# Patient Record
Sex: Male | Born: 1940
Health system: Southern US, Community
[De-identification: ages and names within clinical notes are randomized; demographics above are authoritative.]

## PROBLEM LIST (undated history)

## (undated) DIAGNOSIS — IMO0001 Reserved for inherently not codable concepts without codable children: Secondary | ICD-10-CM

## (undated) DIAGNOSIS — B86 Scabies: Secondary | ICD-10-CM

## (undated) DIAGNOSIS — I10 Essential (primary) hypertension: Secondary | ICD-10-CM

## (undated) DIAGNOSIS — R351 Nocturia: Secondary | ICD-10-CM

## (undated) DIAGNOSIS — R5383 Other fatigue: Secondary | ICD-10-CM

## (undated) DIAGNOSIS — E669 Obesity, unspecified: Secondary | ICD-10-CM

## (undated) DIAGNOSIS — N289 Disorder of kidney and ureter, unspecified: Secondary | ICD-10-CM

## (undated) DIAGNOSIS — I219 Acute myocardial infarction, unspecified: Secondary | ICD-10-CM

## (undated) DIAGNOSIS — J40 Bronchitis, not specified as acute or chronic: Secondary | ICD-10-CM

## (undated) DIAGNOSIS — J189 Pneumonia, unspecified organism: Secondary | ICD-10-CM

## (undated) DIAGNOSIS — K219 Gastro-esophageal reflux disease without esophagitis: Secondary | ICD-10-CM

## (undated) DIAGNOSIS — Z95828 Presence of other vascular implants and grafts: Secondary | ICD-10-CM

## (undated) DIAGNOSIS — M1990 Unspecified osteoarthritis, unspecified site: Secondary | ICD-10-CM

## (undated) DIAGNOSIS — E785 Hyperlipidemia, unspecified: Secondary | ICD-10-CM

## (undated) DIAGNOSIS — R972 Elevated prostate specific antigen [PSA]: Secondary | ICD-10-CM

## (undated) DIAGNOSIS — I509 Heart failure, unspecified: Secondary | ICD-10-CM

## (undated) DIAGNOSIS — R011 Cardiac murmur, unspecified: Secondary | ICD-10-CM

## (undated) DIAGNOSIS — K921 Melena: Secondary | ICD-10-CM

## (undated) HISTORY — DX: Bronchitis, not specified as acute or chronic: J40

## (undated) HISTORY — DX: Scabies: B86

## (undated) HISTORY — DX: Elevated prostate specific antigen (PSA): R97.20

## (undated) HISTORY — PX: TONSILLECTOMY: SUR1361

## (undated) HISTORY — DX: Melena: K92.1

## (undated) HISTORY — DX: Disorder of kidney and ureter, unspecified: N28.9

## (undated) HISTORY — DX: Other fatigue: R53.83

## (undated) HISTORY — DX: Nocturia: R35.1

## (undated) HISTORY — DX: Obesity, unspecified: E66.9

## (undated) HISTORY — DX: Hyperlipidemia, unspecified: E78.5

---

## 2014-06-22 ENCOUNTER — Inpatient Hospital Stay: Payer: Self-pay | Admitting: Internal Medicine

## 2014-06-22 DIAGNOSIS — I351 Nonrheumatic aortic (valve) insufficiency: Secondary | ICD-10-CM | POA: Diagnosis not present

## 2014-06-23 DIAGNOSIS — R079 Chest pain, unspecified: Secondary | ICD-10-CM | POA: Diagnosis not present

## 2014-07-04 LAB — COMPREHENSIVE METABOLIC PANEL
ALBUMIN: 2.6 g/dL — AB
ALK PHOS: 71 U/L
ANION GAP: 8 (ref 7–16)
BILIRUBIN TOTAL: 0.5 mg/dL
BUN: 21 mg/dL — ABNORMAL HIGH
CALCIUM: 8.1 mg/dL — AB
CHLORIDE: 101 mmol/L
CREATININE: 1.54 mg/dL — AB
Co2: 26 mmol/L
EGFR (Non-African Amer.): 44 — ABNORMAL LOW
GFR CALC AF AMER: 51 — AB
GLUCOSE: 139 mg/dL — AB
Potassium: 3.9 mmol/L
SGOT(AST): 35 U/L
SGPT (ALT): 29 U/L
Sodium: 135 mmol/L
TOTAL PROTEIN: 6.5 g/dL

## 2014-07-04 LAB — CK TOTAL AND CKMB (NOT AT ARMC)
CK, TOTAL: 29 U/L — AB
CK-MB: 1.9 ng/mL

## 2014-07-04 LAB — CK-MB
CK-MB: 2.2 ng/mL
CK-MB: 2.1 ng/mL

## 2014-07-04 LAB — PROTIME-INR
INR: 1.3
Prothrombin Time: 16 secs — ABNORMAL HIGH

## 2014-07-04 LAB — CBC
HCT: 28.5 % — ABNORMAL LOW (ref 40.0–52.0)
HGB: 8.9 g/dL — AB (ref 13.0–18.0)
MCH: 24.4 pg — ABNORMAL LOW (ref 26.0–34.0)
MCHC: 31.1 g/dL — AB (ref 32.0–36.0)
MCV: 79 fL — ABNORMAL LOW (ref 80–100)
Platelet: 261 10*3/uL (ref 150–440)
RBC: 3.64 10*6/uL — ABNORMAL LOW (ref 4.40–5.90)
RDW: 16.4 % — AB (ref 11.5–14.5)
WBC: 9.3 10*3/uL (ref 3.8–10.6)

## 2014-07-04 LAB — APTT: ACTIVATED PTT: 38.3 s — AB (ref 23.6–35.9)

## 2014-07-04 LAB — TROPONIN I
TROPONIN-I: 0.07 ng/mL — AB
Troponin-I: 0.08 ng/mL — ABNORMAL HIGH

## 2014-07-04 LAB — MAGNESIUM: Magnesium: 2.1 mg/dL

## 2014-07-05 ENCOUNTER — Inpatient Hospital Stay
Admit: 2014-07-05 | Disposition: A | Discharge status: Other Acute Inpatient Hospital | Payer: Self-pay | Attending: Internal Medicine | Admitting: Internal Medicine

## 2014-07-05 LAB — URINALYSIS, COMPLETE
BACTERIA: NONE SEEN
BILIRUBIN, UR: NEGATIVE
Glucose,UR: NEGATIVE mg/dL (ref 0–75)
Hyaline Cast: 30
KETONE: NEGATIVE
Nitrite: NEGATIVE
Ph: 5 (ref 4.5–8.0)
Protein: 30
RBC,UR: 26 /HPF (ref 0–5)
Specific Gravity: 1.016 (ref 1.003–1.030)

## 2014-07-05 LAB — BASIC METABOLIC PANEL
Anion Gap: 12 (ref 7–16)
BUN: 21 mg/dL — ABNORMAL HIGH
Calcium, Total: 9.2 mg/dL
Chloride: 104 mmol/L
Co2: 19 mmol/L — ABNORMAL LOW
Creatinine: 1.6 mg/dL — ABNORMAL HIGH
EGFR (African American): 48 — ABNORMAL LOW
EGFR (Non-African Amer.): 42 — ABNORMAL LOW
Glucose: 199 mg/dL — ABNORMAL HIGH
Potassium: 3.4 mmol/L — ABNORMAL LOW
Sodium: 135 mmol/L

## 2014-07-05 LAB — TROPONIN I
Troponin-I: 0.28 ng/mL — ABNORMAL HIGH
Troponin-I: 9.09 ng/mL — ABNORMAL HIGH
Troponin-I: 11.49 ng/mL — ABNORMAL HIGH
Troponin-I: 8.8 ng/mL — ABNORMAL HIGH

## 2014-07-05 LAB — LIPID PANEL
CHOLESTEROL: 89 mg/dL
HDL Cholesterol: 19 mg/dL — ABNORMAL LOW
LDL CHOLESTEROL, CALC: 47 mg/dL
Triglycerides: 114 mg/dL
VLDL Cholesterol, Calc: 23 mg/dL

## 2014-07-05 LAB — CK-MB
CK-MB: 5 ng/mL
CK-MB: 102.4 ng/mL — AB
CK-MB: 109.1 ng/mL — AB
CK-MB: 92.7 ng/mL — AB

## 2014-07-05 LAB — CBC WITH DIFFERENTIAL/PLATELET
Basophil #: 0.1 10*3/uL (ref 0.0–0.1)
Basophil %: 0.8 %
Eosinophil #: 0.1 10*3/uL (ref 0.0–0.7)
Eosinophil %: 0.4 %
HCT: 34.2 % — ABNORMAL LOW (ref 40.0–52.0)
HGB: 10.5 g/dL — ABNORMAL LOW (ref 13.0–18.0)
Lymphocyte #: 1.9 10*3/uL (ref 1.0–3.6)
Lymphocyte %: 12.2 %
MCH: 24.3 pg — ABNORMAL LOW (ref 26.0–34.0)
MCHC: 30.7 g/dL — ABNORMAL LOW (ref 32.0–36.0)
MCV: 79 fL — ABNORMAL LOW (ref 80–100)
Monocyte #: 0.3 x10 3/mm (ref 0.2–1.0)
Monocyte %: 1.8 %
Neutrophil #: 13.2 10*3/uL — ABNORMAL HIGH (ref 1.4–6.5)
Neutrophil %: 84.8 %
Platelet: 424 10*3/uL (ref 150–440)
RBC: 4.33 10*6/uL — ABNORMAL LOW (ref 4.40–5.90)
RDW: 16.7 % — ABNORMAL HIGH (ref 11.5–14.5)
WBC: 15.5 10*3/uL — ABNORMAL HIGH (ref 3.8–10.6)

## 2014-07-05 LAB — APTT
Activated PTT: 38.9 secs — ABNORMAL HIGH (ref 23.6–35.9)
Activated PTT: 87 secs — ABNORMAL HIGH (ref 23.6–35.9)

## 2014-07-05 LAB — CK: CK, Total: 37 U/L — ABNORMAL LOW

## 2014-07-05 LAB — POTASSIUM
POTASSIUM: 6.2 mmol/L — AB
POTASSIUM: 4.3 mmol/L

## 2014-07-05 LAB — HEPARIN LEVEL (UNFRACTIONATED): Anti-Xa(Unfractionated): 2 IU/mL — ABNORMAL HIGH (ref 0.30–0.70)

## 2014-07-05 LAB — MAGNESIUM
Magnesium: 2.3 mg/dL
Magnesium: 2.3 mg/dL

## 2014-07-05 LAB — PROTIME-INR
INR: 1.5
Prothrombin Time: 18.6 secs — ABNORMAL HIGH

## 2014-07-05 LAB — PRO B NATRIURETIC PEPTIDE: B-Type Natriuretic Peptide: 363 pg/mL — ABNORMAL HIGH

## 2014-07-06 ENCOUNTER — Inpatient Hospital Stay (HOSPITAL_COMMUNITY): Payer: Commercial Managed Care - HMO

## 2014-07-06 ENCOUNTER — Inpatient Hospital Stay (HOSPITAL_COMMUNITY)
Admission: AD | Admit: 2014-07-06 | Discharge: 2014-07-15 | DRG: 871 | Disposition: A | Discharge status: Home Health | Payer: Commercial Managed Care - HMO | Source: Other Acute Inpatient Hospital | Attending: Internal Medicine | Admitting: Internal Medicine

## 2014-07-06 ENCOUNTER — Encounter (HOSPITAL_COMMUNITY): Payer: Self-pay | Admitting: *Deleted

## 2014-07-06 DIAGNOSIS — T502X5A Adverse effect of carbonic-anhydrase inhibitors, benzothiadiazides and other diuretics, initial encounter: Secondary | ICD-10-CM | POA: Diagnosis present

## 2014-07-06 DIAGNOSIS — K011 Impacted teeth: Secondary | ICD-10-CM | POA: Diagnosis present

## 2014-07-06 DIAGNOSIS — I214 Non-ST elevation (NSTEMI) myocardial infarction: Secondary | ICD-10-CM | POA: Diagnosis present

## 2014-07-06 DIAGNOSIS — K089 Disorder of teeth and supporting structures, unspecified: Secondary | ICD-10-CM | POA: Insufficient documentation

## 2014-07-06 DIAGNOSIS — K5791 Diverticulosis of intestine, part unspecified, without perforation or abscess with bleeding: Secondary | ICD-10-CM | POA: Diagnosis present

## 2014-07-06 DIAGNOSIS — I429 Cardiomyopathy, unspecified: Secondary | ICD-10-CM | POA: Diagnosis present

## 2014-07-06 DIAGNOSIS — R52 Pain, unspecified: Secondary | ICD-10-CM

## 2014-07-06 DIAGNOSIS — L0291 Cutaneous abscess, unspecified: Secondary | ICD-10-CM

## 2014-07-06 DIAGNOSIS — K029 Dental caries, unspecified: Secondary | ICD-10-CM | POA: Diagnosis present

## 2014-07-06 DIAGNOSIS — R55 Syncope and collapse: Secondary | ICD-10-CM | POA: Diagnosis present

## 2014-07-06 DIAGNOSIS — I2510 Atherosclerotic heart disease of native coronary artery without angina pectoris: Secondary | ICD-10-CM | POA: Diagnosis present

## 2014-07-06 DIAGNOSIS — K0530 Chronic periodontitis, unspecified: Secondary | ICD-10-CM | POA: Diagnosis present

## 2014-07-06 DIAGNOSIS — M25561 Pain in right knee: Secondary | ICD-10-CM | POA: Diagnosis present

## 2014-07-06 DIAGNOSIS — J181 Lobar pneumonia, unspecified organism: Secondary | ICD-10-CM

## 2014-07-06 DIAGNOSIS — R7881 Bacteremia: Secondary | ICD-10-CM | POA: Diagnosis present

## 2014-07-06 DIAGNOSIS — B954 Other streptococcus as the cause of diseases classified elsewhere: Secondary | ICD-10-CM | POA: Diagnosis not present

## 2014-07-06 DIAGNOSIS — Z880 Allergy status to penicillin: Secondary | ICD-10-CM | POA: Diagnosis not present

## 2014-07-06 DIAGNOSIS — E785 Hyperlipidemia, unspecified: Secondary | ICD-10-CM | POA: Diagnosis present

## 2014-07-06 DIAGNOSIS — M2570 Osteophyte, unspecified joint: Secondary | ICD-10-CM | POA: Diagnosis present

## 2014-07-06 DIAGNOSIS — I5022 Chronic systolic (congestive) heart failure: Secondary | ICD-10-CM | POA: Diagnosis not present

## 2014-07-06 DIAGNOSIS — J189 Pneumonia, unspecified organism: Secondary | ICD-10-CM | POA: Diagnosis present

## 2014-07-06 DIAGNOSIS — I10 Essential (primary) hypertension: Secondary | ICD-10-CM | POA: Diagnosis present

## 2014-07-06 DIAGNOSIS — I339 Acute and subacute endocarditis, unspecified: Secondary | ICD-10-CM

## 2014-07-06 DIAGNOSIS — E876 Hypokalemia: Secondary | ICD-10-CM | POA: Diagnosis present

## 2014-07-06 DIAGNOSIS — M009 Pyogenic arthritis, unspecified: Secondary | ICD-10-CM | POA: Diagnosis present

## 2014-07-06 DIAGNOSIS — I4891 Unspecified atrial fibrillation: Secondary | ICD-10-CM | POA: Diagnosis present

## 2014-07-06 DIAGNOSIS — E43 Unspecified severe protein-calorie malnutrition: Secondary | ICD-10-CM | POA: Diagnosis present

## 2014-07-06 DIAGNOSIS — D649 Anemia, unspecified: Secondary | ICD-10-CM | POA: Diagnosis present

## 2014-07-06 DIAGNOSIS — Z48812 Encounter for surgical aftercare following surgery on the circulatory system: Secondary | ICD-10-CM | POA: Diagnosis not present

## 2014-07-06 DIAGNOSIS — M25569 Pain in unspecified knee: Secondary | ICD-10-CM | POA: Diagnosis not present

## 2014-07-06 DIAGNOSIS — Z01810 Encounter for preprocedural cardiovascular examination: Secondary | ICD-10-CM | POA: Diagnosis not present

## 2014-07-06 DIAGNOSIS — A419 Sepsis, unspecified organism: Principal | ICD-10-CM | POA: Diagnosis present

## 2014-07-06 DIAGNOSIS — G8929 Other chronic pain: Secondary | ICD-10-CM | POA: Diagnosis present

## 2014-07-06 DIAGNOSIS — I272 Other secondary pulmonary hypertension: Secondary | ICD-10-CM | POA: Diagnosis present

## 2014-07-06 DIAGNOSIS — I351 Nonrheumatic aortic (valve) insufficiency: Secondary | ICD-10-CM

## 2014-07-06 DIAGNOSIS — K045 Chronic apical periodontitis: Secondary | ICD-10-CM | POA: Diagnosis present

## 2014-07-06 DIAGNOSIS — I330 Acute and subacute infective endocarditis: Secondary | ICD-10-CM | POA: Diagnosis present

## 2014-07-06 DIAGNOSIS — I252 Old myocardial infarction: Secondary | ICD-10-CM

## 2014-07-06 DIAGNOSIS — I358 Other nonrheumatic aortic valve disorders: Secondary | ICD-10-CM | POA: Diagnosis not present

## 2014-07-06 DIAGNOSIS — R011 Cardiac murmur, unspecified: Secondary | ICD-10-CM | POA: Diagnosis present

## 2014-07-06 DIAGNOSIS — I481 Persistent atrial fibrillation: Secondary | ICD-10-CM | POA: Diagnosis not present

## 2014-07-06 DIAGNOSIS — R634 Abnormal weight loss: Secondary | ICD-10-CM | POA: Diagnosis not present

## 2014-07-06 DIAGNOSIS — N179 Acute kidney failure, unspecified: Secondary | ICD-10-CM | POA: Diagnosis present

## 2014-07-06 DIAGNOSIS — K088 Other specified disorders of teeth and supporting structures: Secondary | ICD-10-CM | POA: Diagnosis not present

## 2014-07-06 DIAGNOSIS — I480 Paroxysmal atrial fibrillation: Secondary | ICD-10-CM | POA: Diagnosis not present

## 2014-07-06 DIAGNOSIS — I340 Nonrheumatic mitral (valve) insufficiency: Secondary | ICD-10-CM | POA: Diagnosis not present

## 2014-07-06 DIAGNOSIS — I509 Heart failure, unspecified: Secondary | ICD-10-CM | POA: Diagnosis not present

## 2014-07-06 DIAGNOSIS — A491 Streptococcal infection, unspecified site: Secondary | ICD-10-CM | POA: Diagnosis present

## 2014-07-06 DIAGNOSIS — I39 Endocarditis and heart valve disorders in diseases classified elsewhere: Secondary | ICD-10-CM | POA: Diagnosis present

## 2014-07-06 DIAGNOSIS — Z951 Presence of aortocoronary bypass graft: Secondary | ICD-10-CM

## 2014-07-06 DIAGNOSIS — I083 Combined rheumatic disorders of mitral, aortic and tricuspid valves: Secondary | ICD-10-CM | POA: Diagnosis present

## 2014-07-06 DIAGNOSIS — J9601 Acute respiratory failure with hypoxia: Secondary | ICD-10-CM | POA: Diagnosis present

## 2014-07-06 DIAGNOSIS — I482 Chronic atrial fibrillation: Secondary | ICD-10-CM | POA: Diagnosis not present

## 2014-07-06 HISTORY — DX: Heart failure, unspecified: I50.9

## 2014-07-06 HISTORY — DX: Reserved for inherently not codable concepts without codable children: IMO0001

## 2014-07-06 HISTORY — DX: Unspecified osteoarthritis, unspecified site: M19.90

## 2014-07-06 HISTORY — DX: Essential (primary) hypertension: I10

## 2014-07-06 HISTORY — DX: Acute myocardial infarction, unspecified: I21.9

## 2014-07-06 LAB — CBC WITH DIFFERENTIAL/PLATELET
Basophil #: 0.1 10*3/uL (ref 0.0–0.1)
Basophil %: 0.6 %
EOS ABS: 0 10*3/uL (ref 0.0–0.7)
Eosinophil %: 0.1 %
HCT: 30.1 % — AB (ref 40.0–52.0)
HGB: 9.3 g/dL — ABNORMAL LOW (ref 13.0–18.0)
Lymphocyte #: 0.6 10*3/uL — ABNORMAL LOW (ref 1.0–3.6)
Lymphocyte %: 4.2 %
MCH: 24.3 pg — ABNORMAL LOW (ref 26.0–34.0)
MCHC: 31.1 g/dL — AB (ref 32.0–36.0)
MCV: 78 fL — AB (ref 80–100)
MONO ABS: 0.7 x10 3/mm (ref 0.2–1.0)
Monocyte %: 4.5 %
NEUTROS ABS: 13.1 10*3/uL — AB (ref 1.4–6.5)
Neutrophil %: 90.6 %
Platelet: 269 10*3/uL (ref 150–440)
RBC: 3.84 10*6/uL — ABNORMAL LOW (ref 4.40–5.90)
RDW: 16.5 % — ABNORMAL HIGH (ref 11.5–14.5)
WBC: 14.5 10*3/uL — ABNORMAL HIGH (ref 3.8–10.6)

## 2014-07-06 LAB — BASIC METABOLIC PANEL
Anion Gap: 3 — ABNORMAL LOW (ref 7–16)
BUN: 30 mg/dL — ABNORMAL HIGH
CALCIUM: 7.9 mg/dL — AB
CHLORIDE: 110 mmol/L
CO2: 23 mmol/L
Creatinine: 1.65 mg/dL — ABNORMAL HIGH
GFR CALC AF AMER: 47 — AB
GFR CALC NON AF AMER: 40 — AB
Glucose: 103 mg/dL — ABNORMAL HIGH
POTASSIUM: 3.9 mmol/L
Sodium: 136 mmol/L

## 2014-07-06 LAB — MAGNESIUM: Magnesium: 2.1 mg/dL (ref 1.5–2.5)

## 2014-07-06 LAB — TROPONIN I: TROPONIN I: 4.03 ng/mL — AB (ref <0.031)

## 2014-07-06 LAB — PROTIME-INR
INR: 1.22 (ref 0.00–1.49)
PROTHROMBIN TIME: 15.6 s — AB (ref 11.6–15.2)

## 2014-07-06 LAB — APTT
Activated PTT: 59.3 secs — ABNORMAL HIGH (ref 23.6–35.9)
Activated PTT: 77.7 secs — ABNORMAL HIGH (ref 23.6–35.9)
aPTT: 59 seconds — ABNORMAL HIGH (ref 24–37)

## 2014-07-06 LAB — TSH: TSH: 2.343 u[IU]/mL (ref 0.350–4.500)

## 2014-07-06 LAB — BRAIN NATRIURETIC PEPTIDE: B Natriuretic Peptide: 779.9 pg/mL — ABNORMAL HIGH (ref 0.0–100.0)

## 2014-07-06 LAB — PHOSPHORUS: Phosphorus: 3.2 mg/dL

## 2014-07-06 LAB — HEPARIN LEVEL (UNFRACTIONATED): Anti-Xa(Unfractionated): >1.1 IU/mL (ref 0.30–0.70)

## 2014-07-06 MED ORDER — SODIUM CHLORIDE 0.9 % IJ SOLN
3.0000 mL | Freq: Two times a day (BID) | INTRAMUSCULAR | Status: DC
  Administered 2014-07-06 – 2014-07-15 (×14): 3 mL via INTRAVENOUS

## 2014-07-06 MED ORDER — ONDANSETRON HCL 4 MG/2ML IJ SOLN
4.0000 mg | Freq: Four times a day (QID) | INTRAMUSCULAR | Status: DC | PRN
  Administered 2014-07-09: 4 mg via INTRAVENOUS
  Filled 2014-07-06: qty 2

## 2014-07-06 MED ORDER — METOPROLOL TARTRATE 25 MG PO TABS
25.0000 mg | ORAL_TABLET | Freq: Two times a day (BID) | ORAL | Status: DC
  Administered 2014-07-06 – 2014-07-15 (×17): 25 mg via ORAL
  Filled 2014-07-06 (×18): qty 1

## 2014-07-06 MED ORDER — AMIODARONE HCL 200 MG PO TABS
200.0000 mg | ORAL_TABLET | Freq: Every day | ORAL | Status: DC
  Administered 2014-07-07 – 2014-07-15 (×9): 200 mg via ORAL
  Filled 2014-07-06 (×9): qty 1

## 2014-07-06 MED ORDER — HEPARIN (PORCINE) IN NACL 100-0.45 UNIT/ML-% IJ SOLN
1400.0000 [IU]/h | INTRAMUSCULAR | Status: DC
  Administered 2014-07-06: 1150 [IU]/h via INTRAVENOUS
  Administered 2014-07-07 – 2014-07-08 (×2): 1250 [IU]/h via INTRAVENOUS
  Administered 2014-07-09: 1300 [IU]/h via INTRAVENOUS
  Administered 2014-07-09: 1400 [IU]/h via INTRAVENOUS
  Filled 2014-07-06 (×4): qty 250

## 2014-07-06 MED ORDER — ACETAMINOPHEN 325 MG PO TABS: 650.0000 mg | ORAL_TABLET | ORAL | Status: DC | PRN

## 2014-07-06 MED ORDER — ASPIRIN EC 81 MG PO TBEC
81.0000 mg | DELAYED_RELEASE_TABLET | Freq: Every day | ORAL | Status: DC
  Administered 2014-07-07 – 2014-07-11 (×5): 81 mg via ORAL
  Filled 2014-07-06 (×5): qty 1

## 2014-07-06 MED ORDER — VANCOMYCIN HCL IN DEXTROSE 750-5 MG/150ML-% IV SOLN
750.0000 mg | Freq: Two times a day (BID) | INTRAVENOUS | Status: DC
  Administered 2014-07-06 – 2014-07-15 (×18): 750 mg via INTRAVENOUS
  Filled 2014-07-06 (×20): qty 150

## 2014-07-06 MED ORDER — DIGOXIN 125 MCG PO TABS
125.0000 ug | ORAL_TABLET | Freq: Every day | ORAL | Status: DC
  Administered 2014-07-07 – 2014-07-15 (×9): 125 ug via ORAL
  Filled 2014-07-06 (×9): qty 1

## 2014-07-06 MED ORDER — ATORVASTATIN CALCIUM 40 MG PO TABS
40.0000 mg | ORAL_TABLET | Freq: Every day | ORAL | Status: DC
  Administered 2014-07-06 – 2014-07-12 (×7): 40 mg via ORAL
  Filled 2014-07-06 (×7): qty 1

## 2014-07-06 MED ORDER — ASPIRIN 81 MG PO CHEW: 324.0000 mg | CHEWABLE_TABLET | ORAL | Status: AC

## 2014-07-06 MED ORDER — ASPIRIN 300 MG RE SUPP: 300.0000 mg | RECTAL | Status: AC

## 2014-07-06 MED ORDER — SODIUM CHLORIDE 0.9 % IV SOLN
250.0000 mL | INTRAVENOUS | Status: DC | PRN
  Administered 2014-07-06 – 2014-07-08 (×2): 250 mL via INTRAVENOUS

## 2014-07-06 MED ORDER — ENSURE ENLIVE PO LIQD
237.0000 mL | Freq: Two times a day (BID) | ORAL | Status: DC
  Administered 2014-07-07 – 2014-07-15 (×13): 237 mL via ORAL

## 2014-07-06 MED ORDER — SODIUM CHLORIDE 0.9 % IJ SOLN: 3.0000 mL | INTRAMUSCULAR | Status: DC | PRN

## 2014-07-06 MED ORDER — LEVOFLOXACIN 750 MG PO TABS: 750.0000 mg | ORAL_TABLET | ORAL | Status: DC

## 2014-07-06 MED ORDER — NITROGLYCERIN 0.4 MG SL SUBL
0.4000 mg | SUBLINGUAL_TABLET | SUBLINGUAL | Status: DC | PRN
  Administered 2014-07-13: 0.4 mg via SUBLINGUAL
  Filled 2014-07-06: qty 1

## 2014-07-06 MED ORDER — LEVOFLOXACIN 750 MG PO TABS
750.0000 mg | ORAL_TABLET | Freq: Every day | ORAL | Status: DC
Start: 2014-07-07 — End: 2014-07-06

## 2014-07-06 NOTE — Progress Notes (Signed)
ANTIBIOTIC CONSULT NOTE - INITIAL  Pharmacy Consult for vancomycin  Indication: bacteremia   Heparin for ACS/afib  Allergies  Allergen Reactions  . Penicillins     Unable to say what kind of reaction    Patient Measurements: Height: 6' (182.9 cm) IBW/kg (Calculated) : 77.6   Vital Signs: Temp: 98.1 F (36.7 C) (03/31 1525) BP: 108/38 mmHg (03/31 1527) Intake/Output from previous day:   Intake/Output from this shift:    Labs: No results for input(s): WBC, HGB, PLT, LABCREA, CREATININE in the last 72 hours. CrCl cannot be calculated (Unknown ideal weight.). No results for input(s): VANCOTROUGH, VANCOPEAK, VANCORANDOM, GENTTROUGH, GENTPEAK, GENTRANDOM, TOBRATROUGH, TOBRAPEAK, TOBRARND, AMIKACINPEAK, AMIKACINTROU, AMIKACIN in the last 72 hours.   Microbiology: No results found for this or any previous visit (from the past 720 hour(s)).  Medical History: Past Medical History  Diagnosis Date  . Hypertension   . Myocardial infarction    Assessment: 74 year old male transferred from [facility] to [facility] for further cardiac evaluation and possible CABG.   1. AFib - This patients CHA2DS2-VASc Score and unadjusted Ischemic Stroke Rate (% per year) is equal to 4.8 % stroke rate/year from a score of 4  Above score calculated as 1 point each if present [CHF, HTN, DM, Vascular=MI/PAD/Aortic Plaque, Age if 65-74, or Male]  Patient started on heparin 3/30 per Rx at [facility], he was taking eliquis pta (last dose 3/29 am per wife) according to pharmacy at [facility] his HL was elevated at 1.1 this am likely d/t eliquis. No bleeding issues noted. Heparin running at 1150 while I visited. Will check stat aptt now and dose based on these results.  2.R/o bacteremia - patient was given 1g vancomycin and levaquin this morning. GPC noted in 1 bld cx at [facility] - await speciation. Labs at [facility] pending, scr from this am was 1.6.  Goal of Therapy:  Vancomycin trough level 15-20 mcg/ml  Plan:  Measure  antibiotic drug levels at steady state Follow up culture results Continue Levaquin 750mg  q48 hours to cover 5d of therapy  Vancomycin 750mg  IV q12 hours - next dose tonight Continue heparin at 1150/hr Check Stat aptt and adjust accordingly Daily HL/aptt until HL correlates  Erin Hearing PharmD., BCPS Clinical Pharmacist Pager [phone] 07/06/2014 5:33 PM

## 2014-07-06 NOTE — Progress Notes (Signed)
ANTICOAGULATION CONSULT NOTE - Follow Up Consult  Pharmacy Consult for heparin Indication: ACS/afib  Allergies  Allergen Reactions  . Penicillins     Unable to say what kind of reaction    Patient Measurements: Height: 6' (182.9 cm) Weight: 190 lb 8 oz (86.41 kg) IBW/kg (Calculated) : 77.6 Heparin Dosing Weight: 86kg  Vital Signs: Temp: 98.2 F (36.8 C) (03/31 2011) Temp Source: Oral (03/31 2011) BP: 134/37 mmHg (03/31 2011)  Labs:  Recent Labs  07/06/14 1940  APTT 59*  LABPROT 15.6*  INR 1.22    CrCl cannot be calculated (Patient has no serum creatinine result on file.).   Assessment: Patient started on heparin 3/30 per Rx at [facility], he was taking eliquis pta for afib (last dose 3/29 am per wife) according to pharmacy at [facility] his HL was elevated at 1.1 this am likely d/t eliquis.   Aptt check on arrival to [facility] was low at 59, no bleeding or IV issues noted. Will adjust rate and recheck coags in am.   Goal of Therapy:  aPTT 66-102 seconds Monitor platelets by anticoagulation protocol: Yes   Plan:  Increase heparin to 1300 units/hr Check HL/aptt in am  Erin Hearing PharmD., BCPS Clinical Pharmacist Pager [phone] 07/06/2014 8:57 PM

## 2014-07-06 NOTE — Progress Notes (Signed)
CRITICAL VALUE ALERT  Critical value received:  Troponin 4.03  Date of notification:  07/06/2014   Time of notification:  2225  Critical value read back: yes  Nurse who received alert:  Aggie Cosier RN  MD notified (1st page):  Dr. Philbert Riser  Time of first page:  2235  MD notified (2nd page):  Time of second page:  Responding MD:  Dr. Philbert Riser  Time MD responded:  2238

## 2014-07-06 NOTE — Progress Notes (Signed)
[facility] PROGRESS NOTE   Joe Kennedy TXM:[id number] DOB: 08-Dec-1940 DOA: 07/06/2014 PCP: No primary care provider on file.  HPI/Subjective: Unable to see the patient as patient in the radiology suite.  Assessment/Plan: Active Problems:   NSTEMI (non-ST elevated myocardial infarction)   Bacteremia   Atrial fibrillation   Chronic systolic CHF (congestive heart failure)   Right lower lobe pneumonia   Non-STEMI Patient presented to [facility] with syncope. Troponin went up to 11, admitted under cardiology initially. Because patient has bacteremia, right lower negative pneumonia, acute renal failure will be transferred to internal medicine service in a.m. Patient is on heparin, management per cardiology likely to repeat cardiac cath when creatinine improving.  GPC Bacteremia  According to results from [facility] gram-positive cocci bacteremia in 2 bottles. This is likely secondary to pneumonia, patient already on vancomycin. I have asked pharmacy to dose the vancomycin. Follow the final culture results.  Right lower lobe pneumonia X-rays from [place] [facility] yesterday showed probable RLL. Cannot rule out pulmonary edema, watch fluids.  Atrial fibrillation Patient was on Eliquis as outpatient, currently on heparin drip for the non-STEMI. Rate is controlled, EKG pending.    Code Status: Full Code Family Communication: Plan discussed with the patient. Disposition Plan: Remains inpatient Diet: Diet Heart Room service appropriate?: Yes; Fluid consistency:: Thin  Consultants:  Cardiology  Procedures:  None  Antibiotics:  Vancomycin   Objective: Filed Vitals:   07/06/14 1527  BP: 108/38  Temp:    No intake or output data in the 24 hours ending 07/06/14 1751 Filed Weights   07/06/14 1525  Weight: 86.41 kg (190 lb 8 oz)    Exam: Patient was not examined  Data Reviewed: Basic Metabolic Panel: No results for input(s):  NA, K, CL, CO2, GLUCOSE, BUN, CREATININE, CALCIUM, MG, PHOS in the last 168 hours. Liver Function Tests: No results for input(s): AST, ALT, ALKPHOS, BILITOT, PROT, ALBUMIN in the last 168 hours. No results for input(s): LIPASE, AMYLASE in the last 168 hours. No results for input(s): AMMONIA in the last 168 hours. CBC: No results for input(s): WBC, NEUTROABS, HGB, HCT, MCV, PLT in the last 168 hours. Cardiac Enzymes: No results for input(s): CKTOTAL, CKMB, CKMBINDEX, TROPONINI in the last 168 hours. BNP (last 3 results) No results for input(s): BNP in the last 8760 hours.  ProBNP (last 3 results) No results for input(s): PROBNP in the last 8760 hours.  CBG: No results for input(s): GLUCAP in the last 168 hours.  Micro No results found for this or any previous visit (from the past 240 hour(s)).   Studies: No results found.  Scheduled Meds: . [START ON 07/07/2014] amiodarone  200 mg Oral Daily  . aspirin  324 mg Oral NOW   Or  . aspirin  300 mg Rectal NOW  . [START ON 07/07/2014] aspirin EC  81 mg Oral Daily  . atorvastatin  40 mg Oral QHS  . [START ON 07/07/2014] digoxin  125 mcg Oral Daily  . [START ON 07/07/2014] feeding supplement (ENSURE ENLIVE)  237 mL Oral BID BM  . [START ON 07/07/2014] levofloxacin  750 mg Oral Q48H  . metoprolol tartrate  25 mg Oral BID  . sodium chloride  3 mL Intravenous Q12H  . vancomycin  750 mg Intravenous Q12H   Continuous Infusions: . heparin         Time spent: 35 minutes    Woodson Macha A  [facility] Pager [phone] If 7PM-7AM, please contact night-coverage at  [email or web address], password [facility] 07/06/2014, 5:51 PM  LOS: 0 days

## 2014-07-06 NOTE — H&P (Signed)
Patient ID: Joe Kennedy MRN: [id number], DOB/AGE: 75-May-1942   Admit date: 07/06/2014   Primary Physician: No primary care provider on file. Primary Cardiologist: Dr. Clayborn Bigness  Pt. Profile:  Joe Kennedy is a 74 y.o. male with a history of atrial fibrillation on Eliquis, CAD, HLD, chronic systolic heart failure (EF 35%), chronic anemia, and HTN who presented to [facility] with a syncopal episode on 07/04/2014. He eventually ruled in for NSTEMI and was transferred to [facility] today for evaluation by CTCS.  He is followed by Dr. Clayborn Bigness for cardiology. He was hospitalized earlier in March with angina, NSTEMI and had a cardiac catheterization, which showed two-vessel disease not amenable to intervention. Medical management was recommended. He was sent home on medical therapy.   He re-presented to [facility] on 07/04/14 with syncope while at his PCP's office. This hospitalization was further complicated by hypotension, right lower lobe pneumonia, acute hypoxic respiratory failure requiring BiPAP, acute renal failure, bacteremia ( gram + cocci), afib with RVR  and NSTEMI.  During his admission he developed further chest pain and his troponin was noted to be elevated and peaked around 11. His cardiologist, Dr. Clayborn Bigness, was notified and he recommended CABG. He was transferred to [facility] for further evaluation. He was transferred on IV heparin and IV Vanc.   He is not currently having any chest pain and if feeling better. History is difficult to obtain as he was given a xanax before transfer which made him quite sedated. No SOB, orthopnea, PND or LE edema.    Problem List  Past Medical History  Diagnosis Date  . Hypertension   . Myocardial infarction     No past surgical history on file.   Allergies  Allergies  Allergen Reactions  . Penicillins     Unable to say what kind of reaction     Home Medications  Prior to Admission medications   Medication Sig Start  Date End Date Taking? Authorizing Provider  amiodarone (PACERONE) 200 MG tablet Take 200 mg by mouth daily. 06/23/14   Historical Provider, MD  atorvastatin (LIPITOR) 40 MG tablet Take 40 mg by mouth daily. 06/23/14   Historical Provider, MD  digoxin (LANOXIN) 0.125 MG tablet Take 125 mcg by mouth daily. 06/17/14   Historical Provider, MD  ELIQUIS 5 MG TABS tablet Take 5 mg by mouth 2 (two) times daily. 06/23/14   Historical Provider, MD  etodolac (LODINE) 500 MG tablet Take 500 mg by mouth 2 (two) times daily. 06/01/14   Historical Provider, MD  isosorbide mononitrate (IMDUR) 30 MG 24 hr tablet Take 30 mg by mouth every morning. 06/23/14   Historical Provider, MD  lisinopril (PRINIVIL,ZESTRIL) 10 MG tablet Take 10 mg by mouth daily. 06/23/14   Historical Provider, MD  metoprolol tartrate (LOPRESSOR) 25 MG tablet Take 25 mg by mouth 2 (two) times daily. 03/30/14   Historical Provider, MD  NITROSTAT 0.4 MG SL tablet Take 0.4 mg by mouth. Dissolve 1 tablet under the tongue every 5 minutes as needed for chest pain up to 3 times. 06/23/14   Historical Provider, MD    Family History  Family History  Problem Relation Age of Onset  . Coronary artery disease Brother    Family Status  Relation Status Death Age  . Mother Deceased   . Father Deceased   . Sister Alive   . Brother Alive      Social History  History   Social History  .  Marital Status: Married    Spouse Name: N/A  . Number of Children: N/A  . Years of Education: N/A   Occupational History  . Not on file.   Social History Main Topics  . Smoking status: Never Smoker   . Smokeless tobacco: Not on file  . Alcohol Use: 0.6 oz/week    1 Cans of beer per week  . Drug Use: Not on file  . Sexual Activity: Not on file   Other Topics Concern  . Not on file   Social History Narrative  . No narrative on file      All other systems reviewed and are otherwise negative except as noted above.  Physical Exam  Blood pressure  108/38, temperature 98.1 F (36.7 C), height 6' (1.829 m), SpO2 100 %.  General: Pleasant, NAD Psych: Normal affect. Neuro: Alert and oriented X 3. Moves all extremities spontaneously. HEENT: Normal  Neck: Supple without bruits or JVD. Lungs:  Resp regular and unlabored, CTA. Mild rales at bases Heart: RRR no s3, s4, or 1-9/4 diastolic murmur. Abdomen: Soft, non-tender, non-distended, BS + x 4.  Extremities: No clubbing, cyanosis or edema. DP/PT/Radials 2+ and equal bilaterally. Right knee swollen   Labs  No results for input(s): CKTOTAL, CKMB, TROPONINI in the last 72 hours. No results found for: WBC, HGB, HCT, MCV, PLT No results for input(s): NA, K, CL, CO2, BUN, CREATININE, CALCIUM, PROT, BILITOT, ALKPHOS, ALT, AST, GLUCOSE in the last 168 hours.  Invalid input(s): LABALBU No results found for: CHOL, HDL, LDLCALC, TRIG No results found for: DDIMER   Radiology/Studies  No results found.  ECG  None   ASSESSMENT AND PLAN Joe Kennedy is a 74 y.o. male with a history of atrial fibrillation on Eliquis, CAD, HLD, chronic systolic heart failure (EF 35%), chronic anemia, and HTN who presented to [facility] with a syncopal episode on 07/04/2014. This hospitalization was further complicated by hypotension, right lower lobe pneumonia, acute hypoxic respiratory failure requiring BiPAP, acute renal failure, bacteremia ( gram + cocci), afib with RVR  and NSTEMI.  He was seen by his cardiologist, Dr Clayborn Bigness, who recommended transfer to [facility] today for evaluation by CTCS for possible CABG.  NSTEMI- pk troponin ~ 11. Recent NSTEMI with [facility] with two-vessel disease not amenable to intervention (early march). Medical management was recommended.  He then was found to have recurrent chest pain and NSTEMI during a subsequent hospitalization for syncope. Transferred from [facility] to [facility] for evaluation for CABG.  -- Continue heparin gtt.  -- Will continue to cycle enzymes and serial ECGs -- Will  consult CTCS; however, case further complicated by positive blood cultures. Cath may need to be repeated. Dr. Acie Fredrickson to review films with interventional cardiologist.   Chronic systolic CHF- EF 17% by LV gram on last heart cath.  -- Will obtain a 2D ECHO -- He appears euvolemic currently  -- Continue BB. ACE currently held with recent AKI and hypotension.   Syncopal episode- felt to be due to hypotension. He improved with holding antihypertensives and IVFs. -- Monitor on tele  RLL PNA- continue abx he has 5 more days of levofloxacin 750mg  qd -- Acute respiratory failure now resolved.  -- Will repeat CXR  G+ cocci bacteremia- started on 1000mg  IV vanc today. Will ask pharmacy to help dose -- Will obtain an IM consult. I have placed an order for repeat blood cultures x2  Atrial fibrillation- currently in NSR. Continue amiodarone 200 mg and digoxin 0.125  mg. Eliquis stopped and placed on heparin gtt in the setting of NSTEMI.   HTN- blood pressure stable but soft. Holding lisinopril and imdur.   Acute renal failure- felt to be pre-renal. Given IVFs. Will repeat a BMET today.    Dispo- this patient is very complicated and I spoke with Dr. Hartford Poli who recommended that IM take over care of patient as primary service. We appreciate their help.  Judy Pimple, PA-C 07/06/2014, 4:46 PM  Pager [phone]   Attending Note:   The patient was seen and examined.  Agree with assessment and plan as noted above.  Changes made to the above note as needed.  1. CAD :  i have reviewed the angiograms with Dr. Angelena Form.  The LAD has moderate disease but is not seen very well ijn all views.  The LCx has a tight stenosis but is not a good target for CABG - it primarily fills a small , subtotalled OM ( that already has collateral flow).  The RCA has distal disease and could potentially be stented .   The LV has anterior apical akinesis - which is not explained by moderate LAD disease.   He  will need a repeat cardiac cath for further evaluatioin.   At present, he was just found to have + blood cultures and will need IV antibiotics prior to doing anything with the heart.   He is pain free at present.   2. Bacteremia:  The initial reports from [facility] show [place] in the aerobic and anaerobic bottles.  He was started on Vanc today. We have placed a pharmacy consult.  Will need to get the final results from [facility].  Wiill get a Int. Med consult to help with this   3. ? Pneumonia:  Will ask [facility] to help [place] with management of this .  4. Aortic insufficiency:  By exam.  Echo to be done  5. Chronic systolic chf:  EF ~79-03 %.  Continue CHF meds as tolerated .Marland Kitchen Restart ARB when renal function improves.   6.    Thayer Headings, Brooke Bonito., MD, [facility] 07/06/2014, 5:36 PM [address],  [place] Pager [phone]

## 2014-07-07 ENCOUNTER — Encounter (HOSPITAL_COMMUNITY): Payer: Self-pay | Admitting: General Practice

## 2014-07-07 DIAGNOSIS — I482 Chronic atrial fibrillation: Secondary | ICD-10-CM

## 2014-07-07 DIAGNOSIS — J189 Pneumonia, unspecified organism: Secondary | ICD-10-CM

## 2014-07-07 LAB — COMPREHENSIVE METABOLIC PANEL
ALT: 21 U/L (ref 0–53)
AST: 31 U/L (ref 0–37)
Albumin: 1.9 g/dL — ABNORMAL LOW (ref 3.5–5.2)
Alkaline Phosphatase: 64 U/L (ref 39–117)
Anion gap: 4 — ABNORMAL LOW (ref 5–15)
BUN: 20 mg/dL (ref 6–23)
CO2: 25 mmol/L (ref 19–32)
Calcium: 8.1 mg/dL — ABNORMAL LOW (ref 8.4–10.5)
Chloride: 110 mmol/L (ref 96–112)
Creatinine, Ser: 1.31 mg/dL (ref 0.50–1.35)
GFR calc Af Amer: 60 mL/min — ABNORMAL LOW (ref >90)
GFR, EST NON AFRICAN AMERICAN: 52 mL/min — AB (ref >90)
Glucose, Bld: 81 mg/dL (ref 70–99)
Potassium: 3.2 mmol/L — ABNORMAL LOW (ref 3.5–5.1)
SODIUM: 139 mmol/L (ref 135–145)
TOTAL PROTEIN: 5.3 g/dL — AB (ref 6.0–8.3)
Total Bilirubin: 0.6 mg/dL (ref 0.3–1.2)

## 2014-07-07 LAB — CBC
HCT: 27.9 % — ABNORMAL LOW (ref 39.0–52.0)
Hemoglobin: 8.6 g/dL — ABNORMAL LOW (ref 13.0–17.0)
MCH: 24.3 pg — ABNORMAL LOW (ref 26.0–34.0)
MCHC: 30.8 g/dL (ref 30.0–36.0)
MCV: 78.8 fL (ref 78.0–100.0)
PLATELETS: 295 10*3/uL (ref 150–400)
RBC: 3.54 MIL/uL — ABNORMAL LOW (ref 4.22–5.81)
RDW: 16.4 % — AB (ref 11.5–15.5)
WBC: 9.1 10*3/uL (ref 4.0–10.5)

## 2014-07-07 LAB — LIPID PANEL
CHOLESTEROL: 82 mg/dL (ref 0–200)
HDL: 18 mg/dL — ABNORMAL LOW (ref >39)
LDL Cholesterol: 43 mg/dL (ref 0–99)
TRIGLYCERIDES: 103 mg/dL (ref <150)
Total CHOL/HDL Ratio: 4.6 RATIO
VLDL: 21 mg/dL (ref 0–40)

## 2014-07-07 LAB — DIGOXIN LEVEL: Digoxin Level: <0.2 ng/mL — ABNORMAL LOW (ref 0.8–2.0)

## 2014-07-07 LAB — HEPARIN LEVEL (UNFRACTIONATED): HEPARIN UNFRACTIONATED: 0.58 [IU]/mL (ref 0.30–0.70)

## 2014-07-07 LAB — PROTIME-INR
INR: 1.21 (ref 0.00–1.49)
PROTHROMBIN TIME: 15.5 s — AB (ref 11.6–15.2)

## 2014-07-07 LAB — TROPONIN I
Troponin I: 3.46 ng/mL (ref <0.031)
Troponin I: 3.11 ng/mL (ref <0.031)

## 2014-07-07 LAB — APTT: APTT: 102 s — AB (ref 24–37)

## 2014-07-07 MED ORDER — LEVOFLOXACIN 750 MG PO TABS
750.0000 mg | ORAL_TABLET | Freq: Every day | ORAL | Status: DC
Start: 2014-07-07 — End: 2014-07-08
  Administered 2014-07-07 – 2014-07-08 (×2): 750 mg via ORAL
  Filled 2014-07-07 (×2): qty 1

## 2014-07-07 MED ORDER — POTASSIUM CHLORIDE CRYS ER 20 MEQ PO TBCR
20.0000 meq | EXTENDED_RELEASE_TABLET | Freq: Three times a day (TID) | ORAL | Status: AC
  Administered 2014-07-07 (×3): 20 meq via ORAL
  Filled 2014-07-07 (×2): qty 1

## 2014-07-07 MED ORDER — CALCIUM CARBONATE ANTACID 500 MG PO CHEW
1.0000 | CHEWABLE_TABLET | Freq: Three times a day (TID) | ORAL | Status: DC | PRN
  Administered 2014-07-07 – 2014-07-11 (×2): 200 mg via ORAL
  Filled 2014-07-07 (×3): qty 1

## 2014-07-07 NOTE — Progress Notes (Signed)
Patient Name: Joe Kennedy Date of Encounter: 07/07/2014  Principal Problem:   NSTEMI (non-ST elevated myocardial infarction) Active Problems:   Bacteremia   Atrial fibrillation   Chronic systolic CHF (congestive heart failure)   Right lower lobe pneumonia   Primary Cardiologist: Dr Clayborn Bigness  Patient Profile: 74 y.o. male with a history of atrial fibrillation on Eliquis, CAD w/ med rx by recent cath at [facility], [place], chronic S-CHF (EF 35%), chronic anemia, and HTN who presented to [facility] with a syncopal episode on 07/04/2014, NSTEMI by ez and tx [facility] 03/31. G+ cocci bacteremia & PNA so IM admit.  SUBJECTIVE: Slept well overnight, still feels groggy this a.m. Did get SOB this a.m. when nursing staff stood him up to weigh him. States he is having a nonproductive cough.  Denies SOB at rest, CP, palpitations, or dizziness.    OBJECTIVE Filed Vitals:   07/06/14 1527 07/06/14 2011 07/06/14 2125 07/07/14 0500  BP: 108/38 134/37 127/37 132/39  Pulse:   68 70  Temp:  98.2 F (36.8 C)  98.2 F (36.8 C)  TempSrc:  Oral  Oral  Resp:  18    Height:    6' (1.829 m)  Weight:    182 lb 3.2 oz (82.645 kg)  SpO2:  100%  95%    Intake/Output Summary (Last 24 hours) at 07/07/14 1020 Last data filed at 07/06/14 2200  Gross per 24 hour  Intake      3 ml  Output      0 ml  Net      3 ml   Filed Weights   07/06/14 1525 07/07/14 0500  Weight: 190 lb 8 oz (86.41 kg) 182 lb 3.2 oz (82.645 kg)    PHYSICAL EXAM General: Well developed, well nourished, male in no acute distress. Head: Normocephalic, atraumatic.  Neck: Supple without bruits, no JVD. Lungs:  Resp regular and unlabored, CTA. Mild rales at bases. Heart: RRR, S1, S2, no S3, S4,  3/6 diastolic murmur; no rub. Abdomen: Soft, non-tender, non-distended, BS + x 4.  Extremities: No clubbing, cyanosis, no edema.  Neuro: a little sleepy, but awake and oriented X 3. Moves all extremities spontaneously. Psych: Normal  affect.  LABS: CBC: Recent Labs  07/07/14 0519  WBC 9.1  HGB 8.6*  HCT 27.9*  MCV 78.8  PLT 295   INR: Recent Labs  07/07/14 0519  INR 3.41   Basic Metabolic Panel: Recent Labs  07/06/14 1940 07/07/14 0519  NA  --  139  K  --  3.2*  CL  --  110  CO2  --  25  GLUCOSE  --  81  BUN  --  20  CREATININE  --  1.31  CALCIUM  --  8.1*  MG 2.1  --    Liver Function Tests: Recent Labs  07/07/14 0519  AST 31  ALT 21  ALKPHOS 64  BILITOT 0.6  PROT 5.3*  ALBUMIN 1.9*   Cardiac Enzymes: Recent Labs  07/06/14 1940 07/07/14 0005 07/07/14 0519  TROPONINI 4.03* 3.46* 3.11*   BNP:  B NATRIURETIC PEPTIDE  Date/Time Value Ref Range Status  07/06/2014 07:40 PM 779.9* 0.0 - 100.0 pg/mL Final   Fasting Lipid Panel: Recent Labs  07/07/14 0519  CHOL 82  HDL 18*  LDLCALC 43  TRIG 103  CHOLHDL 4.6   Thyroid Function Tests: Recent Labs  07/06/14 1940  TSH 2.343   TELE:  SR, 72. Multiform PVCs overnight.  ECG: 07/07/2014  SR, inferior and lateral T wave inversion were commented on at admission H&P from [facility], no old ECGs available  Radiology/Studies: Dg Chest 2 View  07/06/2014   CLINICAL DATA:  Pneumonia.  Follow-up radiograph.  EXAM: CHEST  2 VIEW  COMPARISON:  07/05/2014.  FINDINGS: Cardiopericardial silhouette is enlarged. Improving airspace disease is present of both lung bases. This can be seen on the frontal projection in the retrocardiac region as well is in the RIGHT infrahilar region. Costophrenic angles are partially excluded on the frontal views. Small bilateral pleural effusions later posteriorly/dependently. Monitoring leads project over the chest.  IMPRESSION: 1. Unchanged mild cardiomegaly. 2. Improving roughly symmetric medial bilateral basilar airspace disease favored to represent either edema or aspiration pneumonitis over pneumonia.   Electronically Signed   By: Dereck Ligas M.D.   On: 07/06/2014 18:02     Current Medications:  . amiodarone   200 mg Oral Daily  . aspirin  324 mg Oral NOW   Or  . aspirin  300 mg Rectal NOW  . aspirin EC  81 mg Oral Daily  . atorvastatin  40 mg Oral QHS  . digoxin  125 mcg Oral Daily  . feeding supplement (ENSURE ENLIVE)  237 mL Oral BID BM  . levofloxacin  750 mg Oral Daily  . metoprolol tartrate  25 mg Oral BID  . sodium chloride  3 mL Intravenous Q12H  . vancomycin  750 mg Intravenous Q12H   . heparin 1,300 Units/hr (07/06/14 2153)    ASSESSMENT AND PLAN: Principal Problem:   NSTEMI (non-ST elevated myocardial infarction) -No CP overnight -Troponin levels: 4.03, 3.46, 3.11 -2D echo pending -EKG with T wave inversion in Leads II and lateral leads, documented in [facility] H&P - cath at [facility] within last 2 weeks, reviewed by Drs Angelena Form and Nahser, their evaluation: The LAD has moderate disease but is not seen very well ijn all views. The LCx has a tight stenosis but is not a good target for CABG - it primarily fills a small , subtotalled OM ( that already has collateral flow). The RCA has distal disease and could potentially be stented  - Continue aspirin, heparin, statin and BB  Active Problems:   Bacteremia - According to results from  regional gram-positive cocci bacteremia in 2 bottles - Currently on Vancomycin -Repeat blood cultures drawn today -per IM    Atrial fibrillation -This patients CHA2DS2-VASc Score and unadjusted Ischemic Stroke Rate (% per year) is equal to 3.2 % stroke rate/year from a score of 3 Above score calculated as 1 point each if present [CHF, HTN, DM, Vascular=MI/PAD/Aortic Plaque, Age if 65-74, or Male] Above score calculated as 2 points each if present [Age > 75, or Stroke/TIA/TE] -Currently in NSR -Continue heparin, amiodarone, digoxin    Chronic systolic CHF (congestive heart failure) -Last known EF 35% by LV gram on last heart cath - BNP: 779.9 - Euvolemic on exam -2D echo pending -Continue BB -Continue to hold ACE d/t hypotension and  recent AKI, BUN/Creat: 20/1.31 today.    Syncopal episode -Denies any further episodes - BP improved -Continue to monitor on telemetry    Hypertension -Currently on BB only -Continue to hold home dose of lisinopril and imdur    Right lower lobe pneumonia -Repeat CXR with improving bilateral medial basilar airspace -On IV vancomycin -Blood cultures pending -per IM    Hypokalemia -K: 3.2  -Give 60 po K-dur today, would like K+ closer to 4 with low EF and PVCs  -Recheck  BMET in a.m., Mg was 2.1  Signed, Rosaria Ferries , PA-C 10:20 AM 07/07/2014 Patient seen and examined and history reviewed. Agree with above findings and plan. Still feels wiped out. No chest pain or SOB. Ecg shows anterolateral ST-T inversion. Troponin levels decreasing. Echo pending. Plan to treat CAD especially in setting of bacteremia and PNA. From a cardiac standpoint could resume Eliquis since no intervention planned.  Kavian Peters [place], [place] 07/07/2014 12:55 PM

## 2014-07-07 NOTE — Progress Notes (Signed)
Per am EKG, QTC is long at 578.

## 2014-07-07 NOTE — Progress Notes (Addendum)
ANTICOAGULATION CONSULT NOTE - Follow Up Consult  Pharmacy Consult for heparin Indication: ACS/afib  Allergies  Allergen Reactions  . Penicillins     Unable to say what kind of reaction    Patient Measurements: Height: 6' (182.9 cm) Weight: 182 lb 3.2 oz (82.645 kg) IBW/kg (Calculated) : 77.6 Heparin Dosing Weight: 86kg  Vital Signs: Temp: 98.2 F (36.8 C) (04/01 0500) Temp Source: Oral (04/01 0500) BP: 132/39 mmHg (04/01 0500) Pulse Rate: 70 (04/01 0500)  Labs:  Recent Labs  07/06/14 1940 07/07/14 0005 07/07/14 0519  HGB  --   --  8.6*  HCT  --   --  27.9*  PLT  --   --  295  APTT 59*  --  102*  LABPROT 15.6*  --  15.5*  INR 1.22  --  1.21  TROPONINI 4.03* 3.46*  --     CrCl cannot be calculated (Patient has no serum creatinine result on file.).   Assessment: Patient started on heparin 3/30 per Rx at [facility], he was taking eliquis pta for afib (last dose 3/29 am per wife) according to pharmacy at [facility] his HL was elevated at 1.1 this am likely d/t eliquis.   Aptt check on arrival to [facility] was low at 59, no bleeding or IV issues noted. Will adjust rate and recheck coags in am.   Follow-up aPTT is therapeutic at upper limit of normal at 102 seconds. Hgb 8.6, Plt 295, Trop 3.46. Nurse noted no bleeding or issues with line. Will slightly decrease heparin rate to maintain therapeutic level.  Goal of Therapy:  aPTT 66-102 seconds Monitor platelets by anticoagulation protocol: Yes   Plan:  Decrease heparin to 1250 units/hr Daily aPTT/HL/CBC Monitor s/sx of bleeding  Thank you. Anette Guarneri, PharmD [phone]

## 2014-07-07 NOTE — Progress Notes (Addendum)
[facility] PROGRESS NOTE   Joe Kennedy KDX:[id number] DOB: 11/08/1940 DOA: 07/06/2014 PCP: No primary care provider on file.  HPI/Subjective: Patient is awake alert oriented, wife is sitting by the bedside According to her the patient is doing well Afebrile overnight  Assessment/Plan: Principal Problem:   NSTEMI (non-ST elevated myocardial infarction) Active Problems:   Bacteremia   Atrial fibrillation   Chronic systolic CHF (congestive heart failure)   Right lower lobe pneumonia   Non-STEMI Patient presented to [facility] with syncope. Troponin went up to 11, admitted under cardiology initially. -No CP overnight -Troponin levels: 4.03, 3.46, 3.11 -2D echo pending -EKG with T wave inversion in Leads II and lateral leads, documented in [facility] H&P - cath at [facility] within last 2 weeks, reviewed by Drs Angelena Form and Nahser, their evaluation: The LAD has moderate disease but is not seen very well ijn all views. The LCx has a tight stenosis but is not a good target for CABG - it primarily fills a small , subtotalled OM ( that already has collateral flow). The RCA has distal disease and could potentially be stented  - Continue aspirin, heparin, statin and beta blocker Awaiting further cardiology recommendations   Chronic systolic CHF (congestive heart failure) -Last known EF 35% by LV gram on last heart cath - BNP: 779.9 - Euvolemic on exam -2D echo pending -Continue BB -Continue to hold ACE d/t hypotension and recent AKI, BUN/Creat: 20/1.31 today.  GPC Bacteremia  According to results from [facility] gram-positive cocci bacteremia in 2 bottles. This is likely secondary to pneumonia, patient already on vancomycin/Levaquin. Repeat blood culture 3/31, 4/1 pending  Right lower lobe pneumonia X-rays from [facility] yesterday showed probable RLL. Continue antibiotics  Atrial fibrillation Patient was on Eliquis as outpatient, currently on  heparin drip for the non-STEMI. Rate is controlled, EKG pending. CHA2DS2-VASc score of 3  Hypokalemia Repleted Recheck labs in a.m.  Code Status: Full Code Family Communication: Plan discussed with the patient. Disposition Plan: Remains inpatient Diet: Diet Heart Room service appropriate?: Yes; Fluid consistency:: Thin  Consultants:  Cardiology  Procedures:  None  Antibiotics:  Vancomycin   Objective: Filed Vitals:   07/07/14 0500  BP: 132/39  Pulse: 70  Temp: 98.2 F (36.8 C)  Resp:     Intake/Output Summary (Last 24 hours) at 07/07/14 1159 Last data filed at 07/07/14 1043  Gross per 24 hour  Intake    123 ml  Output      0 ml  Net    123 ml   Filed Weights   07/06/14 1525 07/07/14 0500  Weight: 86.41 kg (190 lb 8 oz) 82.645 kg (182 lb 3.2 oz)    Exam: General: Well developed, well nourished, male in no acute distress. Head: Normocephalic, atraumatic. Neck: Supple without bruits, no JVD. Lungs: Resp regular and unlabored, CTA. Mild rales at bases. Heart: RRR, S1, S2, no S3, S4, 3/6 diastolic murmur; no rub. Abdomen: Soft, non-tender, non-distended, BS + x 4.  Extremities: No clubbing, cyanosis, no edema.  Neuro: a little sleepy, but awake and oriented X 3. Moves all extremities spontaneously. Psych: Normal affect.  Data Reviewed: Basic Metabolic Panel:  Recent Labs Lab 07/06/14 1940 07/07/14 0519  NA  --  139  K  --  3.2*  CL  --  110  CO2  --  25  GLUCOSE  --  81  BUN  --  20  CREATININE  --  1.31  CALCIUM  --  8.1*  MG 2.1  --    Liver Function Tests:  Recent Labs Lab 07/07/14 0519  AST 31  ALT 21  ALKPHOS 64  BILITOT 0.6  PROT 5.3*  ALBUMIN 1.9*   No results for input(s): LIPASE, AMYLASE in the last 168 hours. No results for input(s): AMMONIA in the last 168 hours. CBC:  Recent Labs Lab 07/07/14 0519  WBC 9.1  HGB 8.6*  HCT 27.9*  MCV 78.8  PLT 295   Cardiac Enzymes:  Recent Labs Lab 07/06/14 1940  07/07/14 0005 07/07/14 0519  TROPONINI 4.03* 3.46* 3.11*   BNP (last 3 results)  Recent Labs  07/06/14 1940  BNP 779.9*    ProBNP (last 3 results) No results for input(s): PROBNP in the last 8760 hours.  CBG: No results for input(s): GLUCAP in the last 168 hours.  Micro No results found for this or any previous visit (from the past 240 hour(s)).   Studies: Dg Chest 2 View  07/06/2014   CLINICAL DATA:  Pneumonia.  Follow-up radiograph.  EXAM: CHEST  2 VIEW  COMPARISON:  07/05/2014.  FINDINGS: Cardiopericardial silhouette is enlarged. Improving airspace disease is present of both lung bases. This can be seen on the frontal projection in the retrocardiac region as well is in the RIGHT infrahilar region. Costophrenic angles are partially excluded on the frontal views. Small bilateral pleural effusions later posteriorly/dependently. Monitoring leads project over the chest.  IMPRESSION: 1. Unchanged mild cardiomegaly. 2. Improving roughly symmetric medial bilateral basilar airspace disease favored to represent either edema or aspiration pneumonitis over pneumonia.   Electronically Signed   By: Dereck Ligas M.D.   On: 07/06/2014 18:02    Scheduled Meds: . amiodarone  200 mg Oral Daily  . aspirin  324 mg Oral NOW   Or  . aspirin  300 mg Rectal NOW  . aspirin EC  81 mg Oral Daily  . atorvastatin  40 mg Oral QHS  . digoxin  125 mcg Oral Daily  . feeding supplement (ENSURE ENLIVE)  237 mL Oral BID BM  . levofloxacin  750 mg Oral Daily  . metoprolol tartrate  25 mg Oral BID  . potassium chloride  20 mEq Oral TID  . sodium chloride  3 mL Intravenous Q12H  . vancomycin  750 mg Intravenous Q12H   Continuous Infusions: . heparin 1,250 Units/hr (07/07/14 1020)       Time spent: 35 minutes    [facility]  [facility] Pager [phone] If 7PM-7AM, please contact night-coverage at [email or web address], password [facility] 07/07/2014, 11:59 AM  LOS: 1 day

## 2014-07-07 NOTE — Progress Notes (Signed)
INITIAL NUTRITION ASSESSMENT  DOCUMENTATION CODES Per approved criteria  -Severe malnutrition in the context of acute illness or injury   Pt meets criteria for severe MALNUTRITION in the context of acute illness as evidenced by 13% wt loss x 3 months, moderate fat and muscle depletion.  INTERVENTION: -Continue Ensure Enlive po BID, each supplement provides 350 kcal and 20 grams of protein  NUTRITION DIAGNOSIS: Inadequate oral intake related to decreased appetite as evidenced by 13% wt loss x 3 months, moderate fat and muscle depletion.   Goal: Pt will meet >90% of estimated nutritional needs  Monitor:  PO/supplement intake, labs, weight changes, I/O's  Reason for Assessment: MST=2  74 y.o. male  Admitting Dx: NSTEMI (non-ST elevated myocardial infarction)  Joe Kennedy is a 74 y.o. male with a history of atrial fibrillation on Eliquis, CAD, HLD, chronic systolic heart failure (EF 35%), chronic anemia, and HTN who presented to [facility] with a syncopal episode on 07/04/2014. He eventually ruled in for NSTEMI and was transferred to [facility] today for evaluation by CTCS.  ASSESSMENT: Pt admitted from [facility] with dx of NSTEMI. Hx obtained by pt and wife at bedside. Both report a general decline in health over the past 3 months; pt wife endorses that pt has been much less active and weak since "his bone-on-bone arthritis started acting up". As a result, pt has been much less active and fairly dependent on his wife.  Both confirm a hx of wt loss and poor appetite during this time period. Wife reports UBW of 210#, which reflects a 13% wt loss over the past 3 months. Pt denies hx of diuretic use. Wife also reveals that pt often only eats 4-5 bites at meals due to lack of appetite. He has started drink Ensure shakes with ice cream daily due to weight loss and poor po intake. Pt has Ensure Enlive ordered BID; he has already drank 75% of supplement provided earlier this AM.  Pt reports he  consumed his breakfast well today- ate 100% of eggs and sausage and 25% of his oatmeal. He shared that he was very proud about consuming his "protein foods".  Educated pt and wife on the importance of good PO intake to support healing. Encouraged continued use of Ensure supplement. Labs reviewed. K: 3.2 (on supplement), Calcium: 8.1.  Nutrition Focused Physical Exam:  Subcutaneous Fat:  Orbital Region: moderate depletion Upper Arm Region: mild depletion Thoracic and Lumbar Region: WDL  Muscle:  Temple Region: moderate depletion Clavicle Bone Region: moderate depletion Clavicle and Acromion Bone Region: mild depletion Scapular Bone Region: mild depletion Dorsal Hand: WDL Patellar Region: mild depletion Anterior Thigh Region: mild depletion Posterior Calf Region: mild depletion  Edema: none present  Height: Ht Readings from Last 1 Encounters:  07/07/14 6' (1.829 m)    Weight: Wt Readings from Last 1 Encounters:  07/07/14 182 lb 3.2 oz (82.645 kg)    Ideal Body Weight: 178#  % Ideal Body Weight: 102%  Wt Readings from Last 10 Encounters:  07/07/14 182 lb 3.2 oz (82.645 kg)    Usual Body Weight: 210#  % Usual Body Weight: 87%  BMI:  Body mass index is 24.71 kg/(m^2). Normal weight range  Estimated Nutritional Needs: Kcal: 1850-2050 Protein: 90-100 grams Fluid: 1.9-2.1 L  Skin: Intact  Diet Order: Diet Heart Room service appropriate?: Yes; Fluid consistency:: Thin  EDUCATION NEEDS: -Education needs addressed   Intake/Output Summary (Last 24 hours) at 07/07/14 1202 Last data filed at 07/07/14 1043  Gross per 24 hour  Intake    123 ml  Output      0 ml  Net    123 ml    Last BM: 07/06/14  Labs:   Recent Labs Lab 07/06/14 1940 07/07/14 0519  NA  --  139  K  --  3.2*  CL  --  110  CO2  --  25  BUN  --  20  CREATININE  --  1.31  CALCIUM  --  8.1*  MG 2.1  --   GLUCOSE  --  81    CBG (last 3)  No results for input(s): GLUCAP in the last 72  hours.  Scheduled Meds: . amiodarone  200 mg Oral Daily  . aspirin  324 mg Oral NOW   Or  . aspirin  300 mg Rectal NOW  . aspirin EC  81 mg Oral Daily  . atorvastatin  40 mg Oral QHS  . digoxin  125 mcg Oral Daily  . feeding supplement (ENSURE ENLIVE)  237 mL Oral BID BM  . levofloxacin  750 mg Oral Daily  . metoprolol tartrate  25 mg Oral BID  . potassium chloride  20 mEq Oral TID  . sodium chloride  3 mL Intravenous Q12H  . vancomycin  750 mg Intravenous Q12H    Continuous Infusions: . heparin 1,250 Units/hr (07/07/14 1020)    Past Medical History  Diagnosis Date  . Hypertension   . Myocardial infarction   . CHF (congestive heart failure)   . Shortness of breath dyspnea   . Arthritis     KNEES    History reviewed. No pertinent past surgical history.  Zahrah Sutherlin A. Jimmye Norman, RD, LDN, CDE Pager: [phone] After hours Pager: [phone]

## 2014-07-07 NOTE — Progress Notes (Signed)
CARE MANAGEMENT NOTE 07/07/2014  Patient:  LORD, LANCOUR   Account Number:  [id number]  Date Initiated:  07/07/2014  Documentation initiated by:  Olga Coaster  Subjective/Objective Assessment:   ADMITTED WITH NSTEMI, BACTEREMIA     Action/Plan:   CM FOLLOWING FOR DCP   Anticipated DC Date:  07/12/2014   Anticipated DC Plan:  [place]  CM consult         Status of service:  In process, will continue to follow  Per UR Regulation:  Reviewed for med. necessity/level of care/duration of stay  Comments:  4/1/2016Mindi Slicker RN,BSN,MHA [phone]

## 2014-07-08 DIAGNOSIS — A491 Streptococcal infection, unspecified site: Secondary | ICD-10-CM | POA: Diagnosis present

## 2014-07-08 DIAGNOSIS — D649 Anemia, unspecified: Secondary | ICD-10-CM | POA: Diagnosis present

## 2014-07-08 DIAGNOSIS — I10 Essential (primary) hypertension: Secondary | ICD-10-CM | POA: Diagnosis present

## 2014-07-08 DIAGNOSIS — I509 Heart failure, unspecified: Secondary | ICD-10-CM

## 2014-07-08 DIAGNOSIS — R634 Abnormal weight loss: Secondary | ICD-10-CM | POA: Diagnosis present

## 2014-07-08 DIAGNOSIS — I2510 Atherosclerotic heart disease of native coronary artery without angina pectoris: Secondary | ICD-10-CM | POA: Diagnosis present

## 2014-07-08 DIAGNOSIS — B954 Other streptococcus as the cause of diseases classified elsewhere: Secondary | ICD-10-CM

## 2014-07-08 DIAGNOSIS — M25561 Pain in right knee: Secondary | ICD-10-CM | POA: Diagnosis present

## 2014-07-08 DIAGNOSIS — E43 Unspecified severe protein-calorie malnutrition: Secondary | ICD-10-CM | POA: Diagnosis present

## 2014-07-08 DIAGNOSIS — E785 Hyperlipidemia, unspecified: Secondary | ICD-10-CM | POA: Diagnosis present

## 2014-07-08 DIAGNOSIS — R7881 Bacteremia: Secondary | ICD-10-CM

## 2014-07-08 LAB — CBC
HCT: 27.7 % — ABNORMAL LOW (ref 39.0–52.0)
Hemoglobin: 8.6 g/dL — ABNORMAL LOW (ref 13.0–17.0)
MCH: 24.3 pg — ABNORMAL LOW (ref 26.0–34.0)
MCHC: 31 g/dL (ref 30.0–36.0)
MCV: 78.2 fL (ref 78.0–100.0)
Platelets: 280 10*3/uL (ref 150–400)
RBC: 3.54 MIL/uL — ABNORMAL LOW (ref 4.22–5.81)
RDW: 16.7 % — ABNORMAL HIGH (ref 11.5–15.5)
WBC: 7.9 10*3/uL (ref 4.0–10.5)

## 2014-07-08 LAB — COMPREHENSIVE METABOLIC PANEL
ALT: 25 U/L (ref 0–53)
ANION GAP: 5 (ref 5–15)
AST: 37 U/L (ref 0–37)
Albumin: 1.9 g/dL — ABNORMAL LOW (ref 3.5–5.2)
Alkaline Phosphatase: 66 U/L (ref 39–117)
BILIRUBIN TOTAL: 0.8 mg/dL (ref 0.3–1.2)
BUN: 22 mg/dL (ref 6–23)
CHLORIDE: 110 mmol/L (ref 96–112)
CO2: 23 mmol/L (ref 19–32)
CREATININE: 1.25 mg/dL (ref 0.50–1.35)
Calcium: 8 mg/dL — ABNORMAL LOW (ref 8.4–10.5)
GFR, EST AFRICAN AMERICAN: 64 mL/min — AB (ref >90)
GFR, EST NON AFRICAN AMERICAN: 55 mL/min — AB (ref >90)
Glucose, Bld: 88 mg/dL (ref 70–99)
Potassium: 3.4 mmol/L — ABNORMAL LOW (ref 3.5–5.1)
Sodium: 138 mmol/L (ref 135–145)
Total Protein: 5.4 g/dL — ABNORMAL LOW (ref 6.0–8.3)

## 2014-07-08 LAB — APTT: aPTT: 80 seconds — ABNORMAL HIGH (ref 24–37)

## 2014-07-08 LAB — HEMOGLOBIN A1C
HEMOGLOBIN A1C: 5.9 % — AB (ref 4.8–5.6)
MEAN PLASMA GLUCOSE: 123 mg/dL

## 2014-07-08 LAB — HEPARIN LEVEL (UNFRACTIONATED): Heparin Unfractionated: 0.25 IU/mL — ABNORMAL LOW (ref 0.30–0.70)

## 2014-07-08 MED ORDER — POTASSIUM CHLORIDE IN NACL 40-0.9 MEQ/L-% IV SOLN
INTRAVENOUS | Status: AC
  Administered 2014-07-08: 75 mL/h via INTRAVENOUS
  Filled 2014-07-08: qty 1000

## 2014-07-08 MED ORDER — LEVOFLOXACIN IN D5W 750 MG/150ML IV SOLN
750.0000 mg | INTRAVENOUS | Status: DC
  Administered 2014-07-08 – 2014-07-09 (×2): 750 mg via INTRAVENOUS
  Filled 2014-07-08 (×4): qty 150

## 2014-07-08 MED ORDER — POTASSIUM CHLORIDE CRYS ER 20 MEQ PO TBCR
20.0000 meq | EXTENDED_RELEASE_TABLET | Freq: Three times a day (TID) | ORAL | Status: AC
  Administered 2014-07-08 (×3): 20 meq via ORAL
  Filled 2014-07-08 (×3): qty 1

## 2014-07-08 MED ORDER — SACCHAROMYCES BOULARDII 250 MG PO CAPS
250.0000 mg | ORAL_CAPSULE | Freq: Two times a day (BID) | ORAL | Status: DC
  Administered 2014-07-08 – 2014-07-15 (×15): 250 mg via ORAL
  Filled 2014-07-08 (×15): qty 1

## 2014-07-08 NOTE — Progress Notes (Signed)
  Echocardiogram 2D Echocardiogram has been performed.  Joe Kennedy 07/08/2014, 1:43 PM

## 2014-07-08 NOTE — Progress Notes (Signed)
Patient ID: Joe Kennedy, male   DOB: 13-Jun-1940, 74 y.o.   MRN: [id number]         [place] for Infectious Disease    Date of Admission:  07/06/2014           Day 3 vancomycin        Day 3 levofloxacin       Reason for Consult: Viridans streptococcal bacteremia and possible pneumonia    Referring Physician: Dr. Reyne Dumas  Principal Problem:   Viridans streptococci infection Active Problems:   Bacteremia   Unintentional weight loss   Acute pain of right knee   Right lower lobe pneumonia   NSTEMI (non-ST elevated myocardial infarction)   Atrial fibrillation   Chronic systolic CHF (congestive heart failure)   Protein-calorie malnutrition, severe   Normocytic anemia   Hypertension   Coronary artery disease   Dyslipidemia   . amiodarone  200 mg Oral Daily  . aspirin EC  81 mg Oral Daily  . atorvastatin  40 mg Oral QHS  . digoxin  125 mcg Oral Daily  . feeding supplement (ENSURE ENLIVE)  237 mL Oral BID BM  . levofloxacin (LEVAQUIN) IV  750 mg Intravenous Q24H  . metoprolol tartrate  25 mg Oral BID  . potassium chloride  20 mEq Oral TID  . saccharomyces boulardii  250 mg Oral BID  . sodium chloride  3 mL Intravenous Q12H  . vancomycin  750 mg Intravenous Q12H    Recommendations: 1. Continue current antibiotics pending repeat blood cultures  2. Hold off on PICC placement until blood cultures are negative 3. Compare today's TTE findings with previous TTE 4. Recommend TEE 5. Recommend orthopedic evaluation of right knee for possible septic arthritis 6. Agree with C. difficile PCR if diarrhea continues  Assessment: I suspect that [name] may have subacute bacterial endocarditis due to variant streptococci complicated by a septic right knee. He has acute bibasilar infiltrates. Not sure if this is pneumonia or heart failure or both. Given his penicillin allergy I would continue vancomycin for now for his streptococcal bacteremia and levofloxacin for a few more  days for his pneumonia.    HPI: Joe Kennedy is a 74 y.o. male started to feel bad in January. He recalls being woken about 4 AM one morning with sudden onset of severe right knee pain. He saw his primary care physician and was told that he had bone-on-bone arthritis. He had a steroid injection but noted no improvement. His pain got so bad that he can only get around with crutches. Around that same time he developed anorexia and has lost 35 pounds. He also started having chills and sweats.  More recently he developed some chest and arm pain and was seen in the emergency department at [facility]. He was sent home and return to see his primary care physician later that day. He had a syncopal episode with hypotension in his doctor's office and was taken back to [facility] and admitted. Tube 2 blood cultures done on admission air are growing an antibiotic sensitive viridans strep. Chest x-ray revealed bibasilar infiltrates. He was started on levofloxacin and vancomycin before transfer here 2 days ago.   Review of Systems: Constitutional: positive for anorexia, chills, fatigue, fevers, sweats and weight loss Eyes: negative Ears, nose, mouth, throat, and face: negative Respiratory: positive for cough and shortness of breath, negative for hemoptysis, sputum and wheezing Cardiovascular: positive for chest pressure/discomfort and syncope, negative  for orthopnea and paroxysmal nocturnal dyspnea Gastrointestinal: positive for diarrhea, negative for abdominal pain, constipation, nausea and vomiting Genitourinary:negative Musculoskeletal:positive for acute right knee pain  Past Medical History  Diagnosis Date  . Hypertension   . Myocardial infarction   . CHF (congestive heart failure)   . Shortness of breath dyspnea   . Arthritis     KNEES    History  Substance Use Topics  . Smoking status: Never Smoker   . Smokeless tobacco: Never Used  . Alcohol  Use: 0.6 oz/week    1 Cans of beer per week    Family History  Problem Relation Age of Onset  . Coronary artery disease Brother    Allergies  Allergen Reactions  . Penicillins     Unable to say what kind of reaction    OBJECTIVE: Blood pressure 137/35, pulse 69, temperature 98.4 F (36.9 C), temperature source Oral, resp. rate 18, height 6' (1.829 m), weight 182 lb 3.2 oz (82.645 kg), SpO2 98 %. General: He is pleasant and in no distress. His wife is at the bedside Skin: No splinter or conjunctival hemorrhages Oral: Many missing teeth. No oropharyngeal lesions Lungs: Bibasilar rales posteriorly Cor: Regular S1 and S2 with a 2/6 diastolic murmur heard best at the left sternal border Abdomen: Soft and nontender with no palpable masses Joints and extremities: He has calluses over both knees. His right knee is swollen and slightly warm. It is painful with range of motion  Lab Results Lab Results  Component Value Date   WBC 7.9 07/08/2014   HGB 8.6* 07/08/2014   HCT 27.7* 07/08/2014   MCV 78.2 07/08/2014   PLT 280 07/08/2014    Lab Results  Component Value Date   CREATININE 1.25 07/08/2014   BUN 22 07/08/2014   NA 138 07/08/2014   K 3.4* 07/08/2014   CL 110 07/08/2014   CO2 23 07/08/2014    Lab Results  Component Value Date   ALT 25 07/08/2014   AST 37 07/08/2014   ALKPHOS 66 07/08/2014   BILITOT 0.8 07/08/2014     Microbiology: Recent Results (from the past 240 hour(s))  Culture, blood (routine x 2)     Status: None (Preliminary result)   Collection Time: 07/06/14  7:40 PM  Result Value Ref Range Status   Specimen Description BLOOD LEFT ANTECUBITAL  Final   Special Requests BOTTLES DRAWN AEROBIC AND ANAEROBIC 3CC EA  Final   Culture   Final           BLOOD CULTURE RECEIVED NO GROWTH TO DATE CULTURE WILL BE HELD FOR 5 DAYS BEFORE ISSUING A FINAL NEGATIVE REPORT Performed at [facility]    Report Status PENDING  Incomplete  Culture, blood (routine x  2)     Status: None (Preliminary result)   Collection Time: 07/06/14  7:50 PM  Result Value Ref Range Status   Specimen Description BLOOD LEFT HAND  Final   Special Requests BOTTLES DRAWN AEROBIC ONLY 2CC  Final   Culture   Final           BLOOD CULTURE RECEIVED NO GROWTH TO DATE CULTURE WILL BE HELD FOR 5 DAYS BEFORE ISSUING A FINAL NEGATIVE REPORT Performed at [facility]    Report Status PENDING  Incomplete  Culture, blood (routine x 2)     Status: None (Preliminary result)   Collection Time: 07/07/14 10:46 AM  Result Value Ref Range Status   Specimen Description BLOOD LEFT ARM  Final  Special Requests BOTTLES DRAWN AEROBIC AND ANAEROBIC 10CC EACH  Final   Culture   Final           BLOOD CULTURE RECEIVED NO GROWTH TO DATE CULTURE WILL BE HELD FOR 5 DAYS BEFORE ISSUING A FINAL NEGATIVE REPORT Performed at [facility]    Report Status PENDING  Incomplete  Culture, blood (routine x 2)     Status: None (Preliminary result)   Collection Time: 07/07/14 10:53 AM  Result Value Ref Range Status   Specimen Description BLOOD LEFT HAND  Final   Special Requests BOTTLES DRAWN AEROBIC AND ANAEROBIC 10CC EACH  Final   Culture   Final           BLOOD CULTURE RECEIVED NO GROWTH TO DATE CULTURE WILL BE HELD FOR 5 DAYS BEFORE ISSUING A FINAL NEGATIVE REPORT Performed at [facility]    Report Status PENDING  Incomplete   Transthoracic echocardiogram 07/08/2014  Study Conclusions  - Left ventricle: The cavity size was mildly dilated. There was mild concentric hypertrophy. Systolic function was normal. The estimated ejection fraction was in the range of 50% to 55%. Features are consistent with a pseudonormal left ventricular filling pattern, with concomitant abnormal relaxation and increased filling pressure (grade 2 diastolic dysfunction). - Regional wall motion abnormality: Mild hypokinesis of the mid-apical anterior, mid anterolateral, apical lateral,  and apical myocardium. - Aortic valve: Trileaflet; mildly thickened, mildly calcified leaflets. There was no stenosis. There was moderate to severe regurgitation. - Mitral valve: Mildly thickened leaflets . There was mild regurgitation. - Left atrium: The atrium was mildly dilated. Volume/bsa, ES, (1-plane Simpson&'s, A2C): 30.1 ml/m^2. - Right ventricle: Systolic function was mildly reduced. - Right atrium: The atrium was mildly dilated. - Tricuspid valve: There was moderate-severe regurgitation. - Pulmonary arteries: PA peak pressure: 69 mm Hg (S). Severely elevated pulmonary pressures. - Inferior vena cava: The vessel was dilated. The respirophasic diameter changes were blunted (< 50%), consistent with elevated central venous pressure.   Kate Sable, MD 2016-04-02T14:18:01   Michel Bickers, MD [facility] [phone] pager   [phone] cell 07/08/2014, 3:20 PM

## 2014-07-08 NOTE — Progress Notes (Signed)
SUBJECTIVE:  He is very fatigued.  No pain   PHYSICAL EXAM Filed Vitals:   07/07/14 0500 07/07/14 1413 07/07/14 2012 07/08/14 0500  BP: 132/39 122/33 146/43 137/35  Pulse: 70 70 72 69  Temp: 98.2 F (36.8 C) 98.1 F (36.7 C) 97.8 F (36.6 C) 98.4 F (36.9 C)  TempSrc: Oral Oral Oral Oral  Resp:  18 18 18   Height: 6' (1.829 m)     Weight: 182 lb 3.2 oz (82.645 kg)   182 lb 3.2 oz (82.645 kg)  SpO2: 95% 98% 99% 98%   General:  No distress Lungs:  Clear Heart:  RRR Abdomen:  Positive bowel sounds, no rebound no guarding Extremities:  No edema   LABS: Lab Results  Component Value Date   TROPONINI 3.11* 07/07/2014   Results for orders placed or performed during the hospital encounter of 07/06/14 (from the past 24 hour(s))  Heparin level (unfractionated)     Status: Abnormal   Collection Time: 07/08/14  3:33 AM  Result Value Ref Range   Heparin Unfractionated 0.25 (L) 0.30 - 0.70 IU/mL  Comprehensive metabolic panel     Status: Abnormal   Collection Time: 07/08/14  3:33 AM  Result Value Ref Range   Sodium 138 135 - 145 mmol/L   Potassium 3.4 (L) 3.5 - 5.1 mmol/L   Chloride 110 96 - 112 mmol/L   CO2 23 19 - 32 mmol/L   Glucose, Bld 88 70 - 99 mg/dL   BUN 22 6 - 23 mg/dL   Creatinine, Ser 1.25 0.50 - 1.35 mg/dL   Calcium 8.0 (L) 8.4 - 10.5 mg/dL   Total Protein 5.4 (L) 6.0 - 8.3 g/dL   Albumin 1.9 (L) 3.5 - 5.2 g/dL   AST 37 0 - 37 U/L   ALT 25 0 - 53 U/L   Alkaline Phosphatase 66 39 - 117 U/L   Total Bilirubin 0.8 0.3 - 1.2 mg/dL   GFR calc non Af Amer 55 (L) >90 mL/min   GFR calc Af Amer 64 (L) >90 mL/min   Anion gap 5 5 - 15  CBC     Status: Abnormal   Collection Time: 07/08/14  3:33 AM  Result Value Ref Range   WBC 7.9 4.0 - 10.5 K/uL   RBC 3.54 (L) 4.22 - 5.81 MIL/uL   Hemoglobin 8.6 (L) 13.0 - 17.0 g/dL   HCT 27.7 (L) 39.0 - 52.0 %   MCV 78.2 78.0 - 100.0 fL   MCH 24.3 (L) 26.0 - 34.0 pg   MCHC 31.0 30.0 - 36.0 g/dL   RDW 16.7 (H) 11.5 - 15.5 %   Platelets 280 150 - 400 K/uL  APTT     Status: Abnormal   Collection Time: 07/08/14  3:33 AM  Result Value Ref Range   aPTT 80 (H) 24 - 37 seconds    Intake/Output Summary (Last 24 hours) at 07/08/14 1154 Last data filed at 07/08/14 1000  Gross per 24 hour  Intake 1967.82 ml  Output    450 ml  Net 1517.82 ml     ASSESSMENT AND PLAN:  ELEVATED CARDIAC ENZYMES:   NSTEMI.  Medical management.  We are not planning cath or intervention.   ATRIAL FIB:   We suggest resuming Eliquis when no further intervention is planned.   He is anemic.  This is probably secondary to chronic disease.  However, I don't see old labs and I don't see a stool guaiac.  I check the  guaiac.   CARDIOMYOPATHY:   Continue current meds.  Seems to be euvolemic.  SYNCOPE:  No further work up.  Possibly related to other comorbid conditions and acute illness.     Joe Kennedy [facility] 07/08/2014 11:54 AM

## 2014-07-08 NOTE — Progress Notes (Addendum)
[facility] PROGRESS NOTE   Joe Kennedy OAC:[id number] DOB: 08-28-1940 DOA: 07/06/2014 PCP: No primary care provider on file.  HPI/Subjective: Crampy abdominal pain, loose stool  Assessment/Plan: Principal Problem:   NSTEMI (non-ST elevated myocardial infarction) Active Problems:   Bacteremia   Atrial fibrillation   Chronic systolic CHF (congestive heart failure)   Right lower lobe pneumonia   Protein-calorie malnutrition, severe   Non-STEMI in the setting of sepsis Patient [facility] with syncope. Troponin went up to 11, admitted under cardiology initially. -No CP overnight -Troponin levels: 4.03, 3.46, 3.11 -2D echo pending -EKG with T wave inversion in Leads II and lateral leads, documented in [facility] H&P - cath at [facility] within last 2 weeks, reviewed by Drs Angelena Form and Nahser, their evaluation: The LAD has moderate disease but is not seen very well ijn all views. The LCx has a tight stenosis but is not a good target for CABG - it primarily fills a small , subtotalled OM ( that already has collateral flow). The RCA has distal disease and could potentially be stented  - Continue aspirin, heparin drip, statin and beta blocker Awaiting further cardiology recommendations, patient may need a TEE   Chronic systolic CHF (congestive heart failure) -Last known EF 35% by LV gram on last heart cath - BNP: 779.9 - Euvolemic on exam -2D echo pending -Continue BB -Continue to hold ACE d/t hypotension and recent AKI, BUN/Creat: 20/1.31 today.  Streptococcus viridans Bacteremia  According to results from [place] [facility] gram-positive cocci bacteremia in 2 bottles. This is likely secondary to pneumonia, patient already on vancomycin/Levaquin. Repeat blood culture 3/31, 4/1 no growth so far Infectious disease Dr. Megan Salon consulted and will see the patient Updated culture results from [place] in the chart  Diarrhea Patient complaining of  abdominal cramping and one loose stool this morning Switched levofloxacin to IV as patient unable to tolerate by mouth C. difficile PCR ordered Started the patient on florastor   Right lower lobe pneumonia X-rays from [place] [facility] yesterday showed probable RLL. Continue antibiotics  Atrial fibrillation Patient was on Eliquis as outpatient, currently on heparin drip for the non-STEMI. Rate is controlled, EKG pending. CHA2DS2-VASc score of 3  Hypokalemia Continue repletion Recheck labs in a.m.  Code Status: Full Code Family Communication: Plan discussed with the patient. Disposition Plan: Remains inpatient Diet: Diet Heart Room service appropriate?: Yes; Fluid consistency:: Thin  Consultants:  Cardiology  Procedures:  None  Antibiotics:  Vancomycin   Objective: Filed Vitals:   07/08/14 0500  BP: 137/35  Pulse: 69  Temp: 98.4 F (36.9 C)  Resp: 18    Intake/Output Summary (Last 24 hours) at 07/08/14 1144 Last data filed at 07/08/14 1000  Gross per 24 hour  Intake 1967.82 ml  Output    450 ml  Net 1517.82 ml   Filed Weights   07/06/14 1525 07/07/14 0500 07/08/14 0500  Weight: 86.41 kg (190 lb 8 oz) 82.645 kg (182 lb 3.2 oz) 82.645 kg (182 lb 3.2 oz)    Exam: General: Well developed, well nourished, male in no acute distress. Head: Normocephalic, atraumatic. Neck: Supple without bruits, no JVD. Lungs: Resp regular and unlabored, CTA. Mild rales at bases. Heart: RRR, S1, S2, no S3, S4, 3/6 diastolic murmur; no rub. Abdomen: Soft, non-tender, non-distended, BS + x 4.  Extremities: No clubbing, cyanosis, no edema.  Neuro: a little sleepy, but awake and oriented X 3. Moves all extremities spontaneously. Psych: Normal affect.  Data Reviewed: Basic Metabolic  Panel:  Recent Labs Lab 07/06/14 1940 07/07/14 0519 07/08/14 0333  NA  --  139 138  K  --  3.2* 3.4*  CL  --  110 110  CO2  --  25 23  GLUCOSE  --  81 88  BUN  --  20 22    CREATININE  --  1.31 1.25  CALCIUM  --  8.1* 8.0*  MG 2.1  --   --    Liver Function Tests:  Recent Labs Lab 07/07/14 0519 07/08/14 0333  AST 31 37  ALT 21 25  ALKPHOS 64 66  BILITOT 0.6 0.8  PROT 5.3* 5.4*  ALBUMIN 1.9* 1.9*   No results for input(s): LIPASE, AMYLASE in the last 168 hours. No results for input(s): AMMONIA in the last 168 hours. CBC:  Recent Labs Lab 07/07/14 0519 07/08/14 0333  WBC 9.1 7.9  HGB 8.6* 8.6*  HCT 27.9* 27.7*  MCV 78.8 78.2  PLT 295 280   Cardiac Enzymes:  Recent Labs Lab 07/06/14 1940 07/07/14 0005 07/07/14 0519  TROPONINI 4.03* 3.46* 3.11*   BNP (last 3 results)  Recent Labs  07/06/14 1940  BNP 779.9*    ProBNP (last 3 results) No results for input(s): PROBNP in the last 8760 hours.  CBG: No results for input(s): GLUCAP in the last 168 hours.  Micro Recent Results (from the past 240 hour(s))  Culture, blood (routine x 2)     Status: None (Preliminary result)   Collection Time: 07/06/14  7:40 PM  Result Value Ref Range Status   Specimen Description BLOOD LEFT ANTECUBITAL  Final   Special Requests BOTTLES DRAWN AEROBIC AND ANAEROBIC 3CC EA  Final   Culture   Final           BLOOD CULTURE RECEIVED NO GROWTH TO DATE CULTURE WILL BE HELD FOR 5 DAYS BEFORE ISSUING A FINAL NEGATIVE REPORT Performed at [facility]    Report Status PENDING  Incomplete  Culture, blood (routine x 2)     Status: None (Preliminary result)   Collection Time: 07/06/14  7:50 PM  Result Value Ref Range Status   Specimen Description BLOOD LEFT HAND  Final   Special Requests BOTTLES DRAWN AEROBIC ONLY 2CC  Final   Culture   Final           BLOOD CULTURE RECEIVED NO GROWTH TO DATE CULTURE WILL BE HELD FOR 5 DAYS BEFORE ISSUING A FINAL NEGATIVE REPORT Performed at [facility]    Report Status PENDING  Incomplete  Culture, blood (routine x 2)     Status: None (Preliminary result)   Collection Time: 07/07/14 10:46 AM  Result  Value Ref Range Status   Specimen Description BLOOD LEFT ARM  Final   Special Requests BOTTLES DRAWN AEROBIC AND ANAEROBIC 10CC EACH  Final   Culture   Final           BLOOD CULTURE RECEIVED NO GROWTH TO DATE CULTURE WILL BE HELD FOR 5 DAYS BEFORE ISSUING A FINAL NEGATIVE REPORT Performed at [facility]    Report Status PENDING  Incomplete  Culture, blood (routine x 2)     Status: None (Preliminary result)   Collection Time: 07/07/14 10:53 AM  Result Value Ref Range Status   Specimen Description BLOOD LEFT HAND  Final   Special Requests BOTTLES DRAWN AEROBIC AND ANAEROBIC 10CC EACH  Final   Culture   Final           BLOOD  CULTURE RECEIVED NO GROWTH TO DATE CULTURE WILL BE HELD FOR 5 DAYS BEFORE ISSUING A FINAL NEGATIVE REPORT Performed at [facility]    Report Status PENDING  Incomplete     Studies: Dg Chest 2 View  07/06/2014   CLINICAL DATA:  Pneumonia.  Follow-up radiograph.  EXAM: CHEST  2 VIEW  COMPARISON:  07/05/2014.  FINDINGS: Cardiopericardial silhouette is enlarged. Improving airspace disease is present of both lung bases. This can be seen on the frontal projection in the retrocardiac region as well is in the RIGHT infrahilar region. Costophrenic angles are partially excluded on the frontal views. Small bilateral pleural effusions later posteriorly/dependently. Monitoring leads project over the chest.  IMPRESSION: 1. Unchanged mild cardiomegaly. 2. Improving roughly symmetric medial bilateral basilar airspace disease favored to represent either edema or aspiration pneumonitis over pneumonia.   Electronically Signed   By: Dereck Ligas M.D.   On: 07/06/2014 18:02    Scheduled Meds: . amiodarone  200 mg Oral Daily  . aspirin EC  81 mg Oral Daily  . atorvastatin  40 mg Oral QHS  . digoxin  125 mcg Oral Daily  . feeding supplement (ENSURE ENLIVE)  237 mL Oral BID BM  . levofloxacin (LEVAQUIN) IV  750 mg Intravenous Q24H  . metoprolol tartrate  25 mg Oral BID    . saccharomyces boulardii  250 mg Oral BID  . sodium chloride  3 mL Intravenous Q12H  . vancomycin  750 mg Intravenous Q12H   Continuous Infusions: . heparin 1,250 Units/hr (07/08/14 0747)       Time spent: 35 minutes    [facility]  [facility] Pager [phone] If 7PM-7AM, please contact night-coverage at [email or web address], password [facility] 07/08/2014, 11:44 AM  LOS: 2 days

## 2014-07-08 NOTE — Progress Notes (Signed)
ANTICOAGULATION CONSULT NOTE - Follow Up Consult  Pharmacy Consult for Heparin Indication: ACS/STEMI  Allergies  Allergen Reactions  . Penicillins     Unable to say what kind of reaction    Patient Measurements: Height: 6' (182.9 cm) Weight: 182 lb 3.2 oz (82.645 kg) IBW/kg (Calculated) : 77.6 Heparin Dosing Weight: 82.6 kg  Vital Signs: Temp: 98.4 F (36.9 C) (04/02 0500) Temp Source: Oral (04/02 0500) BP: 137/35 mmHg (04/02 0500) Pulse Rate: 69 (04/02 0500)  Labs:  Recent Labs  07/06/14 1940 07/07/14 0005 07/07/14 0519 07/08/14 0333  HGB  --   --  8.6* 8.6*  HCT  --   --  27.9* 27.7*  PLT  --   --  295 280  APTT 59*  --  102* 80*  LABPROT 15.6*  --  15.5*  --   INR 1.22  --  1.21  --   HEPARINUNFRC  --   --  0.58 0.25*  CREATININE  --   --  1.31 1.25  TROPONINI 4.03* 3.46* 3.11*  --     Estimated Creatinine Clearance: 56.9 mL/min (by C-G formula based on Cr of 1.25).  Assessment:   Heparin level is just subtherapeutic today (0.25) on 1250 units/hr.  APTT is therapeutic (80 seconds).   Was on Eliquis prior to admission; last dose reported 3/29 am.  Recent Eliquis would usually cause increased heparin level, but level now low and aPTT is okay.  Hgb low stable. To check for FOB.  Goal of Therapy:  Heparin level 0.3-0.7 units/ml aPTT 66-102 seconds Monitor platelets by anticoagulation protocol: Yes   Plan:   Increase heparin drip to 1300 units/hr.  Heparin level, aPTT and CBC in am.  Will follow up plans.  Follow up for stool guaiacs.  Arty Baumgartner, [place] Pager: [phone] 07/08/2014,1:21 PM

## 2014-07-09 DIAGNOSIS — I480 Paroxysmal atrial fibrillation: Secondary | ICD-10-CM

## 2014-07-09 LAB — COMPREHENSIVE METABOLIC PANEL
ALT: 26 U/L (ref 0–53)
AST: 35 U/L (ref 0–37)
Albumin: 2.1 g/dL — ABNORMAL LOW (ref 3.5–5.2)
Alkaline Phosphatase: 72 U/L (ref 39–117)
Anion gap: 7 (ref 5–15)
BUN: 17 mg/dL (ref 6–23)
CHLORIDE: 112 mmol/L (ref 96–112)
CO2: 20 mmol/L (ref 19–32)
CREATININE: 1.24 mg/dL (ref 0.50–1.35)
Calcium: 8.3 mg/dL — ABNORMAL LOW (ref 8.4–10.5)
GFR calc non Af Amer: 56 mL/min — ABNORMAL LOW (ref >90)
GFR, EST AFRICAN AMERICAN: 64 mL/min — AB (ref >90)
Glucose, Bld: 88 mg/dL (ref 70–99)
Potassium: 4.3 mmol/L (ref 3.5–5.1)
SODIUM: 139 mmol/L (ref 135–145)
TOTAL PROTEIN: 5.9 g/dL — AB (ref 6.0–8.3)
Total Bilirubin: 0.5 mg/dL (ref 0.3–1.2)

## 2014-07-09 LAB — CBC
HCT: 29 % — ABNORMAL LOW (ref 39.0–52.0)
Hemoglobin: 9 g/dL — ABNORMAL LOW (ref 13.0–17.0)
MCH: 24.3 pg — ABNORMAL LOW (ref 26.0–34.0)
MCHC: 31 g/dL (ref 30.0–36.0)
MCV: 78.4 fL (ref 78.0–100.0)
Platelets: 306 10*3/uL (ref 150–400)
RBC: 3.7 MIL/uL — AB (ref 4.22–5.81)
RDW: 16.9 % — ABNORMAL HIGH (ref 11.5–15.5)
WBC: 7.9 10*3/uL (ref 4.0–10.5)

## 2014-07-09 LAB — OCCULT BLOOD X 1 CARD TO LAB, STOOL: Fecal Occult Bld: POSITIVE — AB

## 2014-07-09 LAB — CULTURE, BLOOD (SINGLE)

## 2014-07-09 LAB — APTT: aPTT: 63 seconds — ABNORMAL HIGH (ref 24–37)

## 2014-07-09 LAB — CLOSTRIDIUM DIFFICILE BY PCR: CDIFFPCR: NEGATIVE

## 2014-07-09 LAB — HEPARIN LEVEL (UNFRACTIONATED): Heparin Unfractionated: 0.25 IU/mL — ABNORMAL LOW (ref 0.30–0.70)

## 2014-07-09 NOTE — Progress Notes (Signed)
Patient ID: Joe Kennedy, male   DOB: July 25, 1940, 74 y.o.   MRN: [id number]         [place] for Infectious Disease    Date of Admission:  07/06/2014           Day 4 vancomycin        Day 4 levofloxacin  Principal Problem:   Viridans streptococci infection Active Problems:   Bacteremia   Unintentional weight loss   Acute pain of right knee   Right lower lobe pneumonia   NSTEMI (non-ST elevated myocardial infarction)   Atrial fibrillation   Chronic systolic CHF (congestive heart failure)   Protein-calorie malnutrition, severe   Normocytic anemia   Hypertension   Coronary artery disease   Dyslipidemia   . amiodarone  200 mg Oral Daily  . aspirin EC  81 mg Oral Daily  . atorvastatin  40 mg Oral QHS  . digoxin  125 mcg Oral Daily  . feeding supplement (ENSURE ENLIVE)  237 mL Oral BID BM  . levofloxacin (LEVAQUIN) IV  750 mg Intravenous Q24H  . metoprolol tartrate  25 mg Oral BID  . saccharomyces boulardii  250 mg Oral BID  . sodium chloride  3 mL Intravenous Q12H  . vancomycin  750 mg Intravenous Q12H    Subjective: He continues to be extremely fatigued. He had some productive cough this morning. He has had several bouts of watery diarrhea in the past 24 hours.  Review of Systems: Pertinent items are noted in HPI.  Past Medical History  Diagnosis Date  . Hypertension   . Myocardial infarction   . CHF (congestive heart failure)   . Shortness of breath dyspnea   . Arthritis     KNEES    History  Substance Use Topics  . Smoking status: Never Smoker   . Smokeless tobacco: Never Used  . Alcohol Use: 0.6 oz/week    1 Cans of beer per week    Family History  Problem Relation Age of Onset  . Coronary artery disease Brother    Allergies  Allergen Reactions  . Penicillins     Unable to say what kind of reaction    OBJECTIVE: Blood pressure 160/45, pulse 93, temperature 98.4 F (36.9 C), temperature source Oral, resp. rate 20, height 6' (1.829 m),  weight 191 lb (86.637 kg), SpO2 95 %. General: Alert and in no distress Skin: No rash Lungs: Bibasilar rales posteriorly Cor: Regular S1 and S2 with no change in 2/6 diastolic murmur Abdomen: Soft and nontender Right knee: Remains swollen and slightly warm  Lab Results Lab Results  Component Value Date   WBC 7.9 07/09/2014   HGB 9.0* 07/09/2014   HCT 29.0* 07/09/2014   MCV 78.4 07/09/2014   PLT 306 07/09/2014    Lab Results  Component Value Date   CREATININE 1.24 07/09/2014   BUN 17 07/09/2014   NA 139 07/09/2014   K 4.3 07/09/2014   CL 112 07/09/2014   CO2 20 07/09/2014    Lab Results  Component Value Date   ALT 26 07/09/2014   AST 35 07/09/2014   ALKPHOS 72 07/09/2014   BILITOT 0.5 07/09/2014     Microbiology: Recent Results (from the past 240 hour(s))  Culture, blood (routine x 2)     Status: None (Preliminary result)   Collection Time: 07/06/14  7:40 PM  Result Value Ref Range Status   Specimen Description BLOOD LEFT ANTECUBITAL  Final   Special Requests BOTTLES DRAWN AEROBIC  AND ANAEROBIC 3CC EA  Final   Culture   Final           BLOOD CULTURE RECEIVED NO GROWTH TO DATE CULTURE WILL BE HELD FOR 5 DAYS BEFORE ISSUING A FINAL NEGATIVE REPORT Performed at [facility]    Report Status PENDING  Incomplete  Culture, blood (routine x 2)     Status: None (Preliminary result)   Collection Time: 07/06/14  7:50 PM  Result Value Ref Range Status   Specimen Description BLOOD LEFT HAND  Final   Special Requests BOTTLES DRAWN AEROBIC ONLY 2CC  Final   Culture   Final           BLOOD CULTURE RECEIVED NO GROWTH TO DATE CULTURE WILL BE HELD FOR 5 DAYS BEFORE ISSUING A FINAL NEGATIVE REPORT Performed at [facility]    Report Status PENDING  Incomplete  Culture, blood (routine x 2)     Status: None (Preliminary result)   Collection Time: 07/07/14 10:46 AM  Result Value Ref Range Status   Specimen Description BLOOD LEFT ARM  Final   Special Requests  BOTTLES DRAWN AEROBIC AND ANAEROBIC 10CC EACH  Final   Culture   Final           BLOOD CULTURE RECEIVED NO GROWTH TO DATE CULTURE WILL BE HELD FOR 5 DAYS BEFORE ISSUING A FINAL NEGATIVE REPORT Performed at [facility]    Report Status PENDING  Incomplete  Culture, blood (routine x 2)     Status: None (Preliminary result)   Collection Time: 07/07/14 10:53 AM  Result Value Ref Range Status   Specimen Description BLOOD LEFT HAND  Final   Special Requests BOTTLES DRAWN AEROBIC AND ANAEROBIC 10CC EACH  Final   Culture   Final           BLOOD CULTURE RECEIVED NO GROWTH TO DATE CULTURE WILL BE HELD FOR 5 DAYS BEFORE ISSUING A FINAL NEGATIVE REPORT Performed at [facility]    Report Status PENDING  Incomplete  Clostridium Difficile by PCR     Status: None   Collection Time: 07/09/14 12:42 AM  Result Value Ref Range Status   C difficile by pcr NEGATIVE NEGATIVE Final    Assessment: He has viridans streptococcal bacteremia, pneumonia and possible septic arthritis of his right knee. His history suggests that he may have subacute bacterial endocarditis.  Plan: 1. Continue vancomycin for now for his bacteremia 2. 1 more day of levofloxacin for pneumonia 3. TEE 4. Stool for C. difficile PCR 5. Recommend orthopedic evaluation of right knee 6. Await results of repeat blood cultures  Joe Bickers, MD [facility] [place] [phone] pager   [phone] cell 07/09/2014, 12:56 PM

## 2014-07-09 NOTE — Progress Notes (Addendum)
[facility] PROGRESS NOTE   Joe Kennedy:[id number] DOB: 1940-07-22 DOA: 07/06/2014 PCP: No primary care provider on file.  HPI/Subjective: Crampy abdominal pain, loose stool  Assessment/Plan: Principal Problem:   Viridans streptococci infection Active Problems:   NSTEMI (non-ST elevated myocardial infarction)   Bacteremia   Atrial fibrillation   Chronic systolic CHF (congestive heart failure)   Right lower lobe pneumonia   Protein-calorie malnutrition, severe   Unintentional weight loss   Acute pain of right knee   Normocytic anemia   Hypertension   Coronary artery disease   Dyslipidemia   Non-STEMI in the setting of sepsis Patient presented to [facility] with syncope. Troponin went up to 11, admitted under cardiology initially. -No CP overnight -Troponin levels: 4.03, 3.46, 3.11 -2D echo pending -EKG with T wave inversion in Leads II and lateral leads, documented in [facility] H&P - cath at [facility] within last 2 weeks, reviewed by Drs Angelena Form and Nahser, their evaluation: The LAD has moderate disease but is not seen very well ijn all views. The LCx has a tight stenosis but is not a good target for CABG - it primarily fills a small , subtotalled OM ( that already has collateral flow). The RCA has distal disease and could potentially be stented  - Continue aspirin, heparin drip, statin and beta blocker There are no plans for cardiac catheterization    Chronic systolic CHF (congestive heart failure) -Last known EF 35% by LV gram on last heart cath,Moderate to severe AI on echo yesterday with moderate to severe AI. Moderate to severe TR Continue current medications, appears to be euvolemic -Continue BB -Continue to hold ACE d/t hypotension and recent AKI, renal function stable   Streptococcus viridans Bacteremia  According to results from [facility] gram-positive cocci bacteremia in 2 bottles. This is likely secondary to pneumonia,  patient already on vancomycin/Levaquin. Repeat blood culture 3/31, 4/1 no growth so far Infectious disease Dr. Megan Salon consulted , recommending TEE 4/4 Arthrocentesis right knee tomorrow   Diarrhea Improving, Switched levofloxacin to IV as patient unable to tolerate by mouth C. difficile PCR ordered Continue florastor   Right lower lobe pneumonia X-rays from [facility] yesterday showed probable RLL. Continue antibiotics  Atrial fibrillation Patient was on Eliquis as outpatient, currently on heparin drip for the non-STEMI. Rate is controlled,  Probably DC heparin and switch to Eliquis today, CHA2DS2-VASc score of 3  Hypokalemia Repleted   Code Status: Full Code Family Communication: Plan discussed with the patient. Disposition Plan: PT/OT eval  Diet: Diet Heart Room service appropriate?: Yes; Fluid consistency:: Thin Diet NPO time specified Except for: Sips with Meds  Consultants:  Cardiology  Procedures:  None  Antibiotics:  Vancomycin   Objective: Filed Vitals:   07/09/14 0846  BP: 160/45  Pulse: 93  Temp: 98.4 F (36.9 C)  Resp:     Intake/Output Summary (Last 24 hours) at 07/09/14 1215 Last data filed at 07/09/14 0600  Gross per 24 hour  Intake 1299.51 ml  Output    275 ml  Net 1024.51 ml   Filed Weights   07/07/14 0500 07/08/14 0500 07/09/14 0400  Weight: 82.645 kg (182 lb 3.2 oz) 82.645 kg (182 lb 3.2 oz) 86.637 kg (191 lb)    Exam: General: Well developed, well nourished, male in no acute distress. Head: Normocephalic, atraumatic. Neck: Supple without bruits, no JVD. Lungs: Resp regular and unlabored, CTA. Mild rales at bases. Heart: RRR, S1, S2, no S3, S4, 3/6 diastolic murmur; no rub.  Abdomen: Soft, non-tender, non-distended, BS + x 4.  Extremities: No clubbing, cyanosis, no edema.  Neuro: a little sleepy, but awake and oriented X 3. Moves all extremities spontaneously. Psych: Normal affect.  Data Reviewed: Basic  Metabolic Panel:  Recent Labs Lab 07/06/14 1940 07/07/14 0519 07/08/14 0333 07/09/14 0458  NA  --  139 138 139  K  --  3.2* 3.4* 4.3  CL  --  110 110 112  CO2  --  25 23 20   GLUCOSE  --  81 88 88  BUN  --  20 22 17   CREATININE  --  1.31 1.25 1.24  CALCIUM  --  8.1* 8.0* 8.3*  MG 2.1  --   --   --    Liver Function Tests:  Recent Labs Lab 07/07/14 0519 07/08/14 0333 07/09/14 0458  AST 31 37 35  ALT 21 25 26   ALKPHOS 64 66 72  BILITOT 0.6 0.8 0.5  PROT 5.3* 5.4* 5.9*  ALBUMIN 1.9* 1.9* 2.1*   No results for input(s): LIPASE, AMYLASE in the last 168 hours. No results for input(s): AMMONIA in the last 168 hours. CBC:  Recent Labs Lab 07/07/14 0519 07/08/14 0333 07/09/14 0458  WBC 9.1 7.9 7.9  HGB 8.6* 8.6* 9.0*  HCT 27.9* 27.7* 29.0*  MCV 78.8 78.2 78.4  PLT 295 280 306   Cardiac Enzymes:  Recent Labs Lab 07/06/14 1940 07/07/14 0005 07/07/14 0519  TROPONINI 4.03* 3.46* 3.11*   BNP (last 3 results)  Recent Labs  07/06/14 1940  BNP 779.9*    ProBNP (last 3 results) No results for input(s): PROBNP in the last 8760 hours.  CBG: No results for input(s): GLUCAP in the last 168 hours.  Micro Recent Results (from the past 240 hour(s))  Culture, blood (routine x 2)     Status: None (Preliminary result)   Collection Time: 07/06/14  7:40 PM  Result Value Ref Range Status   Specimen Description BLOOD LEFT ANTECUBITAL  Final   Special Requests BOTTLES DRAWN AEROBIC AND ANAEROBIC 3CC EA  Final   Culture   Final           BLOOD CULTURE RECEIVED NO GROWTH TO DATE CULTURE WILL BE HELD FOR 5 DAYS BEFORE ISSUING A FINAL NEGATIVE REPORT Performed at [facility]    Report Status PENDING  Incomplete  Culture, blood (routine x 2)     Status: None (Preliminary result)   Collection Time: 07/06/14  7:50 PM  Result Value Ref Range Status   Specimen Description BLOOD LEFT HAND  Final   Special Requests BOTTLES DRAWN AEROBIC ONLY 2CC  Final   Culture    Final           BLOOD CULTURE RECEIVED NO GROWTH TO DATE CULTURE WILL BE HELD FOR 5 DAYS BEFORE ISSUING A FINAL NEGATIVE REPORT Performed at [facility]    Report Status PENDING  Incomplete  Culture, blood (routine x 2)     Status: None (Preliminary result)   Collection Time: 07/07/14 10:46 AM  Result Value Ref Range Status   Specimen Description BLOOD LEFT ARM  Final   Special Requests BOTTLES DRAWN AEROBIC AND ANAEROBIC 10CC EACH  Final   Culture   Final           BLOOD CULTURE RECEIVED NO GROWTH TO DATE CULTURE WILL BE HELD FOR 5 DAYS BEFORE ISSUING A FINAL NEGATIVE REPORT Performed at [facility]    Report Status PENDING  Incomplete  Culture, blood (routine x 2)     Status: None (Preliminary result)   Collection Time: 07/07/14 10:53 AM  Result Value Ref Range Status   Specimen Description BLOOD LEFT HAND  Final   Special Requests BOTTLES DRAWN AEROBIC AND ANAEROBIC 10CC EACH  Final   Culture   Final           BLOOD CULTURE RECEIVED NO GROWTH TO DATE CULTURE WILL BE HELD FOR 5 DAYS BEFORE ISSUING A FINAL NEGATIVE REPORT Performed at [facility]    Report Status PENDING  Incomplete  Clostridium Difficile by PCR     Status: None   Collection Time: 07/09/14 12:42 AM  Result Value Ref Range Status   C difficile by pcr NEGATIVE NEGATIVE Final     Studies: No results found.  Scheduled Meds: . amiodarone  200 mg Oral Daily  . aspirin EC  81 mg Oral Daily  . atorvastatin  40 mg Oral QHS  . digoxin  125 mcg Oral Daily  . feeding supplement (ENSURE ENLIVE)  237 mL Oral BID BM  . levofloxacin (LEVAQUIN) IV  750 mg Intravenous Q24H  . metoprolol tartrate  25 mg Oral BID  . saccharomyces boulardii  250 mg Oral BID  . sodium chloride  3 mL Intravenous Q12H  . vancomycin  750 mg Intravenous Q12H   Continuous Infusions: . heparin 1,300 Units/hr (07/09/14 0317)       Time spent: 35 minutes    [facility]  [facility] Pager [phone] If  7PM-7AM, please contact night-coverage at [email or web address], password [facility] 07/09/2014, 12:15 PM  LOS: 3 days

## 2014-07-09 NOTE — Progress Notes (Addendum)
ANTICOAGULATION and ANTIBIOTIC CONSULT NOTE - Follow Up Consult  Pharmacy Consult for Heparin and Vancomycin Indication: ACS/STEMI and afib; Strep viridans bacteremia  Allergies  Allergen Reactions  . Penicillins     Unable to say what kind of reaction    Patient Measurements: Height: 6' (182.9 cm) Weight: 191 lb (86.637 kg) IBW/kg (Calculated) : 77.6 Heparin Dosing Weight: 82.6 kg  Vital Signs: Temp: 97.9 F (36.6 C) (04/03 1218) Temp Source: Oral (04/03 1218) BP: 127/38 mmHg (04/03 1218) Pulse Rate: 63 (04/03 1218)  Labs:  Recent Labs  07/06/14 1940 07/07/14 0005  07/07/14 0519 07/08/14 0333 07/09/14 0458  HGB  --   --   < > 8.6* 8.6* 9.0*  HCT  --   --   --  27.9* 27.7* 29.0*  PLT  --   --   --  295 280 306  APTT 59*  --   --  102* 80* 63*  LABPROT 15.6*  --   --  15.5*  --   --   INR 1.22  --   --  1.21  --   --   HEPARINUNFRC  --   --   --  0.58 0.25* 0.25*  CREATININE  --   --   --  1.31 1.25 1.24  TROPONINI 4.03* 3.46*  --  3.11*  --   --   < > = values in this interval not displayed.  Estimated Creatinine Clearance: 57.4 mL/min (by C-G formula based on Cr of 1.24).  Assessment:   Heparin level is just subtherapeutic again today (0.25) on 1300 units/hr.  APTT is just below therapeutic (63 seconds).   Was on Eliquis prior to admission; last dose reported 3/29 am.  Recent Eliquis would usually cause increased heparin level, but level now low and aPTT is okay.  Hgb low stable. To check for FOB. C diff negative.   Day # 4 Vanc and Levaquin.  Levaquin to stop on 4/4 per ID.   Plan aspiration of right knee.  1 of 2 blood cultures from 3/31 with gram positive cocci in pairs and chains.  Goal of Therapy:  Heparin level 0.3-0.7 units/ml aPTT 66-102 seconds Monitor platelets by anticoagulation protocol: Yes   Plan:   Increase heparin drip to 1400 units/hr.  Expect drip will need to be held to aspirate knee. Timing per Ortho.  Heparin level, aPTT and CBC in  am.  Will follow up plans.  Follow up for stool guaiacs.   Continue Vancomycin 750 mg IV q12hrs.  Vanc trough level in am 07/10/14.  Will follow renal function, culture data, progress.  Arty Baumgartner, [place] Pager: [phone] 07/09/2014,3:35 PM

## 2014-07-09 NOTE — Progress Notes (Signed)
SUBJECTIVE:  He is very fatigued.  No pain.  Stood a couple of times yesterday   PHYSICAL EXAM Filed Vitals:   07/08/14 0500 07/08/14 2108 07/08/14 2353 07/09/14 0400  BP: 137/35 143/40 130/43 153/39  Pulse: 69 71 72 73  Temp: 98.4 F (36.9 C) 98.4 F (36.9 C) 98.3 F (36.8 C) 98 F (36.7 C)  TempSrc: Oral Oral Oral Oral  Resp: 18 20 20 20   Height:      Weight: 182 lb 3.2 oz (82.645 kg)   191 lb (86.637 kg)  SpO2: 98% 98% 94% 97%   General:  No distress Lungs:  Clear Heart:  RRR Abdomen:  Positive bowel sounds, no rebound no guarding Extremities:  No edema   LABS:  Results for orders placed or performed during the hospital encounter of 07/06/14 (from the past 24 hour(s))  Occult blood card to lab, stool     Status: Abnormal   Collection Time: 07/08/14 12:10 PM  Result Value Ref Range   Fecal Occult Bld POSITIVE (A) NEGATIVE  Clostridium Difficile by PCR     Status: None   Collection Time: 07/09/14 12:42 AM  Result Value Ref Range   C difficile by pcr NEGATIVE NEGATIVE  Heparin level (unfractionated)     Status: Abnormal   Collection Time: 07/09/14  4:58 AM  Result Value Ref Range   Heparin Unfractionated 0.25 (L) 0.30 - 0.70 IU/mL  APTT     Status: Abnormal   Collection Time: 07/09/14  4:58 AM  Result Value Ref Range   aPTT 63 (H) 24 - 37 seconds  Comprehensive metabolic panel     Status: Abnormal   Collection Time: 07/09/14  4:58 AM  Result Value Ref Range   Sodium 139 135 - 145 mmol/L   Potassium 4.3 3.5 - 5.1 mmol/L   Chloride 112 96 - 112 mmol/L   CO2 20 19 - 32 mmol/L   Glucose, Bld 88 70 - 99 mg/dL   BUN 17 6 - 23 mg/dL   Creatinine, Ser 1.24 0.50 - 1.35 mg/dL   Calcium 8.3 (L) 8.4 - 10.5 mg/dL   Total Protein 5.9 (L) 6.0 - 8.3 g/dL   Albumin 2.1 (L) 3.5 - 5.2 g/dL   AST 35 0 - 37 U/L   ALT 26 0 - 53 U/L   Alkaline Phosphatase 72 39 - 117 U/L   Total Bilirubin 0.5 0.3 - 1.2 mg/dL   GFR calc non Af Amer 56 (L) >90 mL/min   GFR calc Af Amer 64  (L) >90 mL/min   Anion gap 7 5 - 15  CBC     Status: Abnormal   Collection Time: 07/09/14  4:58 AM  Result Value Ref Range   WBC 7.9 4.0 - 10.5 K/uL   RBC 3.70 (L) 4.22 - 5.81 MIL/uL   Hemoglobin 9.0 (L) 13.0 - 17.0 g/dL   HCT 29.0 (L) 39.0 - 52.0 %   MCV 78.4 78.0 - 100.0 fL   MCH 24.3 (L) 26.0 - 34.0 pg   MCHC 31.0 30.0 - 36.0 g/dL   RDW 16.9 (H) 11.5 - 15.5 %   Platelets 306 150 - 400 K/uL    Intake/Output Summary (Last 24 hours) at 07/09/14 4193 Last data filed at 07/09/14 0600  Gross per 24 hour  Intake 1539.51 ml  Output    275 ml  Net 1264.51 ml     ASSESSMENT AND PLAN:  ELEVATED CARDIAC ENZYMES:   NSTEMI.  Medical management.  We are not planning cath or intervention.   ATRIAL FIB:   We suggest resuming Eliquis when no further intervention is planned.   He is anemic.  This is probably secondary to chronic disease.  However, I don't see old labs and I don't see a stool guaiac.  I check the guaiac.   CARDIOMYOPATHY:   Continue current meds.  Seems to be euvolemic.   EF is only mildly reduced  SYNCOPE:  No further work up.  Possibly related to other comorbid conditions and acute illness.    BACTEREMIA:  Per ID.  Plan to order a TEE.  I will make him NPO after MN.  Don't know if we will be able to schedule it for tomorrow or Tuesday.  I will leave an Epic message with my staff.   AI/TR/ELEVATED PULMONARY PRESSURES:  Moderate to severe AI on echo yesterday with moderate to severe AI.  Moderate to severe TR.   Medical management with TEE as above.    Jeneen Rinks [facility] 07/09/2014 8:28 AM

## 2014-07-10 ENCOUNTER — Inpatient Hospital Stay (HOSPITAL_COMMUNITY): Payer: Commercial Managed Care - HMO

## 2014-07-10 DIAGNOSIS — I252 Old myocardial infarction: Secondary | ICD-10-CM

## 2014-07-10 DIAGNOSIS — I339 Acute and subacute endocarditis, unspecified: Secondary | ICD-10-CM

## 2014-07-10 DIAGNOSIS — M25661 Stiffness of right knee, not elsewhere classified: Secondary | ICD-10-CM

## 2014-07-10 DIAGNOSIS — R634 Abnormal weight loss: Secondary | ICD-10-CM

## 2014-07-10 DIAGNOSIS — I2510 Atherosclerotic heart disease of native coronary artery without angina pectoris: Secondary | ICD-10-CM

## 2014-07-10 DIAGNOSIS — E785 Hyperlipidemia, unspecified: Secondary | ICD-10-CM

## 2014-07-10 DIAGNOSIS — E43 Unspecified severe protein-calorie malnutrition: Secondary | ICD-10-CM

## 2014-07-10 DIAGNOSIS — I4891 Unspecified atrial fibrillation: Secondary | ICD-10-CM

## 2014-07-10 DIAGNOSIS — D649 Anemia, unspecified: Secondary | ICD-10-CM

## 2014-07-10 DIAGNOSIS — I10 Essential (primary) hypertension: Secondary | ICD-10-CM

## 2014-07-10 LAB — COMPREHENSIVE METABOLIC PANEL
ALT: 24 U/L (ref 0–53)
AST: 33 U/L (ref 0–37)
Albumin: 2 g/dL — ABNORMAL LOW (ref 3.5–5.2)
Alkaline Phosphatase: 69 U/L (ref 39–117)
Anion gap: 7 (ref 5–15)
BUN: 13 mg/dL (ref 6–23)
CALCIUM: 8.2 mg/dL — AB (ref 8.4–10.5)
CHLORIDE: 109 mmol/L (ref 96–112)
CO2: 22 mmol/L (ref 19–32)
CREATININE: 1.28 mg/dL (ref 0.50–1.35)
GFR, EST AFRICAN AMERICAN: 62 mL/min — AB (ref >90)
GFR, EST NON AFRICAN AMERICAN: 53 mL/min — AB (ref >90)
Glucose, Bld: 91 mg/dL (ref 70–99)
POTASSIUM: 4.4 mmol/L (ref 3.5–5.1)
SODIUM: 138 mmol/L (ref 135–145)
Total Bilirubin: 0.6 mg/dL (ref 0.3–1.2)
Total Protein: 5.6 g/dL — ABNORMAL LOW (ref 6.0–8.3)

## 2014-07-10 LAB — APTT: APTT: 102 s — AB (ref 24–37)

## 2014-07-10 LAB — SYNOVIAL CELL COUNT + DIFF, W/ CRYSTALS
CRYSTALS FLUID: NONE SEEN
Eosinophils-Synovial: 1 % (ref 0–1)
LYMPHOCYTES-SYNOVIAL FLD: 20 % (ref 0–20)
Monocyte-Macrophage-Synovial Fluid: 6 % — ABNORMAL LOW (ref 50–90)
Neutrophil, Synovial: 73 % — ABNORMAL HIGH (ref 0–25)
WBC, Synovial: 233 /mm3 — ABNORMAL HIGH (ref 0–200)

## 2014-07-10 LAB — CBC
HCT: 28.7 % — ABNORMAL LOW (ref 39.0–52.0)
Hemoglobin: 8.8 g/dL — ABNORMAL LOW (ref 13.0–17.0)
MCH: 24.4 pg — AB (ref 26.0–34.0)
MCHC: 30.7 g/dL (ref 30.0–36.0)
MCV: 79.5 fL (ref 78.0–100.0)
Platelets: 303 10*3/uL (ref 150–400)
RBC: 3.61 MIL/uL — AB (ref 4.22–5.81)
RDW: 17.3 % — ABNORMAL HIGH (ref 11.5–15.5)
WBC: 7.7 10*3/uL (ref 4.0–10.5)

## 2014-07-10 LAB — VANCOMYCIN, TROUGH: Vancomycin Tr: 17.1 ug/mL (ref 10.0–20.0)

## 2014-07-10 LAB — HEPARIN LEVEL (UNFRACTIONATED): Heparin Unfractionated: 0.33 IU/mL (ref 0.30–0.70)

## 2014-07-10 MED ORDER — HEPARIN (PORCINE) IN NACL 100-0.45 UNIT/ML-% IJ SOLN
1550.0000 [IU]/h | INTRAMUSCULAR | Status: DC
  Filled 2014-07-10: qty 250

## 2014-07-10 MED ORDER — LEVOFLOXACIN IN D5W 750 MG/150ML IV SOLN
750.0000 mg | INTRAVENOUS | Status: AC
  Administered 2014-07-10: 750 mg via INTRAVENOUS
  Filled 2014-07-10: qty 150

## 2014-07-10 NOTE — Progress Notes (Signed)
Pt Profile: 74 y.o. male with a history of atrial fibrillation on Eliquis, CAD, HLD, chronic systolic heart failure (EF 35%), chronic anemia, and HTN who presented to [facility] with a syncopal episode on 07/04/2014. He eventually ruled in for NSTEMI and was transferred to [facility] today for evaluation by [facility].  He is followed by Dr. Clayborn Bigness for cardiology. He was hospitalized earlier in March with angina, NSTEMI and had a cardiac catheterization, which showed two-vessel disease not amenable to intervention. Medical management was recommended. He was sent home on medical therapy.   Now admitted with syncope and bacteremia   Subjective: No chest pain no SOB today.  Weak with sitting up  Objective: Vital signs in last 24 hours: Temp:  [97.5 F (36.4 C)-98.5 F (36.9 C)] 97.5 F (36.4 C) (04/04 0810) Pulse Rate:  [62-71] 67 (04/04 0810) Resp:  [18-20] 18 (04/04 0810) BP: (118-145)/(33-38) 130/34 mmHg (04/04 0810) SpO2:  [94 %-100 %] 96 % (04/04 0810) Weight:  [187 lb (84.823 kg)] 187 lb (84.823 kg) (04/04 0400) Weight change: -4 lb (-1.814 kg) Last BM Date: 07/10/14 Intake/Output from previous day: 04/03 0701 - 04/04 0700 In: 473.5 [I.V.:323.5; IV Piggyback:150] Out: 825 [Urine:825] Intake/Output this shift: Total I/O In: -  Out: 125 [Urine:125]  PE: General:Pleasant affect, NAD, though appears ill Skin:Warm and dry, brisk capillary refill HEENT:normocephalic, sclera clear, mucus membranes moist Heart:S1S2 RRR with 3-3/8 systolic murmur, no gallup, rub or click Lungs:clear without rales, rhonchi, or wheezes SNK:NLZJ, non tender, + BS, do not palpate liver spleen or masses Ext:no lower ext edema, 2+ pedal pulses, 2+ radial pulses Neuro:alert and oriented, MAE, follows commands, + facial symmetry Tele:  SR with PVCs on occ.    Lab Results:  Recent Labs  07/09/14 0458 07/10/14 0530  WBC 7.9 7.7  HGB 9.0* 8.8*  HCT 29.0* 28.7*  PLT 306 303    BMET  Recent Labs  07/09/14 0458 07/10/14 0530  NA 139 138  K 4.3 4.4  CL 112 109  CO2 20 22  GLUCOSE 88 91  BUN 17 13  CREATININE 1.24 1.28  CALCIUM 8.3* 8.2*   No results for input(s): TROPONINI in the last 72 hours.  Invalid input(s): CK, MB  Lab Results  Component Value Date   CHOL 82 07/07/2014   HDL 18* 07/07/2014   LDLCALC 43 07/07/2014   TRIG 103 07/07/2014   CHOLHDL 4.6 07/07/2014   Lab Results  Component Value Date   HGBA1C 5.9* 07/06/2014     Lab Results  Component Value Date   TSH 2.343 07/06/2014    Hepatic Function Panel  Recent Labs  07/10/14 0530  PROT 5.6*  ALBUMIN 2.0*  AST 33  ALT 24  ALKPHOS 69  BILITOT 0.6   No results for input(s): CHOL in the last 72 hours. No results for input(s): PROTIME in the last 72 hours.     Studies/Results: ECHO Left ventricle: The cavity size was mildly dilated. There was mild concentric hypertrophy. Systolic function was normal. The estimated ejection fraction was in the range of 50% to 55%. Features are consistent with a pseudonormal left ventricular filling pattern, with concomitant abnormal relaxation and increased filling pressure (grade 2 diastolic dysfunction). - Regional wall motion abnormality: Mild hypokinesis of the mid-apical anterior, mid anterolateral, apical lateral, and apical myocardium. - Aortic valve: Trileaflet; mildly thickened, mildly calcified leaflets. There was no stenosis. There was moderate to severe regurgitation. - Mitral valve: Mildly thickened leaflets .  There was mild regurgitation. - Left atrium: The atrium was mildly dilated. Volume/bsa, ES, (1-plane Simpson&'s, A2C): 30.1 ml/m^2. - Right ventricle: Systolic function was mildly reduced. - Right atrium: The atrium was mildly dilated. - Tricuspid valve: There was moderate-severe regurgitation. - Pulmonary arteries: PA peak pressure: 69 mm Hg (S). Severely elevated pulmonary  pressures. - Inferior vena cava: The vessel was dilated. The respirophasic diameter changes were blunted (< 50%), consistent with elevated central venous pressure.  Medications: I have reviewed the patient's current medications. Scheduled Meds: . amiodarone  200 mg Oral Daily  . aspirin EC  81 mg Oral Daily  . atorvastatin  40 mg Oral QHS  . digoxin  125 mcg Oral Daily  . feeding supplement (ENSURE ENLIVE)  237 mL Oral BID BM  . levofloxacin (LEVAQUIN) IV  750 mg Intravenous Q24H  . metoprolol tartrate  25 mg Oral BID  . saccharomyces boulardii  250 mg Oral BID  . sodium chloride  3 mL Intravenous Q12H  . vancomycin  750 mg Intravenous Q12H   Continuous Infusions: . heparin 1,400 Units/hr (07/09/14 2312)   PRN Meds:.sodium chloride, acetaminophen, calcium carbonate, nitroGLYCERIN, ondansetron (ZOFRAN) IV, sodium chloride  Assessment/Plan: Principal Problem:   Viridans streptococci infection- plan for TEE for 07/11/14 OK per cards for diet today on Levaquin  Active Problems:   NSTEMI (non-ST elevated myocardial infarction)  Pk troponin prior to transfer 11-  Dr. Clayborn Bigness recommended CABG  medical management deep t wave inversions lat. On IV Heparin --reviewed by Drs Angelena Form and Nahser, their evaluation: The LAD has moderate disease but is not seen very well ijn all views. The LCx has a tight stenosis but is not a good target for CABG - it primarily fills a small , subtotalled OM ( that already has collateral flow). The RCA has distal disease and could potentially be stented  -on BB, no ACE due to AKI at [facility] now with Cr 1.28     Bacteremia    Atrial fibrillation- maintaining SR here was on eliquis and on amiodarone On IV heparin and dig. CHAD2S2VASc score 4- resume eliquis once TEE complete    Chronic systolic CHF (congestive heart failure) EF 35%-- now with recent Echo EF 50-55% with G2DD moderate to severe AI, mild MR mod to severe TR and PA pk pressure 69 mmHg      Right lower lobe pneumonia per IM, ID   Protein-calorie malnutrition, severe per IM   Unintentional weight loss per IM   Acute pain of right knee- for aspiration today    Normocytic anemia- chronic H/H 8.8/28.7-per IM   Hypertension- BP 130/34 per IM   Coronary artery disease- see above    Dyslipidemia    LOS: 4 days   Time spent with pt. :20 minutes. [facility] R  Nurse Practitioner Certified Pager [phone] or after 5pm and on weekends call [phone] 07/10/2014, 10:21 AM

## 2014-07-10 NOTE — Care Management Note (Addendum)
    Page 1 of 2   07/15/2014     5:07:37 PM CARE MANAGEMENT NOTE 07/15/2014  Patient:  Joe Kennedy, Joe Kennedy   Account Number:  [id number]  Date Initiated:  07/07/2014  Documentation initiated by:  Olga Coaster  Subjective/Objective Assessment:   ADMITTED WITH NSTEMI, BACTEREMIA     Action/Plan:   CM FOLLOWING FOR DCP   Anticipated DC Date:  07/12/2014   Anticipated DC Plan:  [place]  CM consult      [facility] Choice  HOME HEALTH   Choice offered to / List presented to:  C-1 Patient   DME arranged  IV PUMP/EQUIPMENT  3-N-1  Vassie Moselle      DME agency  [place] arranged  HH-1 RN  HH-10 DISEASE MANAGEMENT  HH-2 PT  HH-3 OT      [place].   Status of service:  In process, will continue to follow Medicare Important Message given?  YES (If response is "NO", the following Medicare IM given date fields will be blank) Date Medicare IM given:  07/10/2014 Medicare IM given by:  GRAVES-BIGELOW,BRENDA Date Additional Medicare IM given:  07/14/2014 Additional Medicare IM given by:  [place]  Discharge Disposition:  [place]  Per UR Regulation:  Reviewed for med. necessity/level of care/duration of stay  If discussed at [place] of Stay Meetings, dates discussed:   07/13/2014    Comments:  07/15/14 17:00 CM received call [place] rep asking last Vanc dose: CM reports at 09:00am this day per [facility] report.  No other CM needs were communicated.  Mariane Masters, BSN, [place] [phone].  07-14-14 [address] RN, BSN [phone] CM did speak with pt in regards to [facility] services and Referral sent to [facility] for services. IV ABX Rx to be written for [facility] today. DME RW and 3N1 to be delivered by [facility]. Pt was given an Eliquis Card.  07-12-14 [place], [place] [phone] Per PT recommendations plan for [facility]  services. If the plan is for home will need [facility] services for RN to manage IV ABX therapy. CM did speak with pt and wife they would like to use [facility] for services. Referral needs to be made with [facility] for [facility] and PT services if the plan continues to be IV antibiotics. Dental to consult.  CM will monitor for disposition needs.  4/1/2016Mindi Slicker RN,BSN,MHA [phone]

## 2014-07-10 NOTE — Progress Notes (Addendum)
ANTICOAGULATION CONSULT NOTE - Follow Up Consult  Pharmacy Consult for heparin Indication: afib/NSTEMI  Allergies  Allergen Reactions  . Penicillins     Unable to say what kind of reaction    Patient Measurements: Height: 6' (182.9 cm) Weight: 187 lb (84.823 kg) IBW/kg (Calculated) : 77.6 Heparin Dosing Weight: 82.6kg  Vital Signs: Temp: 97.3 F (36.3 C) (04/04 1157) Temp Source: Oral (04/04 1157) BP: 135/38 mmHg (04/04 1157) Pulse Rate: 64 (04/04 1157)  Labs:  Recent Labs  07/08/14 0333 07/09/14 0458 07/10/14 0530  HGB 8.6* 9.0* 8.8*  HCT 27.7* 29.0* 28.7*  PLT 280 306 303  APTT 80* 63* 102*  HEPARINUNFRC 0.25* 0.25* 0.33  CREATININE 1.25 1.24 1.28    Estimated Creatinine Clearance: 55.6 mL/min (by C-G formula based on Cr of 1.28).   Medications:  Scheduled:  . amiodarone  200 mg Oral Daily  . aspirin EC  81 mg Oral Daily  . atorvastatin  40 mg Oral QHS  . digoxin  125 mcg Oral Daily  . feeding supplement (ENSURE ENLIVE)  237 mL Oral BID BM  . metoprolol tartrate  25 mg Oral BID  . saccharomyces boulardii  250 mg Oral BID  . sodium chloride  3 mL Intravenous Q12H  . vancomycin  750 mg Intravenous Q12H    Assessment: 74 yo male on heparin for afib/NSTEMI (on xarelto PTA for afib). He is now s/p  R knee aspiration and to restart heparin. Heparin was last infusing at 1400 units/hr with heparin level = 0.44 and aPTT= 102.   Last Xarelto was 07/04/14 and influence on heparin levels should be minimal. Per cardiology plans to switch back to Xarelto s/p TEE.  Goal of Therapy:  Heparin level 0.3-0.7 units/ml Monitor platelets by anticoagulation protocol: Yes   Plan: -Restart heparin at 1400 units/hr at 4pm -Heparin level in 8 hours -Daily heparin level and CBC   Hildred Laser, Pharm D 07/10/2014 3:32 PM

## 2014-07-10 NOTE — Progress Notes (Signed)
[facility] PROGRESS NOTE   ELIAZ FOUT KGM:[id number] DOB: [birth year] DOA: 07/06/2014 PCP: No primary care provider on file.  HPI/Subjective: Crampy abdominal pain, loose stool  Assessment/Plan: Principal Problem:   Viridans streptococci infection Active Problems:   NSTEMI (non-ST elevated myocardial infarction)   Bacteremia   Atrial fibrillation   Chronic systolic CHF (congestive heart failure)   Right lower lobe pneumonia   Protein-calorie malnutrition, severe   Unintentional weight loss   Acute pain of right knee   Normocytic anemia   Hypertension   Coronary artery disease   Dyslipidemia   Non-STEMI in the setting of sepsis Patient presented to [facility] with syncope. Troponin went up to 11, admitted under cardiology initially. -No CP overnight -Troponin levels: 4.03, 3.46, 3.11 -2D echo  Shows an EF of 55-60% , grade 2 diastolic dysfunction, -EKG with T wave inversion in Leads II and lateral leads, documented in [facility] H&P - cath at [facility] within last 2 weeks, reviewed by Drs Angelena Form and Nahser, their evaluation: The LAD has moderate disease but is not seen very well ijn all views. The LCx has a tight stenosis but is not a good target for CABG - it primarily fills a small , subtotalled OM ( that already has collateral flow). The RCA has distal disease and could potentially be stented  - Continue aspirin, heparin drip  As per cardiology, statin and beta blocker There are no plans for cardiac catheterization    Chronic systolic CHF (congestive heart failure) -Last known EF 35% by LV gram on last heart cath,Moderate to severe AI on echo yesterday with moderate to severe AI. Moderate to severe TR Continue current medications, appears to be euvolemic -Continue BB -Continue to hold ACE d/t hypotension and recent AKI, renal function stable   Streptococcus viridans Bacteremia  According to results from [place] [facility] gram-positive cocci  bacteremia in 2 bottles. This is likely secondary to pneumonia, patient already on vancomycin/Levaquin. Repeat blood culture 3/31, 4/1 no growth so far Infectious disease Dr. Megan Salon consulted , recommending TEE 4/4 Arthrocentesis right knee  today Hold heparin for 4 hrs prior to knee tap  Diarrhea Improving, Switched levofloxacin to IV as patient unable to tolerate by mouth C. difficile PCR negative  Continue florastor   Right lower lobe pneumonia X-rays from [place] [facility] yesterday showed probable RLL. Continue antibiotics  Atrial fibrillation Patient was on Eliquis as outpatient, currently on heparin drip for the non-STEMI. Rate is controlled,  Probably DC heparin and switch to Eliquis today, CHA2DS2-VASc score of 3  Hypokalemia Repleted   Code Status: Full Code Family Communication: Plan discussed with the patient. Disposition Plan: PT/OT eval  Diet: Diet NPO time specified Except for: Sips with Meds  Consultants:  Cardiology  Procedures:  None  Antibiotics:  Vancomycin   Objective: Filed Vitals:   07/10/14 0810  BP: 130/34  Pulse: 67  Temp: 97.5 F (36.4 C)  Resp: 18    Intake/Output Summary (Last 24 hours) at 07/10/14 1118 Last data filed at 07/10/14 0811  Gross per 24 hour  Intake 473.53 ml  Output    950 ml  Net -476.47 ml   Filed Weights   07/08/14 0500 07/09/14 0400 07/10/14 0400  Weight: 82.645 kg (182 lb 3.2 oz) 86.637 kg (191 lb) 84.823 kg (187 lb)    Exam: General: Well developed, well nourished, male in no acute distress. Head: Normocephalic, atraumatic. Neck: Supple without bruits, no JVD. Lungs: Resp regular and unlabored, CTA. Mild rales at  bases. Heart: RRR, S1, S2, no S3, S4, 3/6 diastolic murmur; no rub. Abdomen: Soft, non-tender, non-distended, BS + x 4.  Extremities: No clubbing, cyanosis, no edema.  Neuro: a little sleepy, but awake and oriented X 3. Moves all extremities spontaneously. Psych: Normal  affect.  Data Reviewed: Basic Metabolic Panel:  Recent Labs Lab 07/06/14 1940 07/07/14 0519 07/08/14 0333 07/09/14 0458 07/10/14 0530  NA  --  139 138 139 138  K  --  3.2* 3.4* 4.3 4.4  CL  --  110 110 112 109  CO2  --  25 23 20 22   GLUCOSE  --  81 88 88 91  BUN  --  20 22 17 13   CREATININE  --  1.31 1.25 1.24 1.28  CALCIUM  --  8.1* 8.0* 8.3* 8.2*  MG 2.1  --   --   --   --    Liver Function Tests:  Recent Labs Lab 07/07/14 0519 07/08/14 0333 07/09/14 0458 07/10/14 0530  AST 31 37 35 33  ALT 21 25 26 24   ALKPHOS 64 66 72 69  BILITOT 0.6 0.8 0.5 0.6  PROT 5.3* 5.4* 5.9* 5.6*  ALBUMIN 1.9* 1.9* 2.1* 2.0*   No results for input(s): LIPASE, AMYLASE in the last 168 hours. No results for input(s): AMMONIA in the last 168 hours. CBC:  Recent Labs Lab 07/07/14 0519 07/08/14 0333 07/09/14 0458 07/10/14 0530  WBC 9.1 7.9 7.9 7.7  HGB 8.6* 8.6* 9.0* 8.8*  HCT 27.9* 27.7* 29.0* 28.7*  MCV 78.8 78.2 78.4 79.5  PLT 295 280 306 303   Cardiac Enzymes:  Recent Labs Lab 07/06/14 1940 07/07/14 0005 07/07/14 0519  TROPONINI 4.03* 3.46* 3.11*   BNP (last 3 results)  Recent Labs  07/06/14 1940  BNP 779.9*    ProBNP (last 3 results) No results for input(s): PROBNP in the last 8760 hours.  CBG: No results for input(s): GLUCAP in the last 168 hours.  Micro Recent Results (from the past 240 hour(s))  Culture, blood (routine x 2)     Status: None (Preliminary result)   Collection Time: 07/06/14  7:40 PM  Result Value Ref Range Status   Specimen Description BLOOD LEFT ANTECUBITAL  Final   Special Requests BOTTLES DRAWN AEROBIC AND ANAEROBIC 3CC EA  Final   Culture   Final    GRAM POSITIVE COCCI IN PAIRS AND CHAINS Note: Gram Stain Report Called to,Read Back By and Verified With: DEVON RN ON 3W AT 1412 [id number] BY CASTC Performed at [facility]    Report Status PENDING  Incomplete  Culture, blood (routine x 2)     Status: None (Preliminary result)    Collection Time: 07/06/14  7:50 PM  Result Value Ref Range Status   Specimen Description BLOOD LEFT HAND  Final   Special Requests BOTTLES DRAWN AEROBIC ONLY 2CC  Final   Culture   Final           BLOOD CULTURE RECEIVED NO GROWTH TO DATE CULTURE WILL BE HELD FOR 5 DAYS BEFORE ISSUING A FINAL NEGATIVE REPORT Performed at [facility]    Report Status PENDING  Incomplete  Culture, blood (routine x 2)     Status: None (Preliminary result)   Collection Time: 07/07/14 10:46 AM  Result Value Ref Range Status   Specimen Description BLOOD LEFT ARM  Final   Special Requests BOTTLES DRAWN AEROBIC AND ANAEROBIC 10CC EACH  Final   Culture   Final  BLOOD CULTURE RECEIVED NO GROWTH TO DATE CULTURE WILL BE HELD FOR 5 DAYS BEFORE ISSUING A FINAL NEGATIVE REPORT Performed at [facility]    Report Status PENDING  Incomplete  Culture, blood (routine x 2)     Status: None (Preliminary result)   Collection Time: 07/07/14 10:53 AM  Result Value Ref Range Status   Specimen Description BLOOD LEFT HAND  Final   Special Requests BOTTLES DRAWN AEROBIC AND ANAEROBIC 10CC EACH  Final   Culture   Final           BLOOD CULTURE RECEIVED NO GROWTH TO DATE CULTURE WILL BE HELD FOR 5 DAYS BEFORE ISSUING A FINAL NEGATIVE REPORT Performed at [facility]    Report Status PENDING  Incomplete  Clostridium Difficile by PCR     Status: None   Collection Time: 07/09/14 12:42 AM  Result Value Ref Range Status   C difficile by pcr NEGATIVE NEGATIVE Final     Studies: No results found.  Scheduled Meds: . amiodarone  200 mg Oral Daily  . aspirin EC  81 mg Oral Daily  . atorvastatin  40 mg Oral QHS  . digoxin  125 mcg Oral Daily  . feeding supplement (ENSURE ENLIVE)  237 mL Oral BID BM  . levofloxacin (LEVAQUIN) IV  750 mg Intravenous Q24H  . metoprolol tartrate  25 mg Oral BID  . saccharomyces boulardii  250 mg Oral BID  . sodium chloride  3 mL Intravenous Q12H  . vancomycin   750 mg Intravenous Q12H   Continuous Infusions: . heparin 1,400 Units/hr (07/09/14 2312)       Time spent: 35 minutes    [facility]  [facility] Pager [phone] If 7PM-7AM, please contact night-coverage at [email or web address], password [facility] 07/10/2014, 11:18 AM  LOS: 4 days

## 2014-07-10 NOTE — Progress Notes (Signed)
ANTIBIOTIC CONSULT NOTE - FOLLOW UP  Pharmacy Consult for vancomycin Indication: bacteremia  Allergies  Allergen Reactions  . Penicillins     Unable to say what kind of reaction    Patient Measurements: Height: 6' (182.9 cm) Weight: 187 lb (84.823 kg) IBW/kg (Calculated) : 77.6  Vital Signs: Temp: 97.5 F (36.4 C) (04/04 0810) Temp Source: Oral (04/04 0810) BP: 130/34 mmHg (04/04 0810) Pulse Rate: 67 (04/04 0810) Intake/Output from previous day: 04/03 0701 - 04/04 0700 In: 473.5 [I.V.:323.5; IV Piggyback:150] Out: 825 [Urine:825] Intake/Output from this shift: Total I/O In: -  Out: 125 [Urine:125]  Labs:  Recent Labs  07/08/14 0333 07/09/14 0458 07/10/14 0530  WBC 7.9 7.9 7.7  HGB 8.6* 9.0* 8.8*  PLT 280 306 303  CREATININE 1.25 1.24 1.28   Estimated Creatinine Clearance: 55.6 mL/min (by C-G formula based on Cr of 1.28).  Recent Labs  07/10/14 0727  VANCOTROUGH 17.1     Microbiology: Recent Results (from the past 720 hour(s))  Culture, blood (routine x 2)     Status: None (Preliminary result)   Collection Time: 07/06/14  7:40 PM  Result Value Ref Range Status   Specimen Description BLOOD LEFT ANTECUBITAL  Final   Special Requests BOTTLES DRAWN AEROBIC AND ANAEROBIC 3CC EA  Final   Culture   Final    GRAM POSITIVE COCCI IN PAIRS AND CHAINS Note: Gram Stain Report Called to,Read Back By and Verified With: DEVON RN ON 3W AT 1412 [id number] BY CASTC Performed at [facility]    Report Status PENDING  Incomplete  Culture, blood (routine x 2)     Status: None (Preliminary result)   Collection Time: 07/06/14  7:50 PM  Result Value Ref Range Status   Specimen Description BLOOD LEFT HAND  Final   Special Requests BOTTLES DRAWN AEROBIC ONLY 2CC  Final   Culture   Final           BLOOD CULTURE RECEIVED NO GROWTH TO DATE CULTURE WILL BE HELD FOR 5 DAYS BEFORE ISSUING A FINAL NEGATIVE REPORT Performed at [facility]    Report Status PENDING   Incomplete  Culture, blood (routine x 2)     Status: None (Preliminary result)   Collection Time: 07/07/14 10:46 AM  Result Value Ref Range Status   Specimen Description BLOOD LEFT ARM  Final   Special Requests BOTTLES DRAWN AEROBIC AND ANAEROBIC 10CC EACH  Final   Culture   Final           BLOOD CULTURE RECEIVED NO GROWTH TO DATE CULTURE WILL BE HELD FOR 5 DAYS BEFORE ISSUING A FINAL NEGATIVE REPORT Performed at [facility]    Report Status PENDING  Incomplete  Culture, blood (routine x 2)     Status: None (Preliminary result)   Collection Time: 07/07/14 10:53 AM  Result Value Ref Range Status   Specimen Description BLOOD LEFT HAND  Final   Special Requests BOTTLES DRAWN AEROBIC AND ANAEROBIC 10CC EACH  Final   Culture   Final           BLOOD CULTURE RECEIVED NO GROWTH TO DATE CULTURE WILL BE HELD FOR 5 DAYS BEFORE ISSUING A FINAL NEGATIVE REPORT Performed at [facility]    Report Status PENDING  Incomplete  Clostridium Difficile by PCR     Status: None   Collection Time: 07/09/14 12:42 AM  Result Value Ref Range Status   C difficile by pcr NEGATIVE NEGATIVE Final  Anti-infectives    Start     Dose/Rate Route Frequency Ordered Stop   07/10/14 1200  levofloxacin (LEVAQUIN) IVPB 750 mg     750 mg 100 mL/hr over 90 Minutes Intravenous Every 24 hours 07/10/14 0708 07/11/14 1159   07/08/14 1200  levofloxacin (LEVAQUIN) IVPB 750 mg  Status:  Discontinued     750 mg 100 mL/hr over 90 Minutes Intravenous Every 24 hours 07/08/14 1124 07/10/14 0708   07/07/14 1000  levofloxacin (LEVAQUIN) tablet 750 mg  Status:  Discontinued     750 mg Oral Daily 07/06/14 1656 07/06/14 1734   07/07/14 1000  levofloxacin (LEVAQUIN) tablet 750 mg  Status:  Discontinued     750 mg Oral Every 48 hours 07/06/14 1734 07/07/14 0954   07/07/14 1000  levofloxacin (LEVAQUIN) tablet 750 mg  Status:  Discontinued     750 mg Oral Daily 07/07/14 0954 07/08/14 1124   07/06/14 2200  vancomycin  (VANCOCIN) IVPB 750 mg/150 ml premix     750 mg 150 mL/hr over 60 Minutes Intravenous Every 12 hours 07/06/14 1734        Assessment: 74 yo male with viridans streptococcal bacteremia (cultures from [place]) , pneumonia and possible septic arthritis of his right knee on vancomycin and levaquin. WBC= 7.7, afebrile, SCr= 1.28 and CrCl ~ 55. A vancomycin trough today was 17.1 and noted at goal.  The patient is completing his last dose of levaquin today. He is for joint aspiration today and also plans for TEE (possible bacterial endocarditis).   Vanc 3/31> VT= 17.1 on 4/4 Levaquin 3/31> 4/4  4/4 R knee fluid 4/3 C diff - neg 4/1 blood x 2 - ngtd 3/31 blood x 2 - GPC in pairs and chains in 1 of 2 cx  Goal of Therapy:  Vancomycin trough level 15-20 mcg/ml  Plan:  -No vancomycin changes needed -Will follow renal function, cultures and clinical progress  Hildred Laser, Pharm D 07/10/2014 11:54 AM

## 2014-07-10 NOTE — Procedures (Signed)
  CLINICAL DATA: [Right knee swelling] EXAM: RIGHT KNEE ASPIRATION UNDER FLUOROSCOPY FLUOROSCOPY TIME: Radiation Exposure Index (as provided by the fluoroscopic device): [2.3 microGy*m^2] PROCEDURE: I discussed the risks (including hemorrhage, infection, and allergic reaction, among others), benefits, and alternatives to the procedure with the patient. We specifically discussed the likelihood of success of the procedure. The patient understood and elected to undergo the procedure.  Standard time-out was employed.  The suprapatellar bursal region was wrapped with a compression bandage in order to force fluid down into the joint.  Following sterile skin prep and local anesthetic administration consisting of 1% lidocaine, an 18 gauge needle was advanced into the knee joint from a medial approach below the patella.  I obtained in image to show positioning.  A total of 2 cc of sanguinous fluid was withdrawn.  Despite repositioning so I was unable to get more fluid from the joint.  The needle was subsequently removed and the skin cleansed and bandaged. No immediate complications were observed.   IMPRESSION: [ 1.   Right knee arthrocentesis yielded 2 cc of sanguinous fluid. Despite repositioning side I was unable to get or joint fluid.  2.  Heparin can be restarted. ]

## 2014-07-10 NOTE — Progress Notes (Signed)
[place] for Infectious Disease    Subjective: No new complaints   Antibiotics:  Anti-infectives    Start     Dose/Rate Route Frequency Ordered Stop   07/10/14 1200  levofloxacin (LEVAQUIN) IVPB 750 mg     750 mg 100 mL/hr over 90 Minutes Intravenous Every 24 hours 07/10/14 0708 07/10/14 1314   07/08/14 1200  levofloxacin (LEVAQUIN) IVPB 750 mg  Status:  Discontinued     750 mg 100 mL/hr over 90 Minutes Intravenous Every 24 hours 07/08/14 1124 07/10/14 0708   07/07/14 1000  levofloxacin (LEVAQUIN) tablet 750 mg  Status:  Discontinued     750 mg Oral Daily 07/06/14 1656 07/06/14 1734   07/07/14 1000  levofloxacin (LEVAQUIN) tablet 750 mg  Status:  Discontinued     750 mg Oral Every 48 hours 07/06/14 1734 07/07/14 0954   07/07/14 1000  levofloxacin (LEVAQUIN) tablet 750 mg  Status:  Discontinued     750 mg Oral Daily 07/07/14 0954 07/08/14 1124   07/06/14 2200  vancomycin (VANCOCIN) IVPB 750 mg/150 ml premix     750 mg 150 mL/hr over 60 Minutes Intravenous Every 12 hours 07/06/14 1734        Medications: Scheduled Meds: . amiodarone  200 mg Oral Daily  . aspirin EC  81 mg Oral Daily  . atorvastatin  40 mg Oral QHS  . digoxin  125 mcg Oral Daily  . feeding supplement (ENSURE ENLIVE)  237 mL Oral BID BM  . metoprolol tartrate  25 mg Oral BID  . saccharomyces boulardii  250 mg Oral BID  . sodium chloride  3 mL Intravenous Q12H  . vancomycin  750 mg Intravenous Q12H   Continuous Infusions: . heparin 1,400 Units/hr (07/10/14 1559)   PRN Meds:.sodium chloride, acetaminophen, calcium carbonate, nitroGLYCERIN, ondansetron (ZOFRAN) IV, sodium chloride    Objective: Weight change: -4 lb (-1.814 kg)  Intake/Output Summary (Last 24 hours) at 07/10/14 1603 Last data filed at 07/10/14 1518  Gross per 24 hour  Intake 713.53 ml  Output   1075 ml  Net -361.47 ml   Blood pressure 134/39, pulse 66, temperature 97.8 F (36.6 C), temperature source Oral, resp. rate 18,  height 6' (1.829 m), weight 187 lb (84.823 kg), SpO2 99 %. Temp:  [97.3 F (36.3 C)-98.5 F (36.9 C)] 97.8 F (36.6 C) (04/04 1550) Pulse Rate:  [62-71] 66 (04/04 1550) Resp:  [18-20] 18 (04/04 1550) BP: (118-145)/(33-39) 134/39 mmHg (04/04 1550) SpO2:  [94 %-100 %] 99 % (04/04 1550) Weight:  [187 lb (84.823 kg)] 187 lb (84.823 kg) (04/04 0400)  Physical Exam: General: Alert and awake, oriented x3, not in any acute distress. HEENT: anicteric sclera, sclera clear EOMI he has very poor dentition CVS irr regular rate, normal r,  no murmur rubs or gallops Chest: clear to auscultation bilaterally, no wheezing, rales or rhonchi Abdomen: soft nontender, nondistended, normal bowel sounds, Knee is tender swollen Skin: no rashes, no oslers, Janeway's  Neuro: nonfocal  CBC: @LABBLAST3 (wbc3,Hgb:3,Hct:3,Plt:3,INR:3APTT:3)@   BMET  Recent Labs  07/09/14 0458 07/10/14 0530  NA 139 138  K 4.3 4.4  CL 112 109  CO2 20 22  GLUCOSE 88 91  BUN 17 13  CREATININE 1.24 1.28  CALCIUM 8.3* 8.2*     Liver Panel   Recent Labs  07/09/14 0458 07/10/14 0530  PROT 5.9* 5.6*  ALBUMIN 2.1* 2.0*  AST 35 33  ALT 26 24  ALKPHOS 72 69  BILITOT 0.5 0.6  Sedimentation Rate No results for input(s): ESRSEDRATE in the last 72 hours. C-Reactive Protein No results for input(s): CRP in the last 72 hours.  Micro Results: Recent Results (from the past 720 hour(s))  Culture, blood (routine x 2)     Status: None (Preliminary result)   Collection Time: 07/06/14  7:40 PM  Result Value Ref Range Status   Specimen Description BLOOD LEFT ANTECUBITAL  Final   Special Requests BOTTLES DRAWN AEROBIC AND ANAEROBIC 3CC EA  Final   Culture   Final    GRAM POSITIVE COCCI IN PAIRS AND CHAINS Note: Gram Stain Report Called to,Read Back By and Verified With: DEVON RN ON 3W AT 1412 [id number] BY CASTC Performed at [facility]    Report Status PENDING  Incomplete  Culture, blood (routine x 2)      Status: None (Preliminary result)   Collection Time: 07/06/14  7:50 PM  Result Value Ref Range Status   Specimen Description BLOOD LEFT HAND  Final   Special Requests BOTTLES DRAWN AEROBIC ONLY 2CC  Final   Culture   Final           BLOOD CULTURE RECEIVED NO GROWTH TO DATE CULTURE WILL BE HELD FOR 5 DAYS BEFORE ISSUING A FINAL NEGATIVE REPORT Performed at [facility]    Report Status PENDING  Incomplete  Culture, blood (routine x 2)     Status: None (Preliminary result)   Collection Time: 07/07/14 10:46 AM  Result Value Ref Range Status   Specimen Description BLOOD LEFT ARM  Final   Special Requests BOTTLES DRAWN AEROBIC AND ANAEROBIC 10CC EACH  Final   Culture   Final           BLOOD CULTURE RECEIVED NO GROWTH TO DATE CULTURE WILL BE HELD FOR 5 DAYS BEFORE ISSUING A FINAL NEGATIVE REPORT Performed at [facility]    Report Status PENDING  Incomplete  Culture, blood (routine x 2)     Status: None (Preliminary result)   Collection Time: 07/07/14 10:53 AM  Result Value Ref Range Status   Specimen Description BLOOD LEFT HAND  Final   Special Requests BOTTLES DRAWN AEROBIC AND ANAEROBIC 10CC EACH  Final   Culture   Final           BLOOD CULTURE RECEIVED NO GROWTH TO DATE CULTURE WILL BE HELD FOR 5 DAYS BEFORE ISSUING A FINAL NEGATIVE REPORT Performed at [facility]    Report Status PENDING  Incomplete  Clostridium Difficile by PCR     Status: None   Collection Time: 07/09/14 12:42 AM  Result Value Ref Range Status   C difficile by pcr NEGATIVE NEGATIVE Final    Studies/Results: Dg Fluoro Guide Ndl Plcd/bx/inj/loc  07/10/2014   CLINICAL DATA:  Right knee swelling  EXAM: RIGHT KNEE ASPIRATION UNDER FLUOROSCOPY  FLUOROSCOPY TIME:  Radiation Exposure Index (as provided by the fluoroscopic device): 2.3 microGy*m^2  PROCEDURE: PROCEDURE  I discussed the risks (including hemorrhage, infection, and allergic reaction, among others), benefits, and alternatives to  the procedure with the patient. We specifically discussed the likelihood of success of the procedure. The patient understood and elected to undergo the procedure.  Standard time-out was employed. The suprapatellar bursal region was wrapped with a compression bandage in order to force fluid down into the joint. Following sterile skin prep and local anesthetic administration consisting of 1% lidocaine, an 18 gauge needle was advanced into the knee joint from a medial approach below the patella. I  obtained in image to show positioning. A total of 2 cc of sanguinous fluid was withdrawn. Despite repositioning so I was unable to get more fluid from the joint. The needle was subsequently removed and the skin cleansed and bandaged. No immediate complications were observed.  IMPRESSION: 1. Right knee arthrocentesis yielded 2 cc of sanguinous fluid. Despite repositioning side I was unable to get or joint fluid.   Electronically Signed   By: Van Clines M.D.   On: 07/10/2014 14:09      Assessment/Plan:  Principal Problem:   Viridans streptococci infection Active Problems:   NSTEMI (non-ST elevated myocardial infarction)   Bacteremia   Atrial fibrillation   Chronic systolic CHF (congestive heart failure)   Right lower lobe pneumonia   Protein-calorie malnutrition, severe   Unintentional weight loss   Acute pain of right knee   Normocytic anemia   Hypertension   Coronary artery disease   Dyslipidemia    Joe Kennedy is a 74 y.o. male with  Poor dentition who in fact has as recently as a year ago pulled his out teeth out when they "went bad" who resents with an approximately 4 month history of poor appetite and anorexia and 35 pound weight loss. This had developed after he developed knee pain in his right knee which had failed to improve. Ultimately he had a syncopal episode in his doctor's office and was brought down [place]. Blood cultures were done there and have shown  him to have viridans group streptococci in 2 out of 2 blood cultures. The penicillin MIC is 0.125.  He has a childhood history and young adult history of severe penicillin allergy with his face and throat swelling up. He does not recall being treated with penicillin or cephalosporin since then.  #1 Viridans group Streptoccal bacterermia and likely endocarditis:  --agree with transesophageal echocardiogram.  --Continue vancomycin given severe penicillin allergy  --Obtain Panorex to evaluate teeth which very likely where the source of his infection.  --he will likely need 4 weeks of IV vancomycin   #2 knee pain: Radiology was not able to obtain fluid from the knee. Consider MRI of the knee if pain still persists and we worry still about potential nidus of infection there.  #3 35 pound weight loss likely due to subacute endocarditis but worry about other pathology he's never had a colonoscopy and certainly viridans group strep can be associated with malignancy of the GI tract as well. I think he should have a colonoscopy at some point in the near future.   LOS: 4 days   Alcide Evener 07/10/2014, 4:03 PM

## 2014-07-11 ENCOUNTER — Inpatient Hospital Stay (HOSPITAL_COMMUNITY): Payer: Commercial Managed Care - HMO

## 2014-07-11 ENCOUNTER — Encounter (HOSPITAL_COMMUNITY)
Admission: AD | Disposition: A | Discharge status: Home Health | Payer: Self-pay | Source: Other Acute Inpatient Hospital | Attending: Internal Medicine

## 2014-07-11 ENCOUNTER — Encounter (HOSPITAL_COMMUNITY): Payer: Self-pay | Admitting: *Deleted

## 2014-07-11 DIAGNOSIS — I339 Acute and subacute endocarditis, unspecified: Secondary | ICD-10-CM

## 2014-07-11 DIAGNOSIS — I340 Nonrheumatic mitral (valve) insufficiency: Secondary | ICD-10-CM

## 2014-07-11 DIAGNOSIS — I351 Nonrheumatic aortic (valve) insufficiency: Secondary | ICD-10-CM | POA: Diagnosis present

## 2014-07-11 DIAGNOSIS — I330 Acute and subacute infective endocarditis: Secondary | ICD-10-CM

## 2014-07-11 DIAGNOSIS — M25569 Pain in unspecified knee: Secondary | ICD-10-CM | POA: Diagnosis present

## 2014-07-11 HISTORY — PX: TEE WITHOUT CARDIOVERSION: SHX5443

## 2014-07-11 LAB — CBC
HCT: 28.6 % — ABNORMAL LOW (ref 39.0–52.0)
Hemoglobin: 8.9 g/dL — ABNORMAL LOW (ref 13.0–17.0)
MCH: 25.2 pg — AB (ref 26.0–34.0)
MCHC: 31.1 g/dL (ref 30.0–36.0)
MCV: 81 fL (ref 78.0–100.0)
Platelets: 282 10*3/uL (ref 150–400)
RBC: 3.53 MIL/uL — ABNORMAL LOW (ref 4.22–5.81)
RDW: 18.2 % — ABNORMAL HIGH (ref 11.5–15.5)
WBC: 8.1 10*3/uL (ref 4.0–10.5)

## 2014-07-11 LAB — BASIC METABOLIC PANEL
ANION GAP: 3 — AB (ref 5–15)
BUN: 15 mg/dL (ref 6–23)
CALCIUM: 8 mg/dL — AB (ref 8.4–10.5)
CO2: 25 mmol/L (ref 19–32)
Chloride: 111 mmol/L (ref 96–112)
Creatinine, Ser: 1.35 mg/dL (ref 0.50–1.35)
GFR calc Af Amer: 58 mL/min — ABNORMAL LOW (ref >90)
GFR, EST NON AFRICAN AMERICAN: 50 mL/min — AB (ref >90)
Glucose, Bld: 91 mg/dL (ref 70–99)
Potassium: 4.4 mmol/L (ref 3.5–5.1)
Sodium: 139 mmol/L (ref 135–145)

## 2014-07-11 LAB — OCCULT BLOOD X 1 CARD TO LAB, STOOL: FECAL OCCULT BLD: POSITIVE — AB

## 2014-07-11 LAB — PREPARE RBC (CROSSMATCH)

## 2014-07-11 LAB — HEPARIN LEVEL (UNFRACTIONATED): Heparin Unfractionated: 0.21 IU/mL — ABNORMAL LOW (ref 0.30–0.70)

## 2014-07-11 LAB — CULTURE, BLOOD (ROUTINE X 2)

## 2014-07-11 LAB — ABO/RH: ABO/RH(D): A POS

## 2014-07-11 LAB — CULTURE, BLOOD (SINGLE)

## 2014-07-11 SURGERY — ECHOCARDIOGRAM, TRANSESOPHAGEAL
Anesthesia: Moderate Sedation

## 2014-07-11 MED ORDER — PANTOPRAZOLE SODIUM 40 MG IV SOLR
8.0000 mg/h | INTRAVENOUS | Status: AC
  Administered 2014-07-11 – 2014-07-13 (×5): 8 mg/h via INTRAVENOUS
  Filled 2014-07-11 (×12): qty 80

## 2014-07-11 MED ORDER — SODIUM CHLORIDE 0.9 % IV SOLN
INTRAVENOUS | Status: DC
  Administered 2014-07-11: 500 mL via INTRAVENOUS

## 2014-07-11 MED ORDER — DIPHENHYDRAMINE HCL 50 MG/ML IJ SOLN
INTRAMUSCULAR | Status: AC
  Filled 2014-07-11: qty 1

## 2014-07-11 MED ORDER — MIDAZOLAM HCL 10 MG/2ML IJ SOLN
INTRAMUSCULAR | Status: DC | PRN
  Administered 2014-07-11 (×2): 1 mg via INTRAVENOUS
  Administered 2014-07-11: 2 mg via INTRAVENOUS

## 2014-07-11 MED ORDER — FENTANYL CITRATE 0.05 MG/ML IJ SOLN
INTRAMUSCULAR | Status: AC
  Filled 2014-07-11: qty 2

## 2014-07-11 MED ORDER — MIDAZOLAM HCL 5 MG/ML IJ SOLN
INTRAMUSCULAR | Status: AC
  Filled 2014-07-11: qty 2

## 2014-07-11 MED ORDER — BUTAMBEN-TETRACAINE-BENZOCAINE 2-2-14 % EX AERO
INHALATION_SPRAY | CUTANEOUS | Status: DC | PRN
  Administered 2014-07-11: 2 via TOPICAL

## 2014-07-11 MED ORDER — SODIUM CHLORIDE 0.9 % IV SOLN
80.0000 mg | Freq: Once | INTRAVENOUS | Status: AC
  Administered 2014-07-11: 80 mg via INTRAVENOUS
  Filled 2014-07-11: qty 80

## 2014-07-11 MED ORDER — FENTANYL CITRATE 0.05 MG/ML IJ SOLN
INTRAMUSCULAR | Status: DC | PRN
  Administered 2014-07-11 (×3): 25 ug via INTRAVENOUS

## 2014-07-11 MED ORDER — PANTOPRAZOLE SODIUM 40 MG IV SOLR
40.0000 mg | Freq: Two times a day (BID) | INTRAVENOUS | Status: DC
  Administered 2014-07-14 – 2014-07-15 (×2): 40 mg via INTRAVENOUS
  Filled 2014-07-11 (×2): qty 40

## 2014-07-11 MED ORDER — SODIUM CHLORIDE 0.9 % IV SOLN
Freq: Once | INTRAVENOUS | Status: AC
  Administered 2014-07-11: 13:00:00 via INTRAVENOUS

## 2014-07-11 NOTE — Progress Notes (Signed)
  Echocardiogram Echocardiogram Transesophageal has been performed.  Johny Chess 07/11/2014, 11:06 AM

## 2014-07-11 NOTE — Progress Notes (Signed)
[facility] for heparin Indication: afib  Allergies  Allergen Reactions  . Penicillins     Unable to say what kind of reaction    Patient Measurements: Height: 6' (182.9 cm) Weight: 187 lb (84.823 kg) IBW/kg (Calculated) : 77.6 Heparin Dosing Weight: 82.6kg  Vital Signs: Temp: 98.1 F (36.7 C) (04/04 2203) Temp Source: Oral (04/04 2203) BP: 140/36 mmHg (04/04 2203) Pulse Rate: 67 (04/04 2203)  Labs:  Recent Labs  07/08/14 0333 07/09/14 0458 07/10/14 0530 07/10/14 2359  HGB 8.6* 9.0* 8.8*  --   HCT 27.7* 29.0* 28.7*  --   PLT 280 306 303  --   APTT 80* 63* 102*  --   HEPARINUNFRC 0.25* 0.25* 0.33 0.21*  CREATININE 1.25 1.24 1.28  --     Estimated Creatinine Clearance: 55.6 mL/min (by C-G formula based on Cr of 1.28).  Assessment: 74 yo male with recent NSTEMI, h/o Afib Xarelto on hold, for heparin  Goal of Therapy:  Heparin level 0.3-0.7 units/ml Monitor platelets by anticoagulation protocol: Yes   Plan: Increase Heparin 1550 units/hr Check heparin level in 8 hours.   Phillis Knack, PharmD, BCPS   07/11/2014 12:47 AM

## 2014-07-11 NOTE — Progress Notes (Signed)
Pt noted to have loose BM this am with bright red blood in it. Hemocult card sent  Per order, results were positive. Hgb noted to be 8.8. Results called to Dr. Allyson Sabal. Order given to stop IV heparin, will carry out order. Will continue to monitor patient closely.

## 2014-07-11 NOTE — Progress Notes (Signed)
[facility]    Subjective: No new complaints   Antibiotics:  Anti-infectives    Start     Dose/Rate Route Frequency Ordered Stop   07/10/14 1200  levofloxacin (LEVAQUIN) IVPB 750 mg     750 mg 100 mL/hr over 90 Minutes Intravenous Every 24 hours 07/10/14 0708 07/10/14 1314   07/08/14 1200  levofloxacin (LEVAQUIN) IVPB 750 mg  Status:  Discontinued     750 mg 100 mL/hr over 90 Minutes Intravenous Every 24 hours 07/08/14 1124 07/10/14 0708   07/07/14 1000  levofloxacin (LEVAQUIN) tablet 750 mg  Status:  Discontinued     750 mg Oral Daily 07/06/14 1656 07/06/14 1734   07/07/14 1000  levofloxacin (LEVAQUIN) tablet 750 mg  Status:  Discontinued     750 mg Oral Every 48 hours 07/06/14 1734 07/07/14 0954   07/07/14 1000  levofloxacin (LEVAQUIN) tablet 750 mg  Status:  Discontinued     750 mg Oral Daily 07/07/14 0954 07/08/14 1124   07/06/14 2200  vancomycin (VANCOCIN) IVPB 750 mg/150 ml premix     750 mg 150 mL/hr over 60 Minutes Intravenous Every 12 hours 07/06/14 1734        Medications: Scheduled Meds: . amiodarone  200 mg Oral Daily  . atorvastatin  40 mg Oral QHS  . digoxin  125 mcg Oral Daily  . feeding supplement (ENSURE ENLIVE)  237 mL Oral BID BM  . metoprolol tartrate  25 mg Oral BID  . [START ON 07/15/2014] pantoprazole (PROTONIX) IV  40 mg Intravenous Q12H  . saccharomyces boulardii  250 mg Oral BID  . sodium chloride  3 mL Intravenous Q12H  . vancomycin  750 mg Intravenous Q12H   Continuous Infusions: . pantoprozole (PROTONIX) infusion 8 mg/hr (07/11/14 1300)   PRN Meds:.sodium chloride, acetaminophen, calcium carbonate, nitroGLYCERIN, ondansetron (ZOFRAN) IV, sodium chloride    Objective: Weight change: -5 lb (-2.268 kg)  Intake/Output Summary (Last 24 hours) at 07/11/14 1504 Last data filed at 07/11/14 0925  Gross per 24 hour  Intake    240 ml  Output    500 ml  Net   -260 ml   Blood pressure 129/36, pulse 58, temperature  98.8 F (37.1 C), temperature source Oral, resp. rate 19, height 6' (1.829 m), weight 182 lb (82.555 kg), SpO2 100 %. Temp:  [97.8 F (36.6 C)-98.8 F (37.1 C)] 98.8 F (37.1 C) (04/05 1451) Pulse Rate:  [58-85] 58 (04/05 1451) Resp:  [15-27] 19 (04/05 1451) BP: (122-177)/(25-60) 129/36 mmHg (04/05 1451) SpO2:  [91 %-100 %] 100 % (04/05 1451) Weight:  [182 lb (82.555 kg)] 182 lb (82.555 kg) (04/05 0457)  Physical Exam: General: Alert and awake, oriented x3, not in any acute distress. HEENT: anicteric sclera, sclera clear EOMI he has very poor dentition CVS irr regular rate, normal r,  no murmur rubs or gallops Chest: clear to auscultation bilaterally, no wheezing, rales or rhonchi Abdomen: soft nontender, nondistended, normal bowel sounds, Knee is tender swollen Skin: no rashes, no oslers, Janeway's  Neuro: nonfocal  CBC: CBC    Component Value Date/Time   WBC 8.1 07/11/2014 1143   RBC 3.53* 07/11/2014 1143   HGB 8.9* 07/11/2014 1143   HCT 28.6* 07/11/2014 1143   PLT 282 07/11/2014 1143   MCV 81.0 07/11/2014 1143   MCH 25.2* 07/11/2014 1143   MCHC 31.1 07/11/2014 1143   RDW 18.2* 07/11/2014 1143      BMET  Recent Labs  07/10/14 0530  07/11/14 1143  NA 138 139  K 4.4 4.4  CL 109 111  CO2 22 25  GLUCOSE 91 91  BUN 13 15  CREATININE 1.28 1.35  CALCIUM 8.2* 8.0*     Liver Panel   Recent Labs  07/09/14 0458 07/10/14 0530  PROT 5.9* 5.6*  ALBUMIN 2.1* 2.0*  AST 35 33  ALT 26 24  ALKPHOS 72 69  BILITOT 0.5 0.6       Sedimentation Rate No results for input(s): ESRSEDRATE in the last 72 hours. C-Reactive Protein No results for input(s): CRP in the last 72 hours.  Micro Results: Recent Results (from the past 720 hour(s))  Culture, blood (routine x 2)     Status: None   Collection Time: 07/06/14  7:40 PM  Result Value Ref Range Status   Specimen Description BLOOD LEFT ANTECUBITAL  Final   Special Requests BOTTLES DRAWN AEROBIC AND ANAEROBIC  3CC EA  Final   Culture   Final    VIRIDANS STREPTOCOCCUS Note: Gram Stain Report Called to,Read Back By and Verified With: DEVON RN ON 3W AT 1412 [id number] BY CASTC Performed at [facility]    Report Status 07/11/2014 FINAL  Final  Culture, blood (routine x 2)     Status: None (Preliminary result)   Collection Time: 07/06/14  7:50 PM  Result Value Ref Range Status   Specimen Description BLOOD LEFT HAND  Final   Special Requests BOTTLES DRAWN AEROBIC ONLY 2CC  Final   Culture   Final           BLOOD CULTURE RECEIVED NO GROWTH TO DATE CULTURE WILL BE HELD FOR 5 DAYS BEFORE ISSUING A FINAL NEGATIVE REPORT Performed at [facility]    Report Status PENDING  Incomplete  Culture, blood (routine x 2)     Status: None (Preliminary result)   Collection Time: 07/07/14 10:46 AM  Result Value Ref Range Status   Specimen Description BLOOD LEFT ARM  Final   Special Requests BOTTLES DRAWN AEROBIC AND ANAEROBIC 10CC EACH  Final   Culture   Final           BLOOD CULTURE RECEIVED NO GROWTH TO DATE CULTURE WILL BE HELD FOR 5 DAYS BEFORE ISSUING A FINAL NEGATIVE REPORT Performed at [facility]    Report Status PENDING  Incomplete  Culture, blood (routine x 2)     Status: None (Preliminary result)   Collection Time: 07/07/14 10:53 AM  Result Value Ref Range Status   Specimen Description BLOOD LEFT HAND  Final   Special Requests BOTTLES DRAWN AEROBIC AND ANAEROBIC 10CC EACH  Final   Culture   Final           BLOOD CULTURE RECEIVED NO GROWTH TO DATE CULTURE WILL BE HELD FOR 5 DAYS BEFORE ISSUING A FINAL NEGATIVE REPORT Performed at [facility]    Report Status PENDING  Incomplete  Clostridium Difficile by PCR     Status: None   Collection Time: 07/09/14 12:42 AM  Result Value Ref Range Status   C difficile by pcr NEGATIVE NEGATIVE Final  Body fluid culture     Status: None (Preliminary result)   Collection Time: 07/10/14  2:00 PM  Result Value Ref Range Status     Specimen Description FLUID KNEE RIGHT SYNOVIAL  Final   Special Requests NONE  Final   Gram Stain   Final    RARE WBC PRESENT,BOTH PMN AND MONONUCLEAR NO ORGANISMS SEEN Performed at [facility]    Culture NO GROWTH Performed at [facility]   Final   Report Status PENDING  Incomplete    Studies/Results: Dg Orthopantogram  07/10/2014   CLINICAL DATA:  Bacteremia. Infection in the mouth. Symptoms for 2 months.  EXAM: ORTHOPANTOGRAM/PANORAMIC  COMPARISON:  None.  FINDINGS: Positioning is nonstandard. No evidence for acute fracture of the mandible. Majority of the maxillary teeth are absent. There is limited detail of the maxillary alveolar ridge, especially on the right side. Suspect caries of the left mandibular lateral incisor possibly associated with periapical abscess. There is limited detail regarding the central mandibular teeth.  IMPRESSION: 1. Limited detail. 2. Numerous absent teeth. 3. Suspect significant caries and possible periapical abscess of the left lateral mandibular incisor.   Electronically Signed   By: Nolon Nations M.D.   On: 07/10/2014 21:44   Dg Fluoro Guide Ndl Plcd/bx/inj/loc  07/10/2014   CLINICAL DATA:  Right knee swelling  EXAM: RIGHT KNEE ASPIRATION UNDER FLUOROSCOPY  FLUOROSCOPY TIME:  Radiation Exposure Index (as provided by the fluoroscopic device): 2.3 microGy*m^2  PROCEDURE: PROCEDURE  I discussed the risks (including hemorrhage, infection, and allergic reaction, among others), benefits, and alternatives to the procedure with the patient. We specifically discussed the likelihood of success of the procedure. The patient understood and elected to undergo the procedure.  Standard time-out was employed. The suprapatellar bursal region was wrapped with a compression bandage in order to force fluid down into the joint. Following sterile skin prep and local anesthetic administration consisting of 1% lidocaine, an 18 gauge needle was advanced into the  knee joint from a medial approach below the patella. I obtained in image to show positioning. A total of 2 cc of sanguinous fluid was withdrawn. Despite repositioning so I was unable to get more fluid from the joint. The needle was subsequently removed and the skin cleansed and bandaged. No immediate complications were observed.  IMPRESSION: 1. Right knee arthrocentesis yielded 2 cc of sanguinous fluid. Despite repositioning side I was unable to get or joint fluid.   Electronically Signed   By: Van Clines M.D.   On: 07/10/2014 14:09      Assessment/Plan:  Principal Problem:   Viridans streptococci infection Active Problems:   NSTEMI (non-ST elevated myocardial infarction)   Bacteremia   Atrial fibrillation   Chronic systolic CHF (congestive heart failure)   Right lower lobe pneumonia   Protein-calorie malnutrition, severe   Unintentional weight loss   Acute pain of right knee   Normocytic anemia   Hypertension   Coronary artery disease   Dyslipidemia   Acute and subacute infective endocarditis in diseases classified elsewhere    Joe Kennedy is a 74 y.o. male with  Poor dentition who in fact has as recently as a year ago pulled his out teeth out when they "went bad" who resents with an approximately 4 month history of poor appetite and anorexia and 35 pound weight loss. This had developed after he developed knee pain in his right knee which had failed to improve. Ultimately he had a syncopal episode in his doctor's office and was brought down [place]. Blood cultures were done there and have shown him to have viridans group streptococci in 2 out of 2 blood cultures. The penicillin MIC is 0.125. His TEE shows shaggy vegatations on Aortic Valve with severe AR. His panorex shows possible periapical abscess of the left lateral mandibular incisor.  He has a childhood history and young  adult history of severe penicillin allergy with his face and throat swelling  up. He does not recall being treated with penicillin or cephalosporin since then.  #1 Viridans group Streptoccal bacterermia and endocarditis:  --Continue vancomycin given severe penicillin allergy  --CT SURGERY CONSULTATION  --DENTAL CONSULT RE HIS LEFT LATERAL MANDIBULAR INCISOR POSSIBLE ABSCESS  --he will NEED  4 weeks of IV vancomycin   #2 knee pain: Radiology was not able to obtain fluid from the knee. Consider MRI of the knee if pain still persists and we worry still about potential nidus of infection there.  #3 35 pound weight loss likely due to subacute endocarditis but worry about other pathology   I am not urgently pushing for colonoscopy but would consider as something else that could have caused his 35 weight loss though it may also be his subacute endocarditis   LOS: 5 days   Alcide Evener 07/11/2014, 3:04 PM

## 2014-07-11 NOTE — CV Procedure (Signed)
TEE:  5 mg versed 50 ug fentanyl   Mild LVE EF 60% Trileaflet Aortic valve with severe AR.  Appears to be "shaggy" with small vegetations on leaflets that are thickened Mild MR  Severe TR elevated PA pressure PA 74 mmHg   Normal PV No effusion No LAA thrombus LAE No aortic debris  Recommend Rx SBE and either f/u TEE or consideration for AVR given severe AR.  Patient should have propofol for future TEE;s as probe was Very difficult to place  [employer]

## 2014-07-11 NOTE — Progress Notes (Addendum)
[facility] PROGRESS NOTE   Joe Kennedy LZJ:[id number] DOB: 11/17/1940 DOA: 07/06/2014 PCP: No primary care provider on file.  HPI/Subjective: Crampy abdominal pain, loose stool  Assessment/Plan: Principal Problem:   Viridans streptococci infection Active Problems:   NSTEMI (non-ST elevated myocardial infarction)   Bacteremia   Atrial fibrillation   Chronic systolic CHF (congestive heart failure)   Right lower lobe pneumonia   Protein-calorie malnutrition, severe   Unintentional weight loss   Acute pain of right knee   Normocytic anemia   Hypertension   Coronary artery disease   Dyslipidemia   Acute and subacute infective endocarditis in diseases classified elsewhere   HISTORY OF PRESENT ILLNESS 74 y.o. male started to feel bad in January. He recalls being woken about 4 AM one morning with sudden onset of severe right knee pain. He saw his primary care physician and was told that he had bone-on-bone arthritis. He had a steroid injection but noted no improvement. His pain got so bad that he can only get around with crutches. Around that same time he developed anorexia and has lost 35 pounds. He also started having chills and sweats.  More recently he developed some chest and arm pain and was seen in the emergency department at [facility]. He was sent home and return to see his primary care physician later that day. He had a syncopal episode with hypotension in his doctor's office and was taken back to [facility] and admitted. Tube 2 blood cultures done on admission air are growing an antibiotic sensitive viridans strep. Chest x-ray revealed bibasilar infiltrates. He was started on levofloxacin and vancomycin before transfer here 2 days ago.  Hospital course has been complicated by a positive TEE a small vegetation on the aortic valve with severe aortic valve regurgitation. Subsequently patient developed bright red blood per  rectum. Patient was receiving IV heparin for non-ST elevation MI. Heparin drip discontinued. Patient was transfused 1 unit of packed red blood cells. Dr. Carol Ada was consulted for possible colonoscopy. Infectious disease is following the patient for strep bacteremia and they recommend colonoscopy given patient's weight loss. Patient also had an attempted right arthrocentesis of the right knee because of chronic right knee pain to see the patient has a septic joint. However interventional radiology failed to obtain fluid therefore infectious disease recommended an MRI of the right knee which is ordered and still pending at this time.  Detailed Hospital course  Non-STEMI in the setting of sepsis Patient presented to [facility] with syncope. Troponin went up to 11, admitted under cardiology initially. -No CP overnight -Troponin levels: 4.03, 3.46, 3.11 -2D echo  Shows an EF of 55-60% , grade 2 diastolic dysfunction, -EKG with T wave inversion in Leads II and lateral leads, documented in [facility] H&P - cath at [facility] within last 2 weeks, reviewed by Drs Angelena Form and Nahser, their evaluation: The LAD has moderate disease but is not seen very well ijn all views. The LCx has a tight stenosis but is not a good target for CABG - it primarily fills a small , subtotalled OM ( that already has collateral flow). The RCA has distal disease and could potentially be stented  Continue as per cardiology, statin and beta blocker There are no plans for cardiac catheterization Heparin drip discontinued today because of bloody diarrhea We will also DC aspirin  Bloody diarrhea We'll start the patient on a Protonix drip, discontinue aspirin, heparin Transfuse 1 unit of packed red  blood cells Stat CBC Dr. Carol Ada contacted for colonoscopy pending cardiology clearance C. Difficile negative during this admission    Chronic systolic CHF (congestive heart failure) -Last known EF 35% by LV gram  on last heart cath,Moderate to severe AI on echo yesterday with moderate to severe AI. Moderate to severe TR TEE shows EF of 60%, severe aortic regurgitation, severe tricuspid regurgitation, small vegetation on the aortic valve -Continue BB -Continue to hold ACE d/t hypotension and recent AKI, renal function stable   Streptococcus viridans Bacteremia  According to results from [facility] gram-positive cocci bacteremia in 2 bottles. This is likely secondary to pneumonia, patient already on vancomycin/Levaquin. Repeat blood culture 3/31, 4/1 no growth so far Infectious disease Dr. Megan Salon consulted ,   TEE 4/5 Arthrocentesis right knee attempted by interventional radiology but not successful MRI of the knee ordered and pending Because of concerns about SBE, cardiology recommends 6 weeks of antibiotics , repeat TEE , possible surgery given severe aortic regurgitation   Diarrhea Improving,C. Difficile negative Switched levofloxacin to IV as patient unable to tolerate by mouth Continue florastor   Right lower lobe pneumonia X-rays from [facility] yesterday showed probable RLL. Continue antibiotics  Atrial fibrillation Patient was on Eliquis as outpatient, currently on heparin drip for the non-STEMI. Rate is controlled,  Probably DC heparin and switch to Eliquis  Pending clearance from cardiology and GI CHA2DS2-VASc score of 3  Hypokalemia Repleted   Code Status: Full Code Family Communication: Plan discussed with the patient. Disposition Plan: PT/OT eval, GI consultation, cardiology, infectious disease following  Diet:    Consultants:  Cardiology  Infectious disease  Gastroenterology  Procedures:  None  Antibiotics:  Vancomycin   Objective:patient developed bloody diarrhea   Filed Vitals:   07/11/14 1110  BP:   Pulse: 59  Temp:   Resp: 18    Intake/Output Summary (Last 24 hours) at 07/11/14 1148 Last data filed at 07/11/14 0925  Gross  per 24 hour  Intake    240 ml  Output    625 ml  Net   -385 ml   Filed Weights   07/09/14 0400 07/10/14 0400 07/11/14 0457  Weight: 86.637 kg (191 lb) 84.823 kg (187 lb) 82.555 kg (182 lb)    Exam: General: Well developed, well nourished, male in no acute distress. Head: Normocephalic, atraumatic. Neck: Supple without bruits, no JVD. Lungs: Resp regular and unlabored, CTA. Mild rales at bases. Heart: RRR, S1, S2, no S3, S4, 3/6 diastolic murmur; no rub. Abdomen: Soft, non-tender, non-distended, BS + x 4.  Extremities: No clubbing, cyanosis, no edema.  Neuro: a little sleepy, but awake and oriented X 3. Moves all extremities spontaneously. Psych: Normal affect.  Data Reviewed: Basic Metabolic Panel:  Recent Labs Lab 07/06/14 1940 07/07/14 0519 07/08/14 0333 07/09/14 0458 07/10/14 0530  NA  --  139 138 139 138  K  --  3.2* 3.4* 4.3 4.4  CL  --  110 110 112 109  CO2  --  25 23 20 22   GLUCOSE  --  81 88 88 91  BUN  --  20 22 17 13   CREATININE  --  1.31 1.25 1.24 1.28  CALCIUM  --  8.1* 8.0* 8.3* 8.2*  MG 2.1  --   --   --   --    Liver Function Tests:  Recent Labs Lab 07/07/14 0519 07/08/14 0333 07/09/14 0458 07/10/14 0530  AST 31 37 35 33  ALT 21 25 26  24  ALKPHOS 64 66 72 69  BILITOT 0.6 0.8 0.5 0.6  PROT 5.3* 5.4* 5.9* 5.6*  ALBUMIN 1.9* 1.9* 2.1* 2.0*   No results for input(s): LIPASE, AMYLASE in the last 168 hours. No results for input(s): AMMONIA in the last 168 hours. CBC:  Recent Labs Lab 07/07/14 0519 07/08/14 0333 07/09/14 0458 07/10/14 0530  WBC 9.1 7.9 7.9 7.7  HGB 8.6* 8.6* 9.0* 8.8*  HCT 27.9* 27.7* 29.0* 28.7*  MCV 78.8 78.2 78.4 79.5  PLT 295 280 306 303   Cardiac Enzymes:  Recent Labs Lab 07/06/14 1940 07/07/14 0005 07/07/14 0519  TROPONINI 4.03* 3.46* 3.11*   BNP (last 3 results)  Recent Labs  07/06/14 1940  BNP 779.9*    ProBNP (last 3 results) No results for input(s): PROBNP in the last 8760  hours.  CBG: No results for input(s): GLUCAP in the last 168 hours.  Micro Recent Results (from the past 240 hour(s))  Culture, blood (routine x 2)     Status: None   Collection Time: 07/06/14  7:40 PM  Result Value Ref Range Status   Specimen Description BLOOD LEFT ANTECUBITAL  Final   Special Requests BOTTLES DRAWN AEROBIC AND ANAEROBIC 3CC EA  Final   Culture   Final    VIRIDANS STREPTOCOCCUS Note: Gram Stain Report Called to,Read Back By and Verified With: DEVON RN ON 3W AT [id number] BY CASTC Performed at [facility]    Report Status 07/11/2014 FINAL  Final  Culture, blood (routine x 2)     Status: None (Preliminary result)   Collection Time: 07/06/14  7:50 PM  Result Value Ref Range Status   Specimen Description BLOOD LEFT HAND  Final   Special Requests BOTTLES DRAWN AEROBIC ONLY 2CC  Final   Culture   Final           BLOOD CULTURE RECEIVED NO GROWTH TO DATE CULTURE WILL BE HELD FOR 5 DAYS BEFORE ISSUING A FINAL NEGATIVE REPORT Performed at [facility]    Report Status PENDING  Incomplete  Culture, blood (routine x 2)     Status: None (Preliminary result)   Collection Time: 07/07/14 10:46 AM  Result Value Ref Range Status   Specimen Description BLOOD LEFT ARM  Final   Special Requests BOTTLES DRAWN AEROBIC AND ANAEROBIC 10CC EACH  Final   Culture   Final           BLOOD CULTURE RECEIVED NO GROWTH TO DATE CULTURE WILL BE HELD FOR 5 DAYS BEFORE ISSUING A FINAL NEGATIVE REPORT Performed at [facility]    Report Status PENDING  Incomplete  Culture, blood (routine x 2)     Status: None (Preliminary result)   Collection Time: 07/07/14 10:53 AM  Result Value Ref Range Status   Specimen Description BLOOD LEFT HAND  Final   Special Requests BOTTLES DRAWN AEROBIC AND ANAEROBIC 10CC EACH  Final   Culture   Final           BLOOD CULTURE RECEIVED NO GROWTH TO DATE CULTURE WILL BE HELD FOR 5 DAYS BEFORE ISSUING A FINAL NEGATIVE REPORT Performed at  [facility]    Report Status PENDING  Incomplete  Clostridium Difficile by PCR     Status: None   Collection Time: 07/09/14 12:42 AM  Result Value Ref Range Status   C difficile by pcr NEGATIVE NEGATIVE Final  Body fluid culture     Status: None (Preliminary result)   Collection Time: 07/10/14  2:00 PM  Result Value Ref Range Status   Specimen Description FLUID KNEE RIGHT SYNOVIAL  Final   Special Requests NONE  Final   Gram Stain   Final    RARE WBC PRESENT,BOTH PMN AND MONONUCLEAR NO ORGANISMS SEEN Performed at [facility]    Culture NO GROWTH Performed at [facility]   Final   Report Status PENDING  Incomplete     Studies: Dg Orthopantogram  07/10/2014   CLINICAL DATA:  Bacteremia. Infection in the mouth. Symptoms for 2 months.  EXAM: ORTHOPANTOGRAM/PANORAMIC  COMPARISON:  None.  FINDINGS: Positioning is nonstandard. No evidence for acute fracture of the mandible. Majority of the maxillary teeth are absent. There is limited detail of the maxillary alveolar ridge, especially on the right side. Suspect caries of the left mandibular lateral incisor possibly associated with periapical abscess. There is limited detail regarding the central mandibular teeth.  IMPRESSION: 1. Limited detail. 2. Numerous absent teeth. 3. Suspect significant caries and possible periapical abscess of the left lateral mandibular incisor.   Electronically Signed   By: Nolon Nations M.D.   On: 07/10/2014 21:44   Dg Fluoro Guide Ndl Plcd/bx/inj/loc  07/10/2014   CLINICAL DATA:  Right knee swelling  EXAM: RIGHT KNEE ASPIRATION UNDER FLUOROSCOPY  FLUOROSCOPY TIME:  Radiation Exposure Index (as provided by the fluoroscopic device): 2.3 microGy*m^2  PROCEDURE: PROCEDURE  I discussed the risks (including hemorrhage, infection, and allergic reaction, among others), benefits, and alternatives to the procedure with the patient. We specifically discussed the likelihood of success of the procedure.  The patient understood and elected to undergo the procedure.  Standard time-out was employed. The suprapatellar bursal region was wrapped with a compression bandage in order to force fluid down into the joint. Following sterile skin prep and local anesthetic administration consisting of 1% lidocaine, an 18 gauge needle was advanced into the knee joint from a medial approach below the patella. I obtained in image to show positioning. A total of 2 cc of sanguinous fluid was withdrawn. Despite repositioning so I was unable to get more fluid from the joint. The needle was subsequently removed and the skin cleansed and bandaged. No immediate complications were observed.  IMPRESSION: 1. Right knee arthrocentesis yielded 2 cc of sanguinous fluid. Despite repositioning side I was unable to get or joint fluid.   Electronically Signed   By: Van Clines M.D.   On: 07/10/2014 14:09    Scheduled Meds: . amiodarone  200 mg Oral Daily  . aspirin EC  81 mg Oral Daily  . atorvastatin  40 mg Oral QHS  . digoxin  125 mcg Oral Daily  . feeding supplement (ENSURE ENLIVE)  237 mL Oral BID BM  . metoprolol tartrate  25 mg Oral BID  . saccharomyces boulardii  250 mg Oral BID  . sodium chloride  3 mL Intravenous Q12H  . vancomycin  750 mg Intravenous Q12H   Continuous Infusions:       Time spent: 35 minutes    [facility]  [facility] Pager [phone] If 7PM-7AM, please contact night-coverage at [email or web address], password [facility] 07/11/2014, 11:48 AM  LOS: 5 days

## 2014-07-11 NOTE — Progress Notes (Signed)
Subjective: No complaints except weakness getting up  Objective: Vital signs in last 24 hours: Temp:  [97.3 F (36.3 C)-98.5 F (36.9 C)] 98.5 F (36.9 C) (04/05 0457) Pulse Rate:  [64-72] 72 (04/05 0457) Resp:  [18] 18 (04/04 2203) BP: (134-140)/(36-44) 139/44 mmHg (04/05 0457) SpO2:  [95 %-99 %] 95 % (04/05 0457) Weight:  [182 lb (82.555 kg)] 182 lb (82.555 kg) (04/05 0457) Weight change: -5 lb (-2.268 kg) Last BM Date: 07/11/14 Intake/Output from previous day: -560 04/04 0701 - 04/05 0700 In: 240 [P.O.:240] Out: 650 [Urine:650] Intake/Output this shift:    PE: General:Pleasant affect, NAD Skin:Warm and dry, brisk capillary refill, pale HEENT:normocephalic, sclera clear, mucus membranes moist Heart:S1S2 RRR with soft systolic murmur, no gallup, rub or click Lungs:clear without rales, rhonchi, or wheezes IPJ:ASNK, non tender, + BS, do not palpate liver spleen or masses Ext:no lower ext edema, 2+ pedal pulses, 2+ radial pulses Neuro:alert and oriented X 3, MAE, follows commands, + facial symmetry Tele:  SR  Lab Results:  Recent Labs  07/09/14 0458 07/10/14 0530  WBC 7.9 7.7  HGB 9.0* 8.8*  HCT 29.0* 28.7*  PLT 306 303   BMET  Recent Labs  07/09/14 0458 07/10/14 0530  NA 139 138  K 4.3 4.4  CL 112 109  CO2 20 22  GLUCOSE 88 91  BUN 17 13  CREATININE 1.24 1.28  CALCIUM 8.3* 8.2*   No results for input(s): TROPONINI in the last 72 hours.  Invalid input(s): CK, MB  Lab Results  Component Value Date   CHOL 82 07/07/2014   HDL 18* 07/07/2014   LDLCALC 43 07/07/2014   TRIG 103 07/07/2014   CHOLHDL 4.6 07/07/2014   Lab Results  Component Value Date   HGBA1C 5.9* 07/06/2014     Lab Results  Component Value Date   TSH 2.343 07/06/2014    Hepatic Function Panel  Recent Labs  07/10/14 0530  PROT 5.6*  ALBUMIN 2.0*  AST 33  ALT 24  ALKPHOS 69  BILITOT 0.6   No results for input(s): CHOL in the last 72 hours. No results for  input(s): PROTIME in the last 72 hours.     Studies/Results: Dg Orthopantogram  07/10/2014   CLINICAL DATA:  Bacteremia. Infection in the mouth. Symptoms for 2 months.  EXAM: ORTHOPANTOGRAM/PANORAMIC  COMPARISON:  None.  FINDINGS: Positioning is nonstandard. No evidence for acute fracture of the mandible. Majority of the maxillary teeth are absent. There is limited detail of the maxillary alveolar ridge, especially on the right side. Suspect caries of the left mandibular lateral incisor possibly associated with periapical abscess. There is limited detail regarding the central mandibular teeth.  IMPRESSION: 1. Limited detail. 2. Numerous absent teeth. 3. Suspect significant caries and possible periapical abscess of the left lateral mandibular incisor.   Electronically Signed   By: Nolon Nations M.D.   On: 07/10/2014 21:44   Dg Fluoro Guide Ndl Plcd/bx/inj/loc  07/10/2014   CLINICAL DATA:  Right knee swelling  EXAM: RIGHT KNEE ASPIRATION UNDER FLUOROSCOPY  FLUOROSCOPY TIME:  Radiation Exposure Index (as provided by the fluoroscopic device): 2.3 microGy*m^2  PROCEDURE: PROCEDURE  I discussed the risks (including hemorrhage, infection, and allergic reaction, among others), benefits, and alternatives to the procedure with the patient. We specifically discussed the likelihood of success of the procedure. The patient understood and elected to undergo the procedure.  Standard time-out was employed. The suprapatellar bursal region was wrapped with a  compression bandage in order to force fluid down into the joint. Following sterile skin prep and local anesthetic administration consisting of 1% lidocaine, an 18 gauge needle was advanced into the knee joint from a medial approach below the patella. I obtained in image to show positioning. A total of 2 cc of sanguinous fluid was withdrawn. Despite repositioning so I was unable to get more fluid from the joint. The needle was subsequently removed and the skin cleansed  and bandaged. No immediate complications were observed.  IMPRESSION: 1. Right knee arthrocentesis yielded 2 cc of sanguinous fluid. Despite repositioning side I was unable to get or joint fluid.   Electronically Signed   By: Van Clines M.D.   On: 07/10/2014 14:09    Medications: I have reviewed the patient's current medications. Scheduled Meds: . amiodarone  200 mg Oral Daily  . aspirin EC  81 mg Oral Daily  . atorvastatin  40 mg Oral QHS  . digoxin  125 mcg Oral Daily  . feeding supplement (ENSURE ENLIVE)  237 mL Oral BID BM  . metoprolol tartrate  25 mg Oral BID  . saccharomyces boulardii  250 mg Oral BID  . sodium chloride  3 mL Intravenous Q12H  . vancomycin  750 mg Intravenous Q12H   Continuous Infusions: . heparin 1,550 Units/hr (07/11/14 0110)   PRN Meds:.sodium chloride, acetaminophen, calcium carbonate, nitroGLYCERIN, ondansetron (ZOFRAN) IV, sodium chloride  Assessment/Plan: NSTEMI (non-ST elevated myocardial infarction) Pk troponin prior to transfer 11- Dr. Clayborn Bigness recommended CABG medical management deep t wave inversions lat. On IV Heparin --reviewed by Drs Angelena Form and Nahser, their evaluation: The LAD has moderate disease but is not seen very well ijn all views. The LCx has a tight stenosis but is not a good target for CABG - it primarily fills a small , subtotalled OM ( that already has collateral flow). The RCA has distal disease and could potentially be stented  -on BB, no ACE due to AKI at [facility] now with Cr 1.28 yesterday   Bacteremia- ID following for TEE today    Atrial fibrillation- maintaining SR here was on eliquis and on amiodarone On IV heparin and dig. CHAD2S2VASc score 4- resume eliquis once TEE complete     Chronic systolic CHF (congestive heart failure) EF 35%-- now with recent Echo EF 50-55% with G2DD moderate to severe AI, mild MR mod to severe TR and PA pk pressure 69 mmHg    Right lower lobe pneumonia per IM, ID  Protein-calorie  malnutrition, severe per IM  Unintentional weight loss per IM  Acute pain of right knee- for aspiration today  Normocytic anemia- chronic H/H 8.8/28.7-per IM  Hypertension- BP 130/34 per IM  Coronary artery disease- see above   Dyslipidemia   LOS: 5 days   Time spent with pt. : 15 minutes. [facility]  Nurse Practitioner Certified Pager [phone] or after 5pm and on weekends call [phone] 07/11/2014, 9:09 AM

## 2014-07-11 NOTE — Interval H&P Note (Signed)
History and Physical Interval Note:  07/11/2014 9:27 AM  Joe Kennedy  has presented today for surgery, with the diagnosis of ENDOCARDITIS  The various methods of treatment have been discussed with the patient and family. After consideration of risks, benefits and other options for treatment, the patient has consented to  Procedure(s): TRANSESOPHAGEAL ECHOCARDIOGRAM (TEE) (N/A) as a surgical intervention .  The patient's history has been reviewed, patient examined, no change in status, stable for surgery.  I have reviewed the patient's chart and labs.  Questions were answered to the patient's satisfaction.     Jenkins Rouge

## 2014-07-11 NOTE — Progress Notes (Signed)
Responded to nurse request too assist patient with completing advance directives. Document was completed and notarized. A copy was given to patients' nurse for patients chart. The original and two copies were given to patient.   07/11/14 1300  Clinical Encounter Type  Visited With Patient and family together;Health care provider  Visit Type Initial;Spiritual support;Social support  Referral From Nurse  Spiritual Encounters  Spiritual Needs Literature;Emotional  Stress Factors  Patient Stress Factors None identified  Advance Directives (For Healthcare)  Does patient have an advance directive? Yes  Copy of advanced directive(s) in chart? Yes  Jaclynn Major, [place]

## 2014-07-11 NOTE — Consult Note (Signed)
   [facility] CM Inpatient Consult   07/11/2014  Joe Kennedy 05-Oct-1940 [id number] Patient evaluated for community based chronic disease management services with [place] [facility] as a benefit of patient's [employer] Insurance. Spoke with patient at bedside to explain [place] [facility] services. Explained to patient and wife about receiving post discharge care call and will be evaluated for monthly home visits for assessments and disease process education.  Left contact information and [facility] literature at bedside. Patient to follow up regarding the need for services. Made Inpatient Case Manager aware that [place] [facility] following. Please contact: Natividad Brood, RN BSN [facility] Liaison  [phone] business mobile phone

## 2014-07-11 NOTE — Consult Note (Signed)
Reason for Consult: Hematochezia on anticoagulation Referring Physician: [facility]  [name] HPI: This is a 74 year old male with a complex medical course.  The patient was transferred from [facility] for further management of his CAD and NSTEMI.  His cardiac catherization revealed two vessel disease that was not amenable to CABG at [facility] and he was recommended medical management, however, he presented to his PCP with a syncopal episode.  He was then transferred to [facility] again and he was noted to have respiratory failure requiring BiPAP, ARF, afib with RVR, another NSTEMI, and a right lower lobe pneumonia.  Blood cultures did reveal gram positive cocci that eventually was speciated to Strep viridans.  Further work up in [employer] with a TEE revealed that he has infective endocarditis.  It was during the time of his TEE that he also developed hematochezia.  Further history did reveal that he had a 35 lbs weight loss and he has a septic arthritis in his knee.  He was being treated for his moderate to severe AI and TR as well as afib with anticoagulation.  Previously he was on Eliquis for his Afib.  As a result of his hematochezia, a GI consultation was requested.  Past Medical History  Diagnosis Date  . Hypertension   . Myocardial infarction   . CHF (congestive heart failure)   . Shortness of breath dyspnea   . Arthritis     KNEES    History reviewed. No pertinent past surgical history.  Family History  Problem Relation Age of Onset  . Coronary artery disease Brother     Social History:  reports that he has never smoked. He has never used smokeless tobacco. He reports that he drinks about 0.6 oz of alcohol per week. He reports that he does not use illicit drugs.  Allergies:  Allergies  Allergen Reactions  . Penicillins     Unable to say what kind of reaction    Medications:  Scheduled: . amiodarone  200 mg Oral Daily  . atorvastatin  40 mg Oral QHS   . digoxin  125 mcg Oral Daily  . feeding supplement (ENSURE ENLIVE)  237 mL Oral BID BM  . metoprolol tartrate  25 mg Oral BID  . [START ON 07/15/2014] pantoprazole (PROTONIX) IV  40 mg Intravenous Q12H  . saccharomyces boulardii  250 mg Oral BID  . sodium chloride  3 mL Intravenous Q12H  . vancomycin  750 mg Intravenous Q12H   Continuous: . pantoprozole (PROTONIX) infusion 8 mg/hr (07/11/14 1300)    Results for orders placed or performed during the hospital encounter of 07/06/14 (from the past 24 hour(s))  Body fluid culture     Status: None (Preliminary result)   Collection Time: 07/10/14  2:00 PM  Result Value Ref Range   Specimen Description FLUID KNEE RIGHT SYNOVIAL    Special Requests NONE    Gram Stain      RARE WBC PRESENT,BOTH PMN AND MONONUCLEAR NO ORGANISMS SEEN Performed at [facility]    Culture NO GROWTH Performed at [facility]     Report Status PENDING   Synovial cell count + diff, w/ crystals     Status: Abnormal   Collection Time: 07/10/14  2:00 PM  Result Value Ref Range   Color, Synovial RED (A) YELLOW   Appearance-Synovial TURBID (A) CLEAR   Crystals, Fluid NO CRYSTALS SEEN    WBC, Synovial 233 (H) 0 - 200 /cu mm  Neutrophil, Synovial 73 (H) 0 - 25 %   Lymphocytes-Synovial Fld 20 0 - 20 %   Monocyte-Macrophage-Synovial Fluid 6 (L) 50 - 90 %   Eosinophils-Synovial 1 0 - 1 %  Heparin level (unfractionated)     Status: Abnormal   Collection Time: 07/10/14 11:59 PM  Result Value Ref Range   Heparin Unfractionated 0.21 (L) 0.30 - 0.70 IU/mL  Occult blood card to lab, stool     Status: Abnormal   Collection Time: 07/11/14  9:34 AM  Result Value Ref Range   Fecal Occult Bld POSITIVE (A) NEGATIVE     Dg Orthopantogram  07/10/2014   CLINICAL DATA:  Bacteremia. Infection in the mouth. Symptoms for 2 months.  EXAM: ORTHOPANTOGRAM/PANORAMIC  COMPARISON:  None.  FINDINGS: Positioning is nonstandard. No evidence for acute fracture of the  mandible. Majority of the maxillary teeth are absent. There is limited detail of the maxillary alveolar ridge, especially on the right side. Suspect caries of the left mandibular lateral incisor possibly associated with periapical abscess. There is limited detail regarding the central mandibular teeth.  IMPRESSION: 1. Limited detail. 2. Numerous absent teeth. 3. Suspect significant caries and possible periapical abscess of the left lateral mandibular incisor.   Electronically Signed   By: Nolon Nations M.D.   On: 07/10/2014 21:44   Dg Fluoro Guide Ndl Plcd/bx/inj/loc  07/10/2014   CLINICAL DATA:  Right knee swelling  EXAM: RIGHT KNEE ASPIRATION UNDER FLUOROSCOPY  FLUOROSCOPY TIME:  Radiation Exposure Index (as provided by the fluoroscopic device): 2.3 microGy*m^2  PROCEDURE: PROCEDURE  I discussed the risks (including hemorrhage, infection, and allergic reaction, among others), benefits, and alternatives to the procedure with the patient. We specifically discussed the likelihood of success of the procedure. The patient understood and elected to undergo the procedure.  Standard time-out was employed. The suprapatellar bursal region was wrapped with a compression bandage in order to force fluid down into the joint. Following sterile skin prep and local anesthetic administration consisting of 1% lidocaine, an 18 gauge needle was advanced into the knee joint from a medial approach below the patella. I obtained in image to show positioning. A total of 2 cc of sanguinous fluid was withdrawn. Despite repositioning so I was unable to get more fluid from the joint. The needle was subsequently removed and the skin cleansed and bandaged. No immediate complications were observed.  IMPRESSION: 1. Right knee arthrocentesis yielded 2 cc of sanguinous fluid. Despite repositioning side I was unable to get or joint fluid.   Electronically Signed   By: Van Clines M.D.   On: 07/10/2014 14:09    ROS:  As stated above in  the HPI otherwise negative.  Blood pressure 130/25, pulse 59, temperature 98 F (36.7 C), temperature source Oral, resp. rate 18, height 6' (1.829 m), weight 82.555 kg (182 lb), SpO2 96 %.    PE: Gen: NAD, Alert and Oriented HEENT:  [place]/AT, EOMI Neck: Supple, no LAD Lungs: CTA Bilaterally CV: RRR ABM: Soft, NTND, +BS Ext: No C/C/E Rectal: Brown stool, no visualized pathology  Assessment/Plan: 1) Hematochezia. 2) NSTEMI x 2, recently. 3) Infective endocarditis. 4) Aortic regurg. 5) Pulmonary HTN. 6) Afib.   I had a long discussion with the patient about his clinical status.  I also discussed his case with Dr. Debara Pickett.  The patient denies having a prior colonoscopy and there is no known family history of colon cancer.  Dr. Debara Pickett deems his clinical status as high risk.  At this time,  I feel that the risk of a colonoscopy is much too high and clearly outweighs any benefits.  The most likely source of bleeding is from a diverticular etiology and typically this is self-limited.  If he has AVMs, this is where the colonoscopy will be of most benefit.  The worst case scenario is a colon cancer and it is high doubtful that he would be a surgical candidate.  Plan: 1) No endoscopic work up.  His risk is too high. 2) Continue with current care per Cardiology and ID. 3) Signing off.  Call with any questions.    Cheri Ayotte D 07/11/2014, 1:04 PM

## 2014-07-12 ENCOUNTER — Encounter (HOSPITAL_COMMUNITY): Payer: Self-pay | Admitting: Cardiovascular Disease

## 2014-07-12 DIAGNOSIS — D509 Iron deficiency anemia, unspecified: Secondary | ICD-10-CM

## 2014-07-12 DIAGNOSIS — M25569 Pain in unspecified knee: Secondary | ICD-10-CM

## 2014-07-12 DIAGNOSIS — I481 Persistent atrial fibrillation: Secondary | ICD-10-CM

## 2014-07-12 DIAGNOSIS — I39 Endocarditis and heart valve disorders in diseases classified elsewhere: Secondary | ICD-10-CM

## 2014-07-12 DIAGNOSIS — I351 Nonrheumatic aortic (valve) insufficiency: Secondary | ICD-10-CM

## 2014-07-12 DIAGNOSIS — I2510 Atherosclerotic heart disease of native coronary artery without angina pectoris: Secondary | ICD-10-CM

## 2014-07-12 LAB — CBC
HCT: 32.2 % — ABNORMAL LOW (ref 39.0–52.0)
HEMOGLOBIN: 10.1 g/dL — AB (ref 13.0–17.0)
MCH: 25.3 pg — ABNORMAL LOW (ref 26.0–34.0)
MCHC: 31.4 g/dL (ref 30.0–36.0)
MCV: 80.7 fL (ref 78.0–100.0)
Platelets: 264 10*3/uL (ref 150–400)
RBC: 3.99 MIL/uL — ABNORMAL LOW (ref 4.22–5.81)
RDW: 18.3 % — ABNORMAL HIGH (ref 11.5–15.5)
WBC: 9.1 10*3/uL (ref 4.0–10.5)

## 2014-07-12 LAB — TYPE AND SCREEN
ABO/RH(D): A POS
ANTIBODY SCREEN: NEGATIVE
UNIT DIVISION: 0

## 2014-07-12 NOTE — Evaluation (Signed)
Physical Therapy Evaluation Patient Details Name: Joe Kennedy MRN: [id number] DOB: 11-17-1940 Today's Date: 07/12/2014   History of Present Illness  74 y.o. male admitted with Viridans group Streptoccal bacterermia and endocarditis.  Clinical Impression  Pt admitted with above diagnosis. Pt currently with functional limitations due to the deficits listed below (see PT Problem List). Ambulating at a min guard level with a rolling walker up to 75 feet, mild dyspnea with SpO2 of 94% on room air (may have dropped lower but unable to obtain rapid sats check), HR to 115. Lives with his wife at home and was using crutches prior to admission due to Rt knee pain. Pt will benefit from skilled PT to increase their independence and safety with mobility to allow discharge to the venue listed below.       Follow Up Recommendations Home health PT;Supervision for mobility/OOB (May need updated pending medical interventions)    Equipment Recommendations  Rolling walker with 5" wheels;3in1 (PT)    Recommendations for Other Services       Precautions / Restrictions Precautions Precautions: Fall Restrictions Weight Bearing Restrictions: No RLE Weight Bearing: Partial weight bearing      Mobility  Bed Mobility Overal bed mobility: Modified Independent             General bed mobility comments: Requires extra time  Transfers Overall transfer level: Needs assistance Equipment used: Rolling walker (2 wheeled) Transfers: Sit to/from Stand Sit to Stand: Min guard         General transfer comment: Close guard for safety. VC for hand placement. Relies heavily on Rw upon standing.  Ambulation/Gait Ambulation/Gait assistance: Min guard Ambulation Distance (Feet): 75 Feet Assistive device: Rolling walker (2 wheeled) Gait Pattern/deviations: Step-through pattern;Decreased step length - left;Decreased stance time - right;Antalgic;Trunk flexed Gait velocity: decreased   General Gait  Details: Required one standing rest break to complete distance. VC for upright posture. Instructions for safe DME use with a rolling walker. No loss of balance noted, but relies heavily on RW. States he feels safer with this device compared to crutches he has been using at home. HR to 115  Stairs            Wheelchair Mobility    Modified Rankin (Stroke Patients Only)       Balance Overall balance assessment: Needs assistance;History of Falls Sitting-balance support: No upper extremity supported;Feet supported Sitting balance-Leahy Scale: Good     Standing balance support: Bilateral upper extremity supported Standing balance-Leahy Scale: Poor                               Pertinent Vitals/Pain Pain Assessment: No/denies pain    Home Living Family/patient expects to be discharged to:: Private residence Living Arrangements: Spouse/significant other Available Help at Discharge: Family;Available 24 hours/day Type of Home: House Home Access: Stairs to enter Entrance Stairs-Rails: Psychiatric nurse of Steps: 4 Home Layout: Other (Comment) (down 2 steps to enter living room - no rail) Home Equipment: Crutches;Cane - single point      Prior Function Level of Independence: Independent with assistive device(s)         Comments: crutches for ambulation     Hand Dominance   Dominant Hand: Right    Extremity/Trunk Assessment   Upper Extremity Assessment: Defer to OT evaluation           Lower Extremity Assessment: Generalized weakness  Communication   Communication: No difficulties  Cognition Arousal/Alertness: Awake/alert Behavior During Therapy: WFL for tasks assessed/performed Overall Cognitive Status: Within Functional Limits for tasks assessed                      General Comments General comments (skin integrity, edema, etc.): Became dyspneic after walk. Unable to obtain SpO2 reading until after patient  had been sitting for several minutes, at which time SpO2 registered at 94% on room air.    Exercises General Exercises - Lower Extremity Ankle Circles/Pumps: AROM;Both;10 reps;Seated Quad Sets: Strengthening;Both;10 reps;Seated      Assessment/Plan    PT Assessment Patient needs continued PT services  PT Diagnosis Difficulty walking;Abnormality of gait;Generalized weakness   PT Problem List Decreased strength;Decreased activity tolerance;Decreased balance;Decreased mobility;Decreased knowledge of use of DME;Cardiopulmonary status limiting activity;Decreased range of motion  PT Treatment Interventions DME instruction;Gait training;Stair training;Functional mobility training;Therapeutic activities;Therapeutic exercise;Balance training;Neuromuscular re-education;Patient/family education   PT Goals (Current goals can be found in the Care Plan section) Acute Rehab PT Goals Patient Stated Goal: Walk better PT Goal Formulation: With patient Time For Goal Achievement: 07/26/14 Potential to Achieve Goals: Fair    Frequency Min 3X/week   Barriers to discharge        Co-evaluation               End of Session Equipment Utilized During Treatment: Gait belt Activity Tolerance: Patient tolerated treatment well Patient left: in chair;with call bell/phone within reach Nurse Communication: Mobility status (poor SpO2 reading while ambulating- may want to recheck)         Time: 0929-0958 PT Time Calculation (min) (ACUTE ONLY): 29 min   Charges:   PT Evaluation $Initial PT Evaluation Tier I: 1 Procedure PT Treatments $Gait Training: 8-22 mins   PT G CodesEllouise Newer 07/12/2014, 10:51 AM  Elayne Snare, [place]

## 2014-07-12 NOTE — Progress Notes (Signed)
Pt converted to A Fib this morning around 0400. Heart rate 95-105 and blood pressure 141/62. Pt is stable. On call physician notified. Will continue to monitor.  Joe Kennedy E

## 2014-07-12 NOTE — Consult Note (Signed)
[address]       [place] [address]             [phone]      Cardiothoracic Surgery Consultation  Reason for Consult: aortic valve endocarditis Referring Physician: Irine Seal, MD  Joe Kennedy is an 74 y.o. male.  HPI:   The patient has a history of atrial fibrillation on Eliquis, HTN, HLD, chronic systolic heart failure with an EF of 35% by cath. He was apparently hospitalized in earlier March after awaking with chest pain radiating down both arms not relieved with aspirin. He ruled in for NSTEMI and had a cath showing two-vessel disease not felt to be amenable to intervention and medical management was recommended. He was sent home and was readmitted to [facility] on 07/04/2014 after having a syncopal episode in his PCP's office. This hospitalization was further complicated by hypotension, right lower lobe pneumonia, acute hypoxic respiratory failure requiring BiPAP, acute renal failure, bacteremia ( gram + cocci), afib with RVR and NSTEMI. During his admission he developed further chest pain and his troponin was noted to be elevated and peaked around 11. His cardiologist, Dr. Clayborn Bigness, was notified and he recommended CABG. He was transferred to [facility] for further evaluation.  He was noted to have a diastolic murmur of AI on exam and an echo showed aortic valve endocarditis with severe AI, severe TR and mild MR. BC grew viridans strep. He has been seen by ID and is on vanc. The patient relates a 2-3 month history of feeling poorly with chills, poor appetite, 35 lb wt loss, progressive fatigue.    Past Medical History  Diagnosis Date  . Hypertension   . Myocardial infarction   . CHF (congestive heart failure)   . Shortness of breath dyspnea   . Arthritis     KNEES    Past Surgical History  Procedure Laterality Date  . Tee without cardioversion N/A 07/11/2014    Procedure: TRANSESOPHAGEAL ECHOCARDIOGRAM (TEE);  Surgeon: Josue Hector, MD;  Location: [facility] ENDOSCOPY;  Service: Cardiovascular;  Laterality: N/A;    Family History  Problem Relation Age of Onset  . Coronary artery disease Brother     Social History:  reports that he has never smoked. He has never used smokeless tobacco. He reports that he drinks about 0.6 oz of alcohol per week. He reports that he does not use illicit drugs.  Allergies:  Allergies  Allergen Reactions  . Penicillins     Unable to say what kind of reaction    Medications:  I have reviewed the patient's current medications. Prior to Admission:  Prescriptions prior to admission  Medication Sig Dispense Refill Last Dose  . digoxin (LANOXIN) 0.125 MG tablet Take 125 mcg by mouth daily.  6 Past Week at Unknown time  . etodolac (LODINE) 500 MG tablet Take 500 mg by mouth 2 (two) times daily.  0 Past Week at Unknown time  . isosorbide mononitrate (IMDUR) 30 MG 24 hr tablet Take 30 mg by mouth every morning.  0 Past Week at Unknown time  . lisinopril (PRINIVIL,ZESTRIL) 10 MG tablet Take 10 mg by mouth daily.  0 Past Week at Unknown time  . metoprolol tartrate (LOPRESSOR) 25 MG tablet Take 25 mg by mouth 2 (two) times daily.  6 Past Week at Unknown time  . traMADol (ULTRAM) 50 MG tablet Take 50 mg by mouth 3 (three) times daily as needed for moderate  pain.   Past Week at Unknown time  . amiodarone (PACERONE) 200 MG tablet Take 200 mg by mouth daily.  0 07/04/2014  . atorvastatin (LIPITOR) 40 MG tablet Take 40 mg by mouth daily.  0 07/04/2014  . ELIQUIS 5 MG TABS tablet Take 5 mg by mouth 2 (two) times daily.  0 07/04/2014  . NITROSTAT 0.4 MG SL tablet Take 0.4 mg by mouth. Dissolve 1 tablet under the tongue every 5 minutes as needed for chest pain up to 3 times.  0 07/04/2014   Scheduled: . amiodarone  200 mg Oral Daily  . atorvastatin  40 mg Oral QHS  . digoxin  125 mcg Oral Daily  . feeding supplement (ENSURE ENLIVE)  237 mL Oral BID BM  . metoprolol tartrate  25 mg Oral BID  . [START  ON 07/15/2014] pantoprazole (PROTONIX) IV  40 mg Intravenous Q12H  . saccharomyces boulardii  250 mg Oral BID  . sodium chloride  3 mL Intravenous Q12H  . vancomycin  750 mg Intravenous Q12H   Continuous: . pantoprozole (PROTONIX) infusion 8 mg/hr (07/12/14 1149)   HBZ:JIRCVE chloride, acetaminophen, calcium carbonate, nitroGLYCERIN, ondansetron (ZOFRAN) IV, sodium chloride Anti-infectives    Start     Dose/Rate Route Frequency Ordered Stop   07/10/14 1200  levofloxacin (LEVAQUIN) IVPB 750 mg     750 mg 100 mL/hr over 90 Minutes Intravenous Every 24 hours 07/10/14 0708 07/10/14 1314   07/08/14 1200  levofloxacin (LEVAQUIN) IVPB 750 mg  Status:  Discontinued     750 mg 100 mL/hr over 90 Minutes Intravenous Every 24 hours 07/08/14 1124 07/10/14 0708   07/07/14 1000  levofloxacin (LEVAQUIN) tablet 750 mg  Status:  Discontinued     750 mg Oral Daily 07/06/14 1656 07/06/14 1734   07/07/14 1000  levofloxacin (LEVAQUIN) tablet 750 mg  Status:  Discontinued     750 mg Oral Every 48 hours 07/06/14 1734 07/07/14 0954   07/07/14 1000  levofloxacin (LEVAQUIN) tablet 750 mg  Status:  Discontinued     750 mg Oral Daily 07/07/14 0954 07/08/14 1124   07/06/14 2200  vancomycin (VANCOCIN) IVPB 750 mg/150 ml premix     750 mg 150 mL/hr over 60 Minutes Intravenous Every 12 hours 07/06/14 1734        Results for orders placed or performed during the hospital encounter of 07/06/14 (from the past 48 hour(s))  Heparin level (unfractionated)     Status: Abnormal   Collection Time: 07/10/14 11:59 PM  Result Value Ref Range   Heparin Unfractionated 0.21 (L) 0.30 - 0.70 IU/mL    Comment:        IF HEPARIN RESULTS ARE BELOW EXPECTED VALUES, AND PATIENT DOSAGE HAS BEEN CONFIRMED, SUGGEST FOLLOW UP TESTING OF ANTITHROMBIN III LEVELS.   Occult blood card to lab, stool     Status: Abnormal   Collection Time: 07/11/14  9:34 AM  Result Value Ref Range   Fecal Occult Bld POSITIVE (A) NEGATIVE  Basic metabolic  panel     Status: Abnormal   Collection Time: 07/11/14 11:43 AM  Result Value Ref Range   Sodium 139 135 - 145 mmol/L   Potassium 4.4 3.5 - 5.1 mmol/L   Chloride 111 96 - 112 mmol/L   CO2 25 19 - 32 mmol/L   Glucose, Bld 91 70 - 99 mg/dL   BUN 15 6 - 23 mg/dL   Creatinine, Ser 1.35 0.50 - 1.35 mg/dL   Calcium 8.0 (L) 8.4 - 10.5  mg/dL   GFR calc non Af Amer 50 (L) >90 mL/min   GFR calc Af Amer 58 (L) >90 mL/min    Comment: (NOTE) The eGFR has been calculated using the CKD EPI equation. This calculation has not been validated in all clinical situations. eGFR's persistently <90 mL/min signify possible Chronic Kidney Disease.    Anion gap 3 (L) 5 - 15  CBC     Status: Abnormal   Collection Time: 07/11/14 11:43 AM  Result Value Ref Range   WBC 8.1 4.0 - 10.5 K/uL    Comment: WHITE COUNT CONFIRMED ON SMEAR   RBC 3.53 (L) 4.22 - 5.81 MIL/uL   Hemoglobin 8.9 (L) 13.0 - 17.0 g/dL   HCT 28.6 (L) 39.0 - 52.0 %   MCV 81.0 78.0 - 100.0 fL   MCH 25.2 (L) 26.0 - 34.0 pg   MCHC 31.1 30.0 - 36.0 g/dL   RDW 18.2 (H) 11.5 - 15.5 %   Platelets 282 150 - 400 K/uL  Prepare RBC     Status: None   Collection Time: 07/11/14 12:03 PM  Result Value Ref Range   Order Confirmation ORDER PROCESSED BY BLOOD BANK   Type and screen     Status: None   Collection Time: 07/11/14  1:50 PM  Result Value Ref Range   ABO/RH(D) A POS    Antibody Screen NEG    Sample Expiration 07/14/2014    Unit Number [id number]    Blood Component Type RED CELLS,LR    Unit division 00    Status of Unit ISSUED,FINAL    Transfusion Status OK TO TRANSFUSE    Crossmatch Result Compatible   ABO/Rh     Status: None   Collection Time: 07/11/14  1:50 PM  Result Value Ref Range   ABO/RH(D) A POS   CBC     Status: Abnormal   Collection Time: 07/12/14  4:02 AM  Result Value Ref Range   WBC 9.1 4.0 - 10.5 K/uL   RBC 3.99 (L) 4.22 - 5.81 MIL/uL   Hemoglobin 10.1 (L) 13.0 - 17.0 g/dL   HCT 32.2 (L) 39.0 - 52.0 %   MCV 80.7  78.0 - 100.0 fL   MCH 25.3 (L) 26.0 - 34.0 pg   MCHC 31.4 30.0 - 36.0 g/dL   RDW 18.3 (H) 11.5 - 15.5 %   Platelets 264 150 - 400 K/uL    Dg Orthopantogram  07/10/2014   CLINICAL DATA:  Bacteremia. Infection in the mouth. Symptoms for 2 months.  EXAM: ORTHOPANTOGRAM/PANORAMIC  COMPARISON:  None.  FINDINGS: Positioning is nonstandard. No evidence for acute fracture of the mandible. Majority of the maxillary teeth are absent. There is limited detail of the maxillary alveolar ridge, especially on the right side. Suspect caries of the left mandibular lateral incisor possibly associated with periapical abscess. There is limited detail regarding the central mandibular teeth.  IMPRESSION: 1. Limited detail. 2. Numerous absent teeth. 3. Suspect significant caries and possible periapical abscess of the left lateral mandibular incisor.   Electronically Signed   By: Nolon Nations M.D.   On: 07/10/2014 21:44   Mr Knee Right Wo Contrast  07/12/2014   CLINICAL DATA:  Recent acute onset of knee pain. Limited range of motion. No fluid aspirated on attempted arthrocentesis. Initial encounter.  EXAM: MRI OF THE RIGHT KNEE WITHOUT CONTRAST  TECHNIQUE: Multiplanar, multisequence MR imaging of the knee was performed. No intravenous contrast was administered.  COMPARISON:  None.  FINDINGS: MENISCI  Medial  meniscus: There is extensive degenerative tearing throughout the medial meniscus, primarily within the posterior horn and body. The meniscus is largely extruded from the joint.  Lateral meniscus:  Diffuse degeneration without focal tear.  LIGAMENTS  Cruciates:  Intact.  There is mild ACL mucoid degeneration.  Collaterals:  Intact.  CARTILAGE  Patellofemoral: Moderate patellofemoral degenerative changes with chondral thinning and osteophytes. No focal subchondral signal abnormality.  Medial: Advanced osteoarthritis with diffuse chondral thinning, osteophytes and subchondral eburnation.  Lateral: Moderate osteoarthritis with  chondral thinning, surface irregularity and osteophytes.  Joint:  Minimal complex joint effusion.  Popliteal Fossa:  Unremarkable. No significant Baker's cyst.  Extensor Mechanism: Intact. There is moderate prepatellar subcutaneous edema without focal fluid collection. There is mild edema centrally in Hoffa's fat.  Bones: No significant extra-articular osseous findings. There is no cortical destruction or suspicious periarticular edema to suggest osteomyelitis.  IMPRESSION: 1. Tricompartmental osteoarthritis, most advanced within the medial compartment. 2. Small complex knee joint effusion. No specific signs of joint infection or osteomyelitis on noncontrast imaging. 3. Extensive degenerative tearing of the medial meniscus. 4. ACL mucoid degeneration.   Electronically Signed   By: Richardean Sale M.D.   On: 07/12/2014 08:41    Review of Systems  Constitutional: Positive for chills, weight loss and malaise/fatigue. Negative for fever.  HENT:       History of bad teeth. Has had multiple teeth knocked out in the past and has pulled 4 or 5 teeth in the past when they were loose and hurting. No current mouth or tooth pain.  Eyes: Negative.   Respiratory: Negative for cough, sputum production and shortness of breath.   Cardiovascular: Positive for chest pain. Negative for orthopnea, leg swelling and PND.  Gastrointestinal: Positive for blood in stool. Negative for nausea, vomiting, abdominal pain and melena.  Genitourinary: Negative.   Musculoskeletal:       Pain in right knee that developed about 1-2 months ago while sleeping. Then the next day he fell on that knee and has had pain, swelling and difficulty with mobilization since.  Skin: Negative.   Neurological: Negative.   Endo/Heme/Allergies: Negative.   Psychiatric/Behavioral: Negative.    Blood pressure 130/45, pulse 82, temperature 97.9 F (36.6 C), temperature source Oral, resp. rate 18, height 6' (1.829 m), weight 85.73 kg (189 lb), SpO2 99  %. Physical Exam  Constitutional: He is oriented to person, place, and time.  Chronically ill-appearing gentleman in no distress  HENT:  Head: Normocephalic and atraumatic.  Poor dentition  Eyes: EOM are normal. Pupils are equal, round, and reactive to light.  Neck: Normal range of motion. Neck supple. No thyromegaly present.  Cardiovascular: Normal rate and regular rhythm.   Murmur heard. 3/6  Diastolic murmur along LLSB  Respiratory: Effort normal and breath sounds normal. No respiratory distress. He has no rales. He exhibits no tenderness.  GI: Soft. Bowel sounds are normal. He exhibits no distension and no mass. There is no tenderness.  Musculoskeletal:  Some swelling about right knee  Neurological: He is alert and oriented to person, place, and time. He has normal strength. No cranial nerve deficit or sensory deficit.  Skin: Skin is warm and dry.  Psychiatric: He has a normal mood and affect.               *[facility]*            [place] [address]  [place], [place] [address]              [phone]  ------------------------------------------------------------------- Transesophageal Echocardiography  Patient:  Toran, Murch MR #:    [id number] Study Date: 07/11/2014 Gender:   M Age:    84 Height:   182.9 cm Weight:   82.7 kg BSA:    2.06 m^2 Pt. Status: Room:    3W18C  PERFORMING  Jenkins Rouge, M.D. Aram Beecham ATTENDING  Reyne Dumas [id number] SONOGRAPHER Johny Chess, RDCS, CCT ADMITTING  Nahser, Brooke Bonito  cc:  -------------------------------------------------------------------  ------------------------------------------------------------------- Indications:   Bacteremia 790.7.  ------------------------------------------------------------------- Study Conclusions  - Impressions: Mild LVE EF  60% Aortic valve SBE with small marantic like vegetations no abscess Severe AR Regurgitation largest though commisure between right and non coronary cusps. Mild MR Severe TR estimated PA pressure 74 mmHg Normla PV LAE No aortic debris  Recommend Rx SBE with 6 weeks of antibiotics and consideration for repeat TEE possible need for surgery given severe AR Future TEE;s need propofol as intubation of esophagus very difficult  Jenkins Rouge  Impressions:  - Mild LVE EF 60% Aortic valve SBE with small marantic like vegetations no abscess Severe AR Regurgitation largest though commisure between right and non coronary cusps. Mild MR Severe TR estimated PA pressure 74 mmHg Normla PV LAE No aortic debris  Recommend Rx SBE with 6 weeks of antibiotics and consideration for repeat TEE possible need for surgery given severe AR Future TEE;s need propofol as intubation of esophagus very difficult  Jenkins Rouge  Diagnostic transesophageal echocardiography. 2D and color Doppler. Birthdate: Patient birthdate: 03-06-1941. Age: Patient is 74 yr old. Sex: Gender: male.  BMI: 24.7 kg/m^2. Blood pressure: 148/27 Patient status: Inpatient. Study date: Study date: 07/11/2014. Study time: 10:17 AM. Location: Endoscopy.  -------------------------------------------------------------------  ------------------------------------------------------------------- Prepared and Electronically Authenticated by  Jenkins Rouge, M.D. 2016-04-05T11:42:21                  *[facility]*            [address] [place], [place] [address]              [phone]  ------------------------------------------------------------------- Transthoracic Echocardiography  Patient:  Traver, Meckes MR #:    [id number] Study  Date: 07/08/2014 Gender:   M Age:    52 Height:   185.4 cm Weight:   82.6 kg BSA:    2.06 m^2 Pt. Status: Room:    3W18C  ATTENDING  Abrol, [place], Inpatient ORDERING   Eileen Stanford REFERRING  Angelena Form R ADMITTING  Nahser, Jr  cc:  ------------------------------------------------------------------- LV EF: 55% -  60%  ------------------------------------------------------------------- Indications:   CHF-Acute Systolic 532.21.  ------------------------------------------------------------------- History:  PMH:  Atrial fibrillation. Acute myocardial infarction. Bacteremia.  ------------------------------------------------------------------- Study Conclusions  - Left ventricle: The cavity size was mildly dilated. There was mild concentric hypertrophy. Systolic function was normal. The estimated ejection fraction was in the range of 50% to 55%. Features are consistent with a pseudonormal left ventricular filling pattern, with concomitant abnormal relaxation and increased filling pressure (grade 2 diastolic dysfunction). - Regional wall motion abnormality: Mild hypokinesis of the mid-apical anterior, mid anterolateral, apical lateral, and apical myocardium. - Aortic valve: Trileaflet; mildly thickened, mildly calcified leaflets. There was no stenosis. There was moderate to severe  regurgitation. - Mitral valve: Mildly thickened leaflets . There was mild regurgitation. - Left atrium: The atrium was mildly dilated. Volume/bsa, ES, (1-plane Simpson&'s, A2C): 30.1 ml/m^2. - Right ventricle: Systolic function was mildly reduced. - Right atrium: The atrium was mildly dilated. - Tricuspid valve: There was moderate-severe regurgitation. - Pulmonary arteries: PA peak pressure: 69 mm Hg (S). Severely elevated pulmonary pressures. - Inferior vena cava:  The vessel was dilated. The respirophasic diameter changes were blunted (< 50%), consistent with elevated central venous pressure.  Transthoracic echocardiography. M-mode, complete 2D, spectral Doppler, and color Doppler. Birthdate: Patient birthdate: Nov 10, 1940. Age: Patient is 74 yr old. Sex: Gender: male. BMI: 24 kg/m^2. Blood pressure:   137/34 Patient status: Inpatient. Study date: Study date: 07/08/2014. Study time: 01:01 PM. Location: Bedside.  -------------------------------------------------------------------  ------------------------------------------------------------------- Left ventricle: The cavity size was mildly dilated. There was mild concentric hypertrophy. Systolic function was normal. The estimated ejection fraction was in the range of 50% to 55%. Regional wall motion abnormalities: Mild hypokinesis of the mid-apical anterior, mid anterolateral, apical lateral, and apical myocardium. Features are consistent with a pseudonormal left ventricular filling pattern, with concomitant abnormal relaxation and increased filling pressure (grade 2 diastolic dysfunction). There was no evidence of elevated ventricular filling pressure by Doppler parameters.  ------------------------------------------------------------------- Aortic valve:  Trileaflet; mildly thickened, mildly calcified leaflets. Doppler:  There was no stenosis.  There was moderate to severe regurgitation.  ------------------------------------------------------------------- Aorta: Aortic root: The aortic root was normal in size.  ------------------------------------------------------------------- Mitral valve:  Mildly thickened leaflets . Doppler: There was mild regurgitation.  ------------------------------------------------------------------- Left atrium: The atrium was mildly dilated.  ------------------------------------------------------------------- Atrial septum: No  defect or patent foramen ovale was identified.  ------------------------------------------------------------------- Right ventricle: The cavity size was normal. Wall thickness was normal. Systolic function was mildly reduced.  ------------------------------------------------------------------- Pulmonic valve:  The valve appears to be grossly normal. Doppler: There was trivial regurgitation.  ------------------------------------------------------------------- Tricuspid valve:  Normal thickness leaflets. Doppler: There was moderate-severe regurgitation.  ------------------------------------------------------------------- Pulmonary artery:  Severely elevated pulmonary pressures.  ------------------------------------------------------------------- Right atrium: The atrium was mildly dilated.  ------------------------------------------------------------------- Pericardium: There was no pericardial effusion.  ------------------------------------------------------------------- Systemic veins: Inferior vena cava: The vessel was dilated. The respirophasic diameter changes were blunted (< 50%), consistent with elevated central venous pressure.  ------------------------------------------------------------------- Measurements  Left ventricle              Value    Reference LV ID, ED, PLAX chordal     (H)   61  mm   43 - 52 LV ID, ES, PLAX chordal         36  mm   23 - 38 LV fx shortening, PLAX chordal      41  %   >=29 LV PW thickness, ED           10  mm   --------- IVS/LV PW ratio, ED           1      <=1.3 Stroke volume, 2D            95  ml   --------- Stroke volume/bsa, 2D          46  ml/m^2 --------- LV e&', lateral              4.37 cm/s  --------- LV E/e&', lateral             12.72    --------- LV e&', medial  6.09 cm/s  --------- LV E/e&', medial             9.13     --------- LV e&', average              5.23 cm/s  --------- LV E/e&', average             10.63    ---------  Ventricular septum            Value    Reference IVS thickness, ED            10  mm   ---------  LVOT                   Value    Reference LVOT ID, S                21  mm   --------- LVOT area                3.46 cm^2  --------- LVOT peak velocity, S          138  cm/s  --------- LVOT mean velocity, S          83.2 cm/s  --------- LVOT VTI, S               27.5 cm   --------- LVOT peak gradient, S          8   mm Hg ---------  Aortic valve               Value    Reference Aortic regurg pressure half-time     226  ms   ---------  Aorta                  Value    Reference Aortic root ID, ED            35  mm   ---------  Left atrium               Value    Reference LA ID, A-P, ES              52  mm   --------- LA ID/bsa, A-P          (H)   2.52 cm/m^2 <=2.2 LA volume, ES, 1-p A4C          76.6 ml   --------- LA volume/bsa, ES, 1-p A4C        37.1 ml/m^2 --------- LA volume, ES, 1-p A2C          62.1 ml   --------- LA volume/bsa, ES, 1-p A2C        30.1 ml/m^2 ---------  Mitral valve               Value    Reference Mitral E-wave peak velocity       55.6 cm/s  --------- Mitral A-wave peak velocity       35.7 cm/s  --------- Mitral deceleration time     (H)   310  ms   150 - 230 Mitral E/A ratio, peak          1.6     ---------  Pulmonary arteries             Value    Reference PA pressure, S, DP        (H)   69  mm Hg <=30  Tricuspid valve  Value    Reference Tricuspid regurg peak velocity      366  cm/s  --------- Tricuspid peak RV-RA gradient      54  mm Hg ---------  Systemic veins              Value    Reference Estimated CVP              15  mm Hg ---------  Right ventricle             Value    Reference RV pressure, S, DP        (H)   69  mm Hg <=30  Legend: (L) and (H) mark values outside specified reference range.  ------------------------------------------------------------------- Prepared and Electronically Authenticated by  Kate Sable, MD 2016-04-02T14:18:01   Assessment/Plan:  The patient has strep viridans aortic valve endocarditis with severe AI presenting with a several month history of chills, poor appetite and weight loss. He also has severe TR with no apparent vegetation on that valve and the valve structurally looks good. He has moderate CAD with no significant stenosis in the LAD but a significant antero-apical wall motion abnormality on LV gram. That could be due to embolic disease from the endocarditis. The OM is occluded but small and probably not graftable. The distal RCA has significant disease that could be grafted. He has bad teeth and will need to be seen by Dr. Lawana Chambers and will probably need to have the remaining teeth pulled. I will call him. He has been hemodynamically stable and tolerating the AI well so urgent surgery not indicated and I think his teeth should be dealt with first. His LV function on the recent echos looks better than the cath. He has also had this right knee pain and had a joint aspiration with only a small amount of fluid that had a negative gram stain and negative culture so far. An MR did show some osteoarthritis and extensive degenerative  tearing of the medial meniscus but no specific signs of infection or osteo. He has severe malnutrition and will benefit from a period of nutrition and antibiotic treatment of his endocarditis while his dental workup and treatment is done. I will ask nutritionist to see him. I will continue to follow his progress and decide when to do surgery.  Gaye Pollack 07/12/2014, 7:11 PM

## 2014-07-12 NOTE — Progress Notes (Signed)
[place] [facility] Infectious [facility]    Subjective: No new complaints   Antibiotics:  Anti-infectives    Start     Dose/Rate Route Frequency Ordered Stop   07/10/14 1200  levofloxacin (LEVAQUIN) IVPB 750 mg     750 mg 100 mL/hr over 90 Minutes Intravenous Every 24 hours 07/10/14 0708 07/10/14 1314   07/08/14 1200  levofloxacin (LEVAQUIN) IVPB 750 mg  Status:  Discontinued     750 mg 100 mL/hr over 90 Minutes Intravenous Every 24 hours 07/08/14 1124 07/10/14 0708   07/07/14 1000  levofloxacin (LEVAQUIN) tablet 750 mg  Status:  Discontinued     750 mg Oral Daily 07/06/14 1656 07/06/14 1734   07/07/14 1000  levofloxacin (LEVAQUIN) tablet 750 mg  Status:  Discontinued     750 mg Oral Every 48 hours 07/06/14 1734 07/07/14 0954   07/07/14 1000  levofloxacin (LEVAQUIN) tablet 750 mg  Status:  Discontinued     750 mg Oral Daily 07/07/14 0954 07/08/14 1124   07/06/14 2200  vancomycin (VANCOCIN) IVPB 750 mg/150 ml premix     750 mg 150 mL/hr over 60 Minutes Intravenous Every 12 hours 07/06/14 1734        Medications: Scheduled Meds: . amiodarone  200 mg Oral Daily  . atorvastatin  40 mg Oral QHS  . digoxin  125 mcg Oral Daily  . feeding supplement (ENSURE ENLIVE)  237 mL Oral BID BM  . metoprolol tartrate  25 mg Oral BID  . [START ON 07/15/2014] pantoprazole (PROTONIX) IV  40 mg Intravenous Q12H  . saccharomyces boulardii  250 mg Oral BID  . sodium chloride  3 mL Intravenous Q12H  . vancomycin  750 mg Intravenous Q12H   Continuous Infusions: . pantoprozole (PROTONIX) infusion 8 mg/hr (07/12/14 1149)   PRN Meds:.sodium chloride, acetaminophen, calcium carbonate, nitroGLYCERIN, ondansetron (ZOFRAN) IV, sodium chloride    Objective: Weight change: 7 lb (3.175 kg)  Intake/Output Summary (Last 24 hours) at 07/12/14 2013 Last data filed at 07/12/14 1620  Gross per 24 hour  Intake 2172.74 ml  Output    950 ml  Net 1222.74 ml   Blood pressure 130/45, pulse 82, temperature  97.9 F (36.6 C), temperature source Oral, resp. rate 18, height 6' (1.829 m), weight 189 lb (85.73 kg), SpO2 99 %. Temp:  [97.7 F (36.5 C)-97.9 F (36.6 C)] 97.9 F (36.6 C) (04/06 1355) Pulse Rate:  [82-112] 82 (04/06 1355) Resp:  [18] 18 (04/06 1355) BP: (130-141)/(45-62) 130/45 mmHg (04/06 1355) SpO2:  [95 %-99 %] 99 % (04/06 1355) Weight:  [189 lb (85.73 kg)] 189 lb (85.73 kg) (04/06 0544)  Physical Exam: General: Alert and awake, oriented x3, not in any acute distress. HEENT: anicteric sclera, sclera clear EOMI he has very poor dentition CVS irr regular rate, normal r,  no murmur rubs or gallops Chest: clear to auscultation bilaterally, no wheezing, rales or rhonchi Abdomen: soft nontender, nondistended, normal bowel sounds, Knee is tender swollen Skin: no rashes, no oslers, Janeway's  Neuro: nonfocal  CBC: CBC    Component Value Date/Time   WBC 9.1 07/12/2014 0402   RBC 3.99* 07/12/2014 0402   HGB 10.1* 07/12/2014 0402   HCT 32.2* 07/12/2014 0402   PLT 264 07/12/2014 0402   MCV 80.7 07/12/2014 0402   MCH 25.3* 07/12/2014 0402   MCHC 31.4 07/12/2014 0402   RDW 18.3* 07/12/2014 0402      BMET  Recent Labs  07/10/14 0530 07/11/14 1143  NA 138  139  K 4.4 4.4  CL 109 111  CO2 22 25  GLUCOSE 91 91  BUN 13 15  CREATININE 1.28 1.35  CALCIUM 8.2* 8.0*     Liver Panel   Recent Labs  07/10/14 0530  PROT 5.6*  ALBUMIN 2.0*  AST 33  ALT 24  ALKPHOS 69  BILITOT 0.6       Sedimentation Rate No results for input(s): ESRSEDRATE in the last 72 hours. C-Reactive Protein No results for input(s): CRP in the last 72 hours.  Micro Results: Recent Results (from the past 720 hour(s))  Culture, blood (routine x 2)     Status: None   Collection Time: 07/06/14  7:40 PM  Result Value Ref Range Status   Specimen Description BLOOD LEFT ANTECUBITAL  Final   Special Requests BOTTLES DRAWN AEROBIC AND ANAEROBIC 3CC EA  Final   Culture   Final    VIRIDANS  STREPTOCOCCUS Note: Gram Stain Report Called to,Read Back By and Verified With: DEVON RN ON 3W AT 1412 [id number] BY CASTC Performed at [facility]    Report Status 07/11/2014 FINAL  Final  Culture, blood (routine x 2)     Status: None (Preliminary result)   Collection Time: 07/06/14  7:50 PM  Result Value Ref Range Status   Specimen Description BLOOD LEFT HAND  Final   Special Requests BOTTLES DRAWN AEROBIC ONLY 2CC  Final   Culture   Final           BLOOD CULTURE RECEIVED NO GROWTH TO DATE CULTURE WILL BE HELD FOR 5 DAYS BEFORE ISSUING A FINAL NEGATIVE REPORT Performed at [facility]    Report Status PENDING  Incomplete  Culture, blood (routine x 2)     Status: None (Preliminary result)   Collection Time: 07/07/14 10:46 AM  Result Value Ref Range Status   Specimen Description BLOOD LEFT ARM  Final   Special Requests BOTTLES DRAWN AEROBIC AND ANAEROBIC 10CC EACH  Final   Culture   Final           BLOOD CULTURE RECEIVED NO GROWTH TO DATE CULTURE WILL BE HELD FOR 5 DAYS BEFORE ISSUING A FINAL NEGATIVE REPORT Performed at [facility]    Report Status PENDING  Incomplete  Culture, blood (routine x 2)     Status: None (Preliminary result)   Collection Time: 07/07/14 10:53 AM  Result Value Ref Range Status   Specimen Description BLOOD LEFT HAND  Final   Special Requests BOTTLES DRAWN AEROBIC AND ANAEROBIC 10CC EACH  Final   Culture   Final           BLOOD CULTURE RECEIVED NO GROWTH TO DATE CULTURE WILL BE HELD FOR 5 DAYS BEFORE ISSUING A FINAL NEGATIVE REPORT Performed at [facility]    Report Status PENDING  Incomplete  Clostridium Difficile by PCR     Status: None   Collection Time: 07/09/14 12:42 AM  Result Value Ref Range Status   C difficile by pcr NEGATIVE NEGATIVE Final  Body fluid culture     Status: None (Preliminary result)   Collection Time: 07/10/14  2:00 PM  Result Value Ref Range Status   Specimen Description FLUID KNEE RIGHT  SYNOVIAL  Final   Special Requests NONE  Final   Gram Stain   Final    RARE WBC PRESENT,BOTH PMN AND MONONUCLEAR NO ORGANISMS SEEN Performed at [facility]    Culture   Final    NO GROWTH  1 DAY Performed at [facility]    Report Status PENDING  Incomplete    Studies/Results: Dg Orthopantogram  07/10/2014   CLINICAL DATA:  Bacteremia. Infection in the mouth. Symptoms for 2 months.  EXAM: ORTHOPANTOGRAM/PANORAMIC  COMPARISON:  None.  FINDINGS: Positioning is nonstandard. No evidence for acute fracture of the mandible. Majority of the maxillary teeth are absent. There is limited detail of the maxillary alveolar ridge, especially on the right side. Suspect caries of the left mandibular lateral incisor possibly associated with periapical abscess. There is limited detail regarding the central mandibular teeth.  IMPRESSION: 1. Limited detail. 2. Numerous absent teeth. 3. Suspect significant caries and possible periapical abscess of the left lateral mandibular incisor.   Electronically Signed   By: Nolon Nations M.D.   On: 07/10/2014 21:44   Mr Knee Right Wo Contrast  07/12/2014   CLINICAL DATA:  Recent acute onset of knee pain. Limited range of motion. No fluid aspirated on attempted arthrocentesis. Initial encounter.  EXAM: MRI OF THE RIGHT KNEE WITHOUT CONTRAST  TECHNIQUE: Multiplanar, multisequence MR imaging of the knee was performed. No intravenous contrast was administered.  COMPARISON:  None.  FINDINGS: MENISCI  Medial meniscus: There is extensive degenerative tearing throughout the medial meniscus, primarily within the posterior horn and body. The meniscus is largely extruded from the joint.  Lateral meniscus:  Diffuse degeneration without focal tear.  LIGAMENTS  Cruciates:  Intact.  There is mild ACL mucoid degeneration.  Collaterals:  Intact.  CARTILAGE  Patellofemoral: Moderate patellofemoral degenerative changes with chondral thinning and osteophytes. No focal subchondral  signal abnormality.  Medial: Advanced osteoarthritis with diffuse chondral thinning, osteophytes and subchondral eburnation.  Lateral: Moderate osteoarthritis with chondral thinning, surface irregularity and osteophytes.  Joint:  Minimal complex joint effusion.  Popliteal Fossa:  Unremarkable. No significant Baker's cyst.  Extensor Mechanism: Intact. There is moderate prepatellar subcutaneous edema without focal fluid collection. There is mild edema centrally in Hoffa's fat.  Bones: No significant extra-articular osseous findings. There is no cortical destruction or suspicious periarticular edema to suggest osteomyelitis.  IMPRESSION: 1. Tricompartmental osteoarthritis, most advanced within the medial compartment. 2. Small complex knee joint effusion. No specific signs of joint infection or osteomyelitis on noncontrast imaging. 3. Extensive degenerative tearing of the medial meniscus. 4. ACL mucoid degeneration.   Electronically Signed   By: Richardean Sale M.D.   On: 07/12/2014 08:41      Assessment/Plan:  Principal Problem:   Viridans streptococci infection Active Problems:   NSTEMI (non-ST elevated myocardial infarction)   Bacteremia   Atrial fibrillation   Chronic systolic CHF (congestive heart failure)   Right lower lobe pneumonia   Protein-calorie malnutrition, severe   Unintentional weight loss   Acute pain of right knee   Normocytic anemia   Hypertension   Coronary artery disease   Dyslipidemia   Acute and subacute infective endocarditis in diseases classified elsewhere   Knee pain, acute   Aortic regurgitation    Joe Kennedy is a 74 y.o. male with  Poor dentition who in fact has as recently as a year ago pulled his out teeth out when they "went bad" who resents with an approximately 4 month history of poor appetite and anorexia and 35 pound weight loss. This had developed after he developed knee pain in his right knee which had failed to improve. Ultimately he had a syncopal  episode in his doctor's office and was brought down [facility]. Blood cultures were done there  and have shown him to have viridans group streptococci in 2 out of 2 blood cultures. The penicillin MIC is 0.125. His TEE shows shaggy vegatations on Aortic Valve with severe AR. His panorex shows possible periapical abscess of the left lateral mandibular incisor.  He has a childhood history and young adult history of severe penicillin allergy with his face and throat swelling up. He does not recall being treated with penicillin or cephalosporin since then.  #1 Viridans group Streptoccal bacterermia and endocarditis:  --HE is growing Viridans on repeat cultures on 07/06/14 here, 07/07/14 are negative  --Continue vancomycin given severe penicillin allergy  --CT SURGERY have seen the patient and considering potential surgery for valve replacement, and CABG  --DENTAL CONSULT RE HIS LEFT LATERAL MANDIBULAR INCISOR POSSIBLE ABSCESS  --monitor for evidence of worsening HF, arrythmias, emboli evidence for need for urgent cardiac surgery  --he will NEED  4 weeks of IV vancomycin for his endocarditis with day #1 being date of first negative blood cultures  #2 knee pain: MRI shows severe OA and knee fluid was actually obtained with 233 WBC, no crystals an no growth to date  Joint is not clearly infected by fluid analysis, monitor  #3 35 pound weight loss likely due to subacute endocarditis but worry about other pathology    Dr. Johnnye Sima is covering and available for questions until I get back on Monday.    LOS: 6 days   Alcide Evener 07/12/2014, 8:13 PM

## 2014-07-12 NOTE — Evaluation (Signed)
Occupational Therapy Evaluation Patient Details Name: Joe Kennedy MRN: [id number] DOB: 10-12-1940 Today's Date: 07/12/2014    History of Present Illness 74 y.o. male admitted with Viridans group Streptoccal bacterermia and endocarditis.   Clinical Impression   PTA pt lived at home and had assistance from his wife for LB ADLs due to requiring increased time. Pt ambulated with crutches. Pt currently at min guard level for functional mobility and requires assist for LB ADLs. Pt is limited by generalized weakness and would benefit from acute OT to promote independence with ADLs and functional mobility. Recommend HHOT at d/c to further facilitate independence with family support.     Follow Up Recommendations  Home health OT;Supervision/Assistance - 24 hour    Equipment Recommendations  3 in 1 bedside comode    Recommendations for Other Services       Precautions / Restrictions Precautions Precautions: Fall Restrictions Weight Bearing Restrictions: No      Mobility Bed Mobility Overal bed mobility: Modified Independent             General bed mobility comments: Requires extra time  Transfers Overall transfer level: Needs assistance Equipment used: Rolling walker (2 wheeled) Transfers: Sit to/from Stand Sit to Stand: Min guard         General transfer comment: Close guard for safety. VC for hand placement. Relies heavily on Rw upon standing.    Balance Overall balance assessment: Needs assistance Sitting-balance support: No upper extremity supported;Feet supported Sitting balance-Leahy Scale: Good     Standing balance support: Bilateral upper extremity supported;During functional activity Standing balance-Leahy Scale: Poor                              ADL Overall ADL's : Needs assistance/impaired Eating/Feeding: Independent;Sitting   Grooming: Set up;Sitting   Upper Body Bathing: Set up;Sitting   Lower Body Bathing: Minimal assistance;Sit  to/from stand   Upper Body Dressing : Set up;Sitting   Lower Body Dressing: Minimal assistance;Sit to/from stand   Toilet Transfer: Scientist, product/process development Details (indicate cue type and reason): recliner to bed         Functional mobility during ADLs: Min guard;Rolling walker General ADL Comments: Pt had difficulty maneuvering RW into tight space and ended up falling back onto bed (with Supervision). Educated pt on safe RW use and sequencing for safe transfers.                Pertinent Vitals/Pain Pain Assessment: No/denies pain     Hand Dominance Right   Extremity/Trunk Assessment Upper Extremity Assessment Upper Extremity Assessment: Generalized weakness   Lower Extremity Assessment Lower Extremity Assessment: Generalized weakness   Cervical / Trunk Assessment Cervical / Trunk Assessment: Normal   Communication Communication Communication: No difficulties   Cognition Arousal/Alertness: Awake/alert Behavior During Therapy: WFL for tasks assessed/performed Overall Cognitive Status: Within Functional Limits for tasks assessed                                Home Living Family/patient expects to be discharged to:: Private residence Living Arrangements: Spouse/significant other Available Help at Discharge: Family;Available 24 hours/day Type of Home: House Home Access: Stairs to enter [employer] of Steps: 4 Entrance Stairs-Rails: Right;Left Home Layout: Other (Comment) (down 2 steps to enter living room- no rail)     Bathroom Shower/Tub: Tub/shower unit Shower/tub characteristics: Architectural technologist: Standard  Home Equipment: Crutches;Cane - single point          Prior Functioning/Environment Level of Independence: Needs assistance  Gait / Transfers Assistance Needed: pt ambulated with crutches ADL's / Homemaking Assistance Needed: Pt's wife assisted with LB dressing. Pt able to complete Mod I with  increased time but reported easier for his wife to assist.         OT Diagnosis: Generalized weakness   OT Problem List: Decreased strength;Decreased activity tolerance;Decreased range of motion;Impaired balance (sitting and/or standing);Decreased knowledge of use of DME or AE;Decreased knowledge of precautions   OT Treatment/Interventions: Self-care/ADL training;Therapeutic exercise;Energy conservation;DME and/or AE instruction;Therapeutic activities;Patient/family education;Balance training    OT Goals(Current goals can be found in the care plan section) Acute Rehab OT Goals Patient Stated Goal: to get back to moving and doing things on my own OT Goal Formulation: With patient Time For Goal Achievement: 07/26/14 Potential to Achieve Goals: Good ADL Goals Pt Will Perform Grooming: with supervision;standing Pt Will Perform Lower Body Bathing: with supervision;sit to/from stand Pt Will Perform Lower Body Dressing: with supervision;sit to/from stand Pt Will Transfer to Toilet: with supervision;ambulating Pt Will Perform Toileting - Clothing Manipulation and hygiene: with supervision;sit to/from stand Pt Will Perform Tub/Shower Transfer: with supervision;ambulating;3 in 1;rolling walker  OT Frequency: Min 2X/week    End of Session Equipment Utilized During Treatment: Gait belt;Rolling walker  Activity Tolerance: Patient tolerated treatment well Patient left: in bed;with call bell/phone within reach;with bed alarm set;with family/visitor present;with SCD's reapplied   Time: 1353-1407 OT Time Calculation (min): 14 min Charges:  OT General Charges $OT Visit: 1 Procedure OT Evaluation $Initial OT Evaluation Tier I: 1 Procedure G-Codes:    Juluis Rainier 2014/07/13, 2:18 PM   Cyndie Chime, OTR/L Occupational Therapist [phone] (pager)

## 2014-07-12 NOTE — Progress Notes (Signed)
[facility] PROGRESS NOTE  Joe Kennedy DVV:[id number] DOB: [birth year] DOA: 07/06/2014 PCP: No primary care provider on file.  Assessment/Plan: #1 viridans group streptococcal bacteremia and endocarditis Patient currently on IV vancomycin. Patient will need at least 4 weeks of IV vancomycin. Patient is currently afebrile. CBC within normal limits. Cardiology and ID following. We'll consulted cardiothoracic surgery for further evaluation of endocarditis and aortic valve and severe aortic valvular regurgitation. Also consult with Dr. Lawana Chambers of dentistry.  #2 right knee pain MRI of the right knee with tricompartmental osteoarthritis most advanced within the medial compartment. Small complex knee joint effusion no specific signs of joint infection osteomyelitis on noncontrast imaging. Extensive degenerative tearing of the medial meniscus. Anterior cruciate ligament mucoid degeneration. Pain management. Follow.  #3 severe aortic insufficiency/aortic valvular endocarditis Patient currently on IV antibiotics. Patient also noted to have a STEMI. Consult with cardiothoracic surgery for further evaluation and management. Patient may need a aVR/CABG. Patient is currently off anticoagulation secondary to recent GI bleed and is too high risk for colonoscopy.  #4 GI bleed likely diverticular in nature Patient has been seen by gastroenterology feel patient is too high risk for colonoscopy and physical patient likely has a diverticular bleed. Patient denies any further overt bleeding today. Anticoagulation on hold. Hemoglobin currently stable at 10.1. Follow H&H.  #5 non-STEMI in the setting of sepsis Patient had presented to [place] with syncope. Troponins were elevated at 11 the patient initially admitted under cardiology service. Patient denies any current chest pain. Troponin levels were 4.03, 3.46, 3.11. 2-D echo showed an EF of 55-60% with grade 2 diastolic dysfunction. Patient had EKG  changes with T-wave inversions in leads 2 in the lateral leads documented in [facility] H&P. At [facility] within the last 2 weeks were reviewed by cardiology and patient noted to have LAD moderate disease. Left circumflex has a tight stenosis but not a good candidate for CABG. Subtotal OM. RCA has distal disease or could potentially be stented. Continue current medical management. Heparin drip has been discontinued secondary to GI bleed. Aspirin is also been discontinued. CT surgery has been consulted. Cardiology following.  #6 chronic systolic heart failure Patient also noted to have severe aortic insufficiency. TEE with a EF of 60% severe aortic regurgitation, severe tricuspid regurgitation, vegetation on aortic valve. Continue current beta blocker. Follow.   #7 right lower lobe pneumonia Patient on IV antibiotics.  #8 atrial fibrillation Patient is currently rate controlled. Anticoagulation has been discontinued secondary to GI bleed. Follow.  #9 hypokalemia Repleted.  #10 prophylaxis SCDs for DVT prophylaxis.  Code Status: Full Family Communication: Updated patient and wife at bedside. Disposition Plan: Remain inpatient.   Consultants:  GI Dr. Benson Norway 07/11/2014  Cardiology: also cardiology service 07/06/2014  Infectious diseases Dr. Megan Salon  Procedures:  MRI right knee 07/11/2014  2-D echo 07/08/2014  TEE 07/11/2014  Panorex 07/10/2014  Chest x-ray 07/06/2014  Antibiotics:  IV vancomycin 07/06/2014  IV Levaquin 07/07/2014 >>> 07/10/2014    HPI/Subjective: Patient states she's feeling better than on admission.  Objective: Filed Vitals:   07/12/14 1355  BP: 130/45  Pulse: 82  Temp: 97.9 F (36.6 C)  Resp: 18    Intake/Output Summary (Last 24 hours) at 07/12/14 1841 Last data filed at 07/12/14 1620  Gross per 24 hour  Intake 2470.74 ml  Output    950 ml  Net 1520.74 ml   Filed Weights   07/10/14 0400  07/11/14 0457 07/12/14 0544  Weight: 84.823 kg (187 lb) 82.555 kg (182 lb) 85.73 kg (189 lb)    Exam:   General:  NAD  Cardiovascular: Irregularly irregular with 3/6 SEM  Respiratory: CTAB  Abdomen: Soft, nontender, nondistended, positive bowel sounds.  Musculoskeletal: No clubbing cyanosis or edema.  Data Reviewed: Basic Metabolic Panel:  Recent Labs Lab 07/06/14 1940 07/07/14 0519 07/08/14 0333 07/09/14 0458 07/10/14 0530 07/11/14 1143  NA  --  139 138 139 138 139  K  --  3.2* 3.4* 4.3 4.4 4.4  CL  --  110 110 112 109 111  CO2  --  25 23 20 22 25   GLUCOSE  --  81 88 88 91 91  BUN  --  20 22 17 13 15   CREATININE  --  1.31 1.25 1.24 1.28 1.35  CALCIUM  --  8.1* 8.0* 8.3* 8.2* 8.0*  MG 2.1  --   --   --   --   --    Liver Function Tests:  Recent Labs Lab 07/07/14 0519 07/08/14 0333 07/09/14 0458 07/10/14 0530  AST 31 37 35 33  ALT 21 25 26 24   ALKPHOS 64 66 72 69  BILITOT 0.6 0.8 0.5 0.6  PROT 5.3* 5.4* 5.9* 5.6*  ALBUMIN 1.9* 1.9* 2.1* 2.0*   No results for input(s): LIPASE, AMYLASE in the last 168 hours. No results for input(s): AMMONIA in the last 168 hours. CBC:  Recent Labs Lab 07/08/14 0333 07/09/14 0458 07/10/14 0530 07/11/14 1143 07/12/14 0402  WBC 7.9 7.9 7.7 8.1 9.1  HGB 8.6* 9.0* 8.8* 8.9* 10.1*  HCT 27.7* 29.0* 28.7* 28.6* 32.2*  MCV 78.2 78.4 79.5 81.0 80.7  PLT 280 306 303 282 264   Cardiac Enzymes:  Recent Labs Lab 07/06/14 1940 07/07/14 0005 07/07/14 0519  TROPONINI 4.03* 3.46* 3.11*   BNP (last 3 results)  Recent Labs  07/06/14 1940  BNP 779.9*    ProBNP (last 3 results) No results for input(s): PROBNP in the last 8760 hours.  CBG: No results for input(s): GLUCAP in the last 168 hours.  Recent Results (from the past 240 hour(s))  Culture, blood (routine x 2)     Status: None   Collection Time: 07/06/14  7:40 PM  Result Value Ref Range Status   Specimen Description BLOOD LEFT ANTECUBITAL  Final   Special  Requests BOTTLES DRAWN AEROBIC AND ANAEROBIC 3CC EA  Final   Culture   Final    VIRIDANS STREPTOCOCCUS Note: Gram Stain Report Called to,Read Back By and Verified With: DEVON RN ON 3W AT 1412 [id number] BY CASTC Performed at [facility]    Report Status 07/11/2014 FINAL  Final  Culture, blood (routine x 2)     Status: None (Preliminary result)   Collection Time: 07/06/14  7:50 PM  Result Value Ref Range Status   Specimen Description BLOOD LEFT HAND  Final   Special Requests BOTTLES DRAWN AEROBIC ONLY 2CC  Final   Culture   Final           BLOOD CULTURE RECEIVED NO GROWTH TO DATE CULTURE WILL BE HELD FOR 5 DAYS BEFORE ISSUING A FINAL NEGATIVE REPORT Performed at [facility]    Report Status PENDING  Incomplete  Culture, blood (routine x 2)     Status: None (Preliminary result)   Collection Time: 07/07/14 10:46 AM  Result Value Ref Range Status   Specimen Description BLOOD LEFT ARM  Final   Special Requests BOTTLES DRAWN AEROBIC AND ANAEROBIC  10CC EACH  Final   Culture   Final           BLOOD CULTURE RECEIVED NO GROWTH TO DATE CULTURE WILL BE HELD FOR 5 DAYS BEFORE ISSUING A FINAL NEGATIVE REPORT Performed at [facility]    Report Status PENDING  Incomplete  Culture, blood (routine x 2)     Status: None (Preliminary result)   Collection Time: 07/07/14 10:53 AM  Result Value Ref Range Status   Specimen Description BLOOD LEFT HAND  Final   Special Requests BOTTLES DRAWN AEROBIC AND ANAEROBIC 10CC EACH  Final   Culture   Final           BLOOD CULTURE RECEIVED NO GROWTH TO DATE CULTURE WILL BE HELD FOR 5 DAYS BEFORE ISSUING A FINAL NEGATIVE REPORT Performed at [facility]    Report Status PENDING  Incomplete  Clostridium Difficile by PCR     Status: None   Collection Time: 07/09/14 12:42 AM  Result Value Ref Range Status   C difficile by pcr NEGATIVE NEGATIVE Final  Body fluid culture     Status: None (Preliminary result)   Collection Time:  07/10/14  2:00 PM  Result Value Ref Range Status   Specimen Description FLUID KNEE RIGHT SYNOVIAL  Final   Special Requests NONE  Final   Gram Stain   Final    RARE WBC PRESENT,BOTH PMN AND MONONUCLEAR NO ORGANISMS SEEN Performed at [facility]    Culture   Final    NO GROWTH 1 DAY Performed at [facility]    Report Status PENDING  Incomplete     Studies: Dg Orthopantogram  07/10/2014   CLINICAL DATA:  Bacteremia. Infection in the mouth. Symptoms for 2 months.  EXAM: ORTHOPANTOGRAM/PANORAMIC  COMPARISON:  None.  FINDINGS: Positioning is nonstandard. No evidence for acute fracture of the mandible. Majority of the maxillary teeth are absent. There is limited detail of the maxillary alveolar ridge, especially on the right side. Suspect caries of the left mandibular lateral incisor possibly associated with periapical abscess. There is limited detail regarding the central mandibular teeth.  IMPRESSION: 1. Limited detail. 2. Numerous absent teeth. 3. Suspect significant caries and possible periapical abscess of the left lateral mandibular incisor.   Electronically Signed   By: Nolon Nations M.D.   On: 07/10/2014 21:44   Mr Knee Right Wo Contrast  07/12/2014   CLINICAL DATA:  Recent acute onset of knee pain. Limited range of motion. No fluid aspirated on attempted arthrocentesis. Initial encounter.  EXAM: MRI OF THE RIGHT KNEE WITHOUT CONTRAST  TECHNIQUE: Multiplanar, multisequence MR imaging of the knee was performed. No intravenous contrast was administered.  COMPARISON:  None.  FINDINGS: MENISCI  Medial meniscus: There is extensive degenerative tearing throughout the medial meniscus, primarily within the posterior horn and body. The meniscus is largely extruded from the joint.  Lateral meniscus:  Diffuse degeneration without focal tear.  LIGAMENTS  Cruciates:  Intact.  There is mild ACL mucoid degeneration.  Collaterals:  Intact.  CARTILAGE  Patellofemoral: Moderate patellofemoral  degenerative changes with chondral thinning and osteophytes. No focal subchondral signal abnormality.  Medial: Advanced osteoarthritis with diffuse chondral thinning, osteophytes and subchondral eburnation.  Lateral: Moderate osteoarthritis with chondral thinning, surface irregularity and osteophytes.  Joint:  Minimal complex joint effusion.  Popliteal Fossa:  Unremarkable. No significant Baker's cyst.  Extensor Mechanism: Intact. There is moderate prepatellar subcutaneous edema without focal fluid collection. There is mild edema centrally in Hoffa's  fat.  Bones: No significant extra-articular osseous findings. There is no cortical destruction or suspicious periarticular edema to suggest osteomyelitis.  IMPRESSION: 1. Tricompartmental osteoarthritis, most advanced within the medial compartment. 2. Small complex knee joint effusion. No specific signs of joint infection or osteomyelitis on noncontrast imaging. 3. Extensive degenerative tearing of the medial meniscus. 4. ACL mucoid degeneration.   Electronically Signed   By: Richardean Sale M.D.   On: 07/12/2014 08:41    Scheduled Meds: . amiodarone  200 mg Oral Daily  . atorvastatin  40 mg Oral QHS  . digoxin  125 mcg Oral Daily  . feeding supplement (ENSURE ENLIVE)  237 mL Oral BID BM  . metoprolol tartrate  25 mg Oral BID  . [START ON 07/15/2014] pantoprazole (PROTONIX) IV  40 mg Intravenous Q12H  . saccharomyces boulardii  250 mg Oral BID  . sodium chloride  3 mL Intravenous Q12H  . vancomycin  750 mg Intravenous Q12H   Continuous Infusions: . pantoprozole (PROTONIX) infusion 8 mg/hr (07/12/14 1149)    Principal Problem:   Viridans streptococci infection Active Problems:   Acute and subacute infective endocarditis in diseases classified elsewhere   NSTEMI (non-ST elevated myocardial infarction)   Bacteremia   Atrial fibrillation   Chronic systolic CHF (congestive heart failure)   Right lower lobe pneumonia   Protein-calorie malnutrition,  severe   Unintentional weight loss   Acute pain of right knee   Normocytic anemia   Hypertension   Coronary artery disease   Dyslipidemia   Knee pain, acute   Aortic regurgitation    Time spent: 40 mins    [facility] MD [facility] Pager [phone]. If 7PM-7AM, please contact night-coverage at [email or web address], password [facility] 07/12/2014, 6:41 PM  LOS: 6 days

## 2014-07-12 NOTE — Progress Notes (Addendum)
Briefly seen and examined. Significant diastolic murmur. Irregularly irregular. Poor dentition. Agree with CT surgery consultation given aortic valve endocarditis and severe AI. He may not be an ideal surgical candidate, but would be helpful to get their opinion. Target vessels for STEMI not felt be good targets for CABG - PCI could be considered, but not preferable with infective endocarditis. AVR/CABG seems to be the best option. Off anticoagulation due to GI bleeding - at this point, too high risk for colonoscopy - Dr. Benson Norway suspects diverticular bleed. Agree that he will also need a dental consultation - likely will need extraction.  Pixie Casino, MD, [facility] Attending Cardiologist [place]

## 2014-07-13 ENCOUNTER — Other Ambulatory Visit: Payer: Self-pay | Admitting: *Deleted

## 2014-07-13 ENCOUNTER — Inpatient Hospital Stay (HOSPITAL_COMMUNITY): Payer: Commercial Managed Care - HMO

## 2014-07-13 ENCOUNTER — Encounter (HOSPITAL_COMMUNITY): Payer: Self-pay | Admitting: Dentistry

## 2014-07-13 DIAGNOSIS — K089 Disorder of teeth and supporting structures, unspecified: Secondary | ICD-10-CM | POA: Insufficient documentation

## 2014-07-13 DIAGNOSIS — I358 Other nonrheumatic aortic valve disorders: Secondary | ICD-10-CM

## 2014-07-13 DIAGNOSIS — I351 Nonrheumatic aortic (valve) insufficiency: Secondary | ICD-10-CM | POA: Insufficient documentation

## 2014-07-13 DIAGNOSIS — M25561 Pain in right knee: Secondary | ICD-10-CM

## 2014-07-13 DIAGNOSIS — K088 Other specified disorders of teeth and supporting structures: Secondary | ICD-10-CM

## 2014-07-13 DIAGNOSIS — Z01810 Encounter for preprocedural cardiovascular examination: Secondary | ICD-10-CM

## 2014-07-13 LAB — BASIC METABOLIC PANEL
Anion gap: 8 (ref 5–15)
BUN: 13 mg/dL (ref 6–23)
CO2: 21 mmol/L (ref 19–32)
Calcium: 8.3 mg/dL — ABNORMAL LOW (ref 8.4–10.5)
Chloride: 112 mmol/L (ref 96–112)
Creatinine, Ser: 1.34 mg/dL (ref 0.50–1.35)
GFR calc non Af Amer: 51 mL/min — ABNORMAL LOW (ref >90)
GFR, EST AFRICAN AMERICAN: 59 mL/min — AB (ref >90)
Glucose, Bld: 100 mg/dL — ABNORMAL HIGH (ref 70–99)
POTASSIUM: 4.4 mmol/L (ref 3.5–5.1)
SODIUM: 141 mmol/L (ref 135–145)

## 2014-07-13 LAB — PULMONARY FUNCTION TEST
DL/VA % pred: 78 %
DL/VA: 3.67 ml/min/mmHg/L
DLCO unc % pred: 33 %
DLCO unc: 11.63 ml/min/mmHg
FEF 25-75 PRE: 1.52 L/s
FEF2575-%Pred-Pre: 62 %
FEV1-%PRED-PRE: 52 %
FEV1-Pre: 1.75 L
FEV1FVC-%PRED-PRE: 108 %
FEV6-%PRED-PRE: 51 %
FEV6-PRE: 2.21 L
FEV6FVC-%Pred-Pre: 106 %
FVC-%Pred-Pre: 48 %
FVC-Pre: 2.21 L
PRE FEV1/FVC RATIO: 79 %
Pre FEV6/FVC Ratio: 100 %
RV % PRED: 77 %
RV: 2.07 L
TLC % PRED: 54 %
TLC: 4.03 L

## 2014-07-13 LAB — CULTURE, BLOOD (ROUTINE X 2)
CULTURE: NO GROWTH
CULTURE: NO GROWTH
Culture: NO GROWTH

## 2014-07-13 LAB — TROPONIN I
TROPONIN I: 0.71 ng/mL — AB (ref <0.031)
Troponin I: 0.77 ng/mL (ref <0.031)
Troponin I: 0.71 ng/mL (ref <0.031)

## 2014-07-13 LAB — CBC
HCT: 31.9 % — ABNORMAL LOW (ref 39.0–52.0)
Hemoglobin: 9.9 g/dL — ABNORMAL LOW (ref 13.0–17.0)
MCH: 25.3 pg — ABNORMAL LOW (ref 26.0–34.0)
MCHC: 31 g/dL (ref 30.0–36.0)
MCV: 81.4 fL (ref 78.0–100.0)
Platelets: 240 10*3/uL (ref 150–400)
RBC: 3.92 MIL/uL — ABNORMAL LOW (ref 4.22–5.81)
RDW: 18.8 % — AB (ref 11.5–15.5)
WBC: 8.7 10*3/uL (ref 4.0–10.5)

## 2014-07-13 LAB — MAGNESIUM: MAGNESIUM: 1.9 mg/dL (ref 1.5–2.5)

## 2014-07-13 MED ORDER — FUROSEMIDE 40 MG PO TABS
40.0000 mg | ORAL_TABLET | Freq: Every day | ORAL | Status: DC
  Administered 2014-07-13 – 2014-07-15 (×3): 40 mg via ORAL
  Filled 2014-07-13 (×3): qty 1

## 2014-07-13 MED ORDER — ALBUTEROL SULFATE (2.5 MG/3ML) 0.083% IN NEBU
2.5000 mg | INHALATION_SOLUTION | Freq: Once | RESPIRATORY_TRACT | Status: AC
  Administered 2014-07-13: 2.5 mg via RESPIRATORY_TRACT

## 2014-07-13 MED ORDER — PANTOPRAZOLE SODIUM 40 MG PO TBEC
40.0000 mg | DELAYED_RELEASE_TABLET | Freq: Once | ORAL | Status: AC
Start: 2014-07-13 — End: 2014-07-13
  Administered 2014-07-13: 40 mg via ORAL
  Filled 2014-07-13: qty 1

## 2014-07-13 MED ORDER — GI COCKTAIL ~~LOC~~
30.0000 mL | Freq: Once | ORAL | Status: AC
  Administered 2014-07-13: 30 mL via ORAL
  Filled 2014-07-13: qty 30

## 2014-07-13 NOTE — Progress Notes (Addendum)
Pt Profile: 74 y.o. male with a history of atrial fibrillation on Eliquis, CAD, HLD, chronic systolic heart failure (EF 35%), chronic anemia, and HTN who presented to [facility] with a syncopal episode on 07/04/2014. He eventually ruled in for NSTEMI and was transferred to [facility] today for evaluation by [facility].  He is followed by Dr. Clayborn Bigness for cardiology. He was hospitalized earlier in March with angina, NSTEMI and had a cardiac catheterization, which showed two-vessel disease not amenable to intervention. Medical management was recommended. He was sent home on medical therapy.   Now admitted with syncope and bacteremia   Subjective: No chest pain. Mild dyspnea. Up 3L since admit.  Objective: Vital signs in last 24 hours: Temp:  [97.9 F (36.6 C)-98.5 F (36.9 C)] 98.2 F (36.8 C) (04/07 0456) Pulse Rate:  [68-82] 71 (04/07 0456) Resp:  [18] 18 (04/06 1355) BP: (130-151)/(36-45) 149/36 mmHg (04/07 0456) SpO2:  [93 %-99 %] 97 % (04/07 0456) Weight:  [186 lb 8 oz (84.596 kg)] 186 lb 8 oz (84.596 kg) (04/07 0456) Weight change: -2 lb 8 oz (-1.134 kg) Last BM Date: 07/12/14 Intake/Output from previous day: 04/06 0701 - 04/07 0700 In: 2412.7 [P.O.:720; I.V.:942.7; IV Piggyback:750] Out: 950 [Urine:950] Intake/Output this shift: Total I/O In: 240 [P.O.:240] Out: -   PE: General:Pleasant affect, NAD, though appears ill Skin:Warm and dry, brisk capillary refill HEENT:normocephalic, sclera clear, mucus membranes moist Heart:S1S2 RRR with 0-2/6 systolic murmur, no gallup, rub or click Lungs:clear without rales, rhonchi, or wheezes VZC:HYIF, non tender, + BS, do not palpate liver spleen or masses Ext:no lower ext edema, 2+ pedal pulses, 2+ radial pulses Neuro:alert and oriented, MAE, follows commands, + facial symmetry Tele:  SR with PVCs on occ.    Lab Results:  Recent Labs  07/12/14 0402 07/13/14 0415  WBC 9.1 8.7  HGB 10.1* 9.9*  HCT 32.2* 31.9*  PLT 264  240   BMET  Recent Labs  07/11/14 1143 07/13/14 0415  NA 139 141  K 4.4 4.4  CL 111 112  CO2 25 21  GLUCOSE 91 100*  BUN 15 13  CREATININE 1.35 1.34  CALCIUM 8.0* 8.3*    Recent Labs  07/13/14 0415  TROPONINI 0.77*    Lab Results  Component Value Date   CHOL 82 07/07/2014   HDL 18* 07/07/2014   LDLCALC 43 07/07/2014   TRIG 103 07/07/2014   CHOLHDL 4.6 07/07/2014   Lab Results  Component Value Date   HGBA1C 5.9* 07/06/2014     Lab Results  Component Value Date   TSH 2.343 07/06/2014    Hepatic Function Panel No results for input(s): PROT, ALBUMIN, AST, ALT, ALKPHOS, BILITOT, BILIDIR, IBILI in the last 72 hours. No results for input(s): CHOL in the last 72 hours. No results for input(s): PROTIME in the last 72 hours.     Studies/Results: ECHO Left ventricle: The cavity size was mildly dilated. There was mild concentric hypertrophy. Systolic function was normal. The estimated ejection fraction was in the range of 50% to 55%. Features are consistent with a pseudonormal left ventricular filling pattern, with concomitant abnormal relaxation and increased filling pressure (grade 2 diastolic dysfunction). - Regional wall motion abnormality: Mild hypokinesis of the mid-apical anterior, mid anterolateral, apical lateral, and apical myocardium. - Aortic valve: Trileaflet; mildly thickened, mildly calcified leaflets. There was no stenosis. There was moderate to severe regurgitation. - Mitral valve: Mildly thickened leaflets . There was mild regurgitation. - Left atrium: The  atrium was mildly dilated. Volume/bsa, ES, (1-plane Simpson&'s, A2C): 30.1 ml/m^2. - Right ventricle: Systolic function was mildly reduced. - Right atrium: The atrium was mildly dilated. - Tricuspid valve: There was moderate-severe regurgitation. - Pulmonary arteries: PA peak pressure: 69 mm Hg (S). Severely elevated pulmonary pressures. - Inferior vena cava: The  vessel was dilated. The respirophasic diameter changes were blunted (< 50%), consistent with elevated central venous pressure.  Medications: I have reviewed the patient's current medications. Scheduled Meds: . amiodarone  200 mg Oral Daily  . atorvastatin  40 mg Oral QHS  . digoxin  125 mcg Oral Daily  . feeding supplement (ENSURE ENLIVE)  237 mL Oral BID BM  . metoprolol tartrate  25 mg Oral BID  . [START ON 07/15/2014] pantoprazole (PROTONIX) IV  40 mg Intravenous Q12H  . saccharomyces boulardii  250 mg Oral BID  . sodium chloride  3 mL Intravenous Q12H  . vancomycin  750 mg Intravenous Q12H   Continuous Infusions: . pantoprozole (PROTONIX) infusion 8 mg/hr (07/12/14 2224)   PRN Meds:.sodium chloride, acetaminophen, calcium carbonate, nitroGLYCERIN, ondansetron (ZOFRAN) IV, sodium chloride  Assessment/Plan: Principal Problem:   Viridans streptococci infection- plan for TEE for 07/11/14 OK per cards for diet today on Levaquin  Active Problems:   NSTEMI (non-ST elevated myocardial infarction)  Pk troponin prior to transfer 11-  Dr. Clayborn Bigness recommended CABG  medical management deep t wave inversions lat. On IV Heparin --reviewed by Drs Angelena Form and Nahser, their evaluation: The LAD has moderate disease but is not seen very well ijn all views. The LCx has a tight stenosis but is not a good target for CABG - it primarily fills a small , subtotalled OM ( that already has collateral flow). The RCA has distal disease and could potentially be stented  -on BB, no ACE due to AKI at [facility] now with Cr 1.28     Bacteremia    Atrial fibrillation- maintaining SR here was on eliquis and on amiodarone On IV heparin and dig. CHAD2S2VASc score 4- heparin on hold for possible diverticular bleed.    Chronic systolic CHF (congestive heart failure) EF 35%-- now with recent Echo EF 50-55% with G2DD moderate to severe AI, mild MR mod to severe TR and PA pk pressure 69 mmHg. Will add back maintenance  diuretic.    Right lower lobe pneumonia per IM, ID   Protein-calorie malnutrition, severe per IM   Unintentional weight loss per IM   Acute pain of right knee- for aspiration today    Normocytic anemia- chronic H/H 8.8/28.7-per IM   Hypertension- BP 130/34 per IM   Coronary artery disease- see above    Dyslipidemia    LOS: 7 days   Time spent with pt. :20 minutes. Pixie Casino  Nurse Practitioner Certified Pager [phone] or after 5pm and on weekends call [phone] 07/13/2014, 10:35 AM

## 2014-07-13 NOTE — Progress Notes (Signed)
Patient complained of 8/10 anterior, center, dull, chest pain.  Vital signs stable, EKG obtained.  Per patient pain started with abdominal pain and traveled to center of chest.  Per patient also felt warm at this time.  RN administered one nitro sublingual tablet per prn orders and all pain and feeling of warmth resolved.  [facility] paged to update.  Order entered per Tylene Fantasia on call for [facility].

## 2014-07-13 NOTE — Progress Notes (Addendum)
INFECTIOUS DISEASE PROGRESS NOTE  ID: Joe Kennedy is a 74 y.o. male with  Principal Problem:   Viridans streptococci infection Active Problems:   NSTEMI (non-ST elevated myocardial infarction)   Bacteremia   Atrial fibrillation   Chronic systolic CHF (congestive heart failure)   Right lower lobe pneumonia   Protein-calorie malnutrition, severe   Unintentional weight loss   Acute pain of right knee   Normocytic anemia   Hypertension   Coronary artery disease   Dyslipidemia   Acute and subacute infective endocarditis in diseases classified elsewhere   Knee pain, acute   Aortic regurgitation  Subjective: No complaints, in good spirits.   Abtx:  Anti-infectives    Start     Dose/Rate Route Frequency Ordered Stop   07/10/14 1200  levofloxacin (LEVAQUIN) IVPB 750 mg     750 mg 100 mL/hr over 90 Minutes Intravenous Every 24 hours 07/10/14 0708 07/10/14 1314   07/08/14 1200  levofloxacin (LEVAQUIN) IVPB 750 mg  Status:  Discontinued     750 mg 100 mL/hr over 90 Minutes Intravenous Every 24 hours 07/08/14 1124 07/10/14 0708   07/07/14 1000  levofloxacin (LEVAQUIN) tablet 750 mg  Status:  Discontinued     750 mg Oral Daily 07/06/14 1656 07/06/14 1734   07/07/14 1000  levofloxacin (LEVAQUIN) tablet 750 mg  Status:  Discontinued     750 mg Oral Every 48 hours 07/06/14 1734 07/07/14 0954   07/07/14 1000  levofloxacin (LEVAQUIN) tablet 750 mg  Status:  Discontinued     750 mg Oral Daily 07/07/14 0954 07/08/14 1124   07/06/14 2200  vancomycin (VANCOCIN) IVPB 750 mg/150 ml premix     750 mg 150 mL/hr over 60 Minutes Intravenous Every 12 hours 07/06/14 1734        Medications:  Scheduled: . amiodarone  200 mg Oral Daily  . atorvastatin  40 mg Oral QHS  . digoxin  125 mcg Oral Daily  . feeding supplement (ENSURE ENLIVE)  237 mL Oral BID BM  . furosemide  40 mg Oral Daily  . metoprolol tartrate  25 mg Oral BID  . [START ON 07/15/2014] pantoprazole (PROTONIX) IV  40 mg  Intravenous Q12H  . saccharomyces boulardii  250 mg Oral BID  . sodium chloride  3 mL Intravenous Q12H  . vancomycin  750 mg Intravenous Q12H    Objective: Vital signs in last 24 hours: Temp:  [97.9 F (36.6 C)-98.5 F (36.9 C)] 98.2 F (36.8 C) (04/07 0456) Pulse Rate:  [68-82] 71 (04/07 0456) Resp:  [18] 18 (04/06 1355) BP: (130-151)/(36-45) 149/36 mmHg (04/07 0456) SpO2:  [93 %-99 %] 97 % (04/07 0456) Weight:  [84.596 kg (186 lb 8 oz)] 84.596 kg (186 lb 8 oz) (04/07 0456)   General appearance: alert, cooperative and no distress Throat: abnormal findings: dentition: poor Resp: clear to auscultation bilaterally Cardio: regular rate and rhythm GI: normal findings: bowel sounds normal and soft, non-tender  Lab Results  Recent Labs  07/11/14 1143 07/12/14 0402 07/13/14 0415  WBC 8.1 9.1 8.7  HGB 8.9* 10.1* 9.9*  HCT 28.6* 32.2* 31.9*  NA 139  --  141  K 4.4  --  4.4  CL 111  --  112  CO2 25  --  21  BUN 15  --  13  CREATININE 1.35  --  1.34   Liver Panel No results for input(s): PROT, ALBUMIN, AST, ALT, ALKPHOS, BILITOT, BILIDIR, IBILI in the last 72 hours. Sedimentation Rate No  results for input(s): ESRSEDRATE in the last 72 hours. C-Reactive Protein No results for input(s): CRP in the last 72 hours.  Microbiology: Recent Results (from the past 240 hour(s))  Culture, blood (routine x 2)     Status: None   Collection Time: 07/06/14  7:40 PM  Result Value Ref Range Status   Specimen Description BLOOD LEFT ANTECUBITAL  Final   Special Requests BOTTLES DRAWN AEROBIC AND ANAEROBIC 3CC EA  Final   Culture   Final    VIRIDANS STREPTOCOCCUS Note: Gram Stain Report Called to,Read Back By and Verified With: DEVON RN ON 3W AT 1412 [id number] BY CASTC Performed at [facility]    Report Status 07/11/2014 FINAL  Final  Culture, blood (routine x 2)     Status: None   Collection Time: 07/06/14  7:50 PM  Result Value Ref Range Status   Specimen Description BLOOD  LEFT HAND  Final   Special Requests BOTTLES DRAWN AEROBIC ONLY 2CC  Final   Culture   Final    NO GROWTH 5 DAYS Performed at [facility]    Report Status 07/13/2014 FINAL  Final  Culture, blood (routine x 2)     Status: None   Collection Time: 07/07/14 10:46 AM  Result Value Ref Range Status   Specimen Description BLOOD LEFT ARM  Final   Special Requests BOTTLES DRAWN AEROBIC AND ANAEROBIC 10CC EACH  Final   Culture   Final    NO GROWTH 5 DAYS Performed at [facility]    Report Status 07/13/2014 FINAL  Final  Culture, blood (routine x 2)     Status: None   Collection Time: 07/07/14 10:53 AM  Result Value Ref Range Status   Specimen Description BLOOD LEFT HAND  Final   Special Requests BOTTLES DRAWN AEROBIC AND ANAEROBIC 10CC EACH  Final   Culture   Final    NO GROWTH 5 DAYS Performed at [facility]    Report Status 07/13/2014 FINAL  Final  Clostridium Difficile by PCR     Status: None   Collection Time: 07/09/14 12:42 AM  Result Value Ref Range Status   C difficile by pcr NEGATIVE NEGATIVE Final  Body fluid culture     Status: None (Preliminary result)   Collection Time: 07/10/14  2:00 PM  Result Value Ref Range Status   Specimen Description FLUID KNEE RIGHT SYNOVIAL  Final   Special Requests NONE  Final   Gram Stain   Final    RARE WBC PRESENT,BOTH PMN AND MONONUCLEAR NO ORGANISMS SEEN Performed at [facility]    Culture   Final    NO GROWTH 1 DAY Performed at [facility]    Report Status PENDING  Incomplete    Studies/Results: Mr Knee Right Wo Contrast  07/12/2014   CLINICAL DATA:  Recent acute onset of knee pain. Limited range of motion. No fluid aspirated on attempted arthrocentesis. Initial encounter.  EXAM: MRI OF THE RIGHT KNEE WITHOUT CONTRAST  TECHNIQUE: Multiplanar, multisequence MR imaging of the knee was performed. No intravenous contrast was administered.  COMPARISON:  None.  FINDINGS: MENISCI  Medial meniscus:  There is extensive degenerative tearing throughout the medial meniscus, primarily within the posterior horn and body. The meniscus is largely extruded from the joint.  Lateral meniscus:  Diffuse degeneration without focal tear.  LIGAMENTS  Cruciates:  Intact.  There is mild ACL mucoid degeneration.  Collaterals:  Intact.  CARTILAGE  Patellofemoral: Moderate patellofemoral degenerative changes  with chondral thinning and osteophytes. No focal subchondral signal abnormality.  Medial: Advanced osteoarthritis with diffuse chondral thinning, osteophytes and subchondral eburnation.  Lateral: Moderate osteoarthritis with chondral thinning, surface irregularity and osteophytes.  Joint:  Minimal complex joint effusion.  Popliteal Fossa:  Unremarkable. No significant Baker's cyst.  Extensor Mechanism: Intact. There is moderate prepatellar subcutaneous edema without focal fluid collection. There is mild edema centrally in Hoffa's fat.  Bones: No significant extra-articular osseous findings. There is no cortical destruction or suspicious periarticular edema to suggest osteomyelitis.  IMPRESSION: 1. Tricompartmental osteoarthritis, most advanced within the medial compartment. 2. Small complex knee joint effusion. No specific signs of joint infection or osteomyelitis on noncontrast imaging. 3. Extensive degenerative tearing of the medial meniscus. 4. ACL mucoid degeneration.   Electronically Signed   By: Richardean Sale M.D.   On: 07/12/2014 08:41     Assessment/Plan: Viridans bacteremia, endocarditis Ao valve EF 60% Poor dentition Severe protein calorie malnutrition (alb 2)  Total days of antibiotics: 8 vancomycin  Per pt his cardiac surgery will be after he completes anbx.  He will also need dental extractions prior as well.  Plan for vanco to continue to 4-29 Can f/u in ID clinic in next 2-3 weeks.  Available as needed.           Bobby Rumpf Infectious Diseases (pager)  [phone] [email or web address] 07/13/2014, 12:01 PM  LOS: 7 days

## 2014-07-13 NOTE — Consult Note (Signed)
   [facility] CM Inpatient Consult   07/13/2014  Joe Kennedy 1940/04/09 [id number]   Follow up visit with patient regarding [place] [facility] services. Both wife and patient indicate they will have follow up from [facility] post hospital discharge. State they are not interested at this time having calls from both agencies. They were provided [facility] Care Management contact information from previous visit to contact if needed. Will make inpatient RNCM aware.  Marthenia Rolling, MSN-Ed, RN,BSN [facility] Liaison [phone]

## 2014-07-13 NOTE — Consult Note (Signed)
DENTAL CONSULTATION  Date of Consultation:  07/13/2014   Patient Name:   Joe Kennedy Date of Birth:   07/23/1940 Medical Record Number: [id number]  VITALS: BP 149/36 mmHg  Pulse 71  Temp(Src) 98.2 F (36.8 C) (Oral)  Resp 18  Ht 6' (1.829 m)  Wt 186 lb 8 oz (84.596 kg)  BMI 25.29 kg/m2  SpO2 97%  CHIEF COMPLAINT: Patient referred by Dr. Grandville Silos for evaluation of poor dentition.  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 Patient with anticipated heart valve replacement and coronary artery bypass graft procedure. Patient is now seen as part of a medically necessary pre-heart valve surgery dental protocol examination to rule out dental infection that may further affect the patient's systemic health and anticipated heart valve surgery.  The patient currently denies acute toothaches, swellings, or abscesses. Patient indicates that he has not been seen by a dentist for the past 25-30 years. Patient indicates that he pulled multiple teeth by himself for the past 2-3 years. Patient has a maxillary cast partial denture that he " glues in".  Patient indicates that it "fits pretty good that way".  PROBLEM LIST: Patient Active Problem List   Diagnosis Date Noted  . Knee pain, acute   . Aortic regurgitation   . Acute and subacute infective endocarditis in diseases classified elsewhere   . Protein-calorie malnutrition, severe 07/08/2014  . Viridans streptococci infection 07/08/2014  . Unintentional weight loss 07/08/2014  . Acute pain of right knee 07/08/2014  . Normocytic anemia 07/08/2014  . Hypertension 07/08/2014  . Coronary artery disease 07/08/2014  . Dyslipidemia 07/08/2014  . NSTEMI (non-ST elevated myocardial infarction) 07/06/2014  . Bacteremia 07/06/2014  . Atrial fibrillation 07/06/2014  . Chronic systolic CHF (congestive heart failure) 07/06/2014  . Right lower lobe pneumonia 07/06/2014     PMH: Past Medical History  Diagnosis Date  . Hypertension   . Myocardial infarction   . CHF (congestive heart failure)   . Shortness of breath dyspnea   . Arthritis     KNEES    PSH: Past Surgical History  Procedure Laterality Date  . Tee without cardioversion N/A 07/11/2014    Procedure: TRANSESOPHAGEAL ECHOCARDIOGRAM (TEE);  Surgeon: Josue Hector, MD;  Location: [facility] ENDOSCOPY;  Service: Cardiovascular;  Laterality: N/A;    ALLERGIES: Allergies  Allergen Reactions  . Penicillins     Unable to say what kind of reaction    MEDICATIONS: Current Facility-Administered Medications  Medication Dose Route Frequency Provider Last Rate Last Dose  . 0.9 %  sodium chloride infusion  250 mL Intravenous PRN Eileen Stanford, PA-C 10 mL/hr at 07/08/14 0747 250 mL at 07/08/14 0747  . acetaminophen (TYLENOL) tablet 650 mg  650 mg Oral Q4H PRN Eileen Stanford, PA-C      . amiodarone (PACERONE) tablet 200 mg  200 mg Oral Daily Eileen Stanford, PA-C   200 mg at 07/12/14 2229  . atorvastatin (LIPITOR) tablet 40 mg  40 mg Oral QHS Eileen Stanford, PA-C   40 mg at 07/12/14 2203  . calcium carbonate (TUMS - dosed in mg elemental calcium) chewable tablet 200 mg of elemental calcium  1 tablet Oral TID PRN Reyne Dumas, MD   200 mg of elemental calcium at 07/11/14 0231  . digoxin (LANOXIN) tablet 125 mcg  125 mcg Oral Daily Eileen Stanford, PA-C   125 mcg at 07/12/14 7989  . feeding supplement (ENSURE ENLIVE) (  ENSURE ENLIVE) liquid 237 mL  237 mL Oral BID BM Thayer Headings, MD   237 mL at 07/12/14 1545  . metoprolol tartrate (LOPRESSOR) tablet 25 mg  25 mg Oral BID Eileen Stanford, PA-C   25 mg at 07/12/14 2203  . nitroGLYCERIN (NITROSTAT) SL tablet 0.4 mg  0.4 mg Sublingual Q5 Min x 3 PRN Eileen Stanford, PA-C   0.4 mg at 07/13/14 4696  . ondansetron (ZOFRAN) injection 4 mg  4 mg Intravenous Q6H PRN Eileen Stanford, PA-C   4 mg at 07/09/14 0846  . pantoprazole (PROTONIX) 80  mg in sodium chloride 0.9 % 250 mL (0.32 mg/mL) infusion  8 mg/hr Intravenous Continuous Reyne Dumas, MD 25 mL/hr at 07/12/14 2224 8 mg/hr at 07/12/14 2224  . [START ON 07/15/2014] pantoprazole (PROTONIX) injection 40 mg  40 mg Intravenous Q12H Reyne Dumas, MD      . saccharomyces boulardii (FLORASTOR) capsule 250 mg  250 mg Oral BID Reyne Dumas, MD   250 mg at 07/12/14 2203  . sodium chloride 0.9 % injection 3 mL  3 mL Intravenous Q12H Eileen Stanford, PA-C   3 mL at 07/12/14 2200  . sodium chloride 0.9 % injection 3 mL  3 mL Intravenous PRN Eileen Stanford, PA-C      . vancomycin (VANCOCIN) IVPB 750 mg/150 ml premix  750 mg Intravenous Q12H Lyndee Leo, RPH   750 mg at 07/12/14 2202    LABS: Lab Results  Component Value Date   WBC 8.7 07/13/2014   HGB 9.9* 07/13/2014   HCT 31.9* 07/13/2014   MCV 81.4 07/13/2014   PLT 240 07/13/2014      Component Value Date/Time   NA 141 07/13/2014 0415   K 4.4 07/13/2014 0415   CL 112 07/13/2014 0415   CO2 21 07/13/2014 0415   GLUCOSE 100* 07/13/2014 0415   BUN 13 07/13/2014 0415   CREATININE 1.34 07/13/2014 0415   CALCIUM 8.3* 07/13/2014 0415   GFRNONAA 51* 07/13/2014 0415   GFRAA 59* 07/13/2014 0415   Lab Results  Component Value Date   INR 1.21 07/07/2014   INR 1.22 07/06/2014   No results found for: PTT  SOCIAL HISTORY: History   Social History  . Marital Status: Married    Spouse Name: N/A  . Number of Children: N/A  . Years of Education: N/A   Occupational History  . Not on file.   Social History Main Topics  . Smoking status: Never Smoker   . Smokeless tobacco: Never Used  . Alcohol Use: 0.6 oz/week    1 Cans of beer per week  . Drug Use: No  . Sexual Activity: Not on file   Other Topics Concern  . Not on file   Social History Narrative    FAMILY HISTORY: Family History  Problem Relation Age of Onset  . Coronary artery disease Brother     REVIEW OF SYSTEMS: Reviewed from chart for this  admission.  DENTAL HISTORY: CHIEF COMPLAINT: Patient referred by Dr. Grandville Silos for evaluation of poor dentition.  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 Patient with anticipated heart valve replacement and coronary artery bypass graft procedure. Patient is now seen as part of a medically necessary pre-heart valve surgery dental protocol examination to rule out dental infection that may further affect the patient's systemic health and anticipated heart valve surgery.  The patient currently denies acute toothaches, swellings,  or abscesses. Patient indicates that he has not been seen by a dentist for the past 25-30 years. Patient indicates that he pulled multiple teeth by himself for the past 2-3 years. Patient has a maxillary cast partial denture that he " glues in".  Patient indicates that it "fits pretty good that way".   DENTAL EXAMINATION: GENERAL: The patient is a well-developed, well-nourished male in no acute distress. HEAD AND NECK: I am unable to palpate any submandibular lymphadenopathy. The patient denies acute TMJ symptoms. Patient has a maximum interincisal opening of 40 mm plus. INTRAORAL EXAM: Patient has normal saliva. Patient appears to have some lateral exostoses involving the mandibular arch. Patient has trauma to his upper lip along the midline associated with rubbing from the remaining maxillary anterior tooth in this area. Patient indicates that its only been that way for the past 2-3 days. The patient's lips are dry and chapped. DENTITION: Patient with multiple missing teeth. Patient with multiple retained root segments. The patient has a full bony impacted tooth #32. PERIODONTAL: Patient with chronic advanced periodontal disease with plaque and calculus accumulations, generalized gingival recession, and generalized tooth mobility of remaining teeth. DENTAL CARIES/SUBOPTIMAL RESTORATIONS: There are  multiple dental caries affecting the remaining dentition. ENDODONTIC: Patient currently denies acute pulpitis symptoms. Periapical pathology is associated with the retained roots. CROWN AND BRIDGE: There are no crown or bridge restorations PROSTHODONTIC: Patient has a maxillary cast partial denture that is ill fitting. Patient denies having a lower partial denture at this time. OCCLUSION: Patient has a poor occlusal scheme secondary to multiple missing teeth, multiple retained root segments, and lack of replacement of missing teeth with clinically acceptable dental prostheses.  RADIOGRAPHIC INTERPRETATION: A suboptimal orthopantogram was taken on 07/10/2014. There are multiple missing teeth. There multiple retained root segments. There is a full bony impacted #32. There is moderate to severe bone loss. There appears to be periapical radiolucencies associated with multiple retained roots. Dental caries are noted. Multiple diastemas are noted. There is supra-eruption and drifting of the unopposed teeth into the edentulous areas. There is atrophy of the edentulous alveolar ridges.   ASSESSMENTS: 1. Streptococcal viridans endocarditis 2. Heart valve disease with anticipated heart valve replacement 3. Coronary artery disease with anticipated coronary artery bypass graft procedure 4. Chronic apical periodontitis 5. Multiple retained root segments 6. Multiple dental caries 7. Chronic periodontitis with bone loss 8. Generalized interval recession 9. Generalized tooth mobility 10. Accretions 11. Mandibular lateral exostoses with questionable need for reduction. 12. Multiple missing teeth 13. Full bony impacted #32 14. Cardiovascular compromise with the risk for complications up to and including death with anticipated dental extractions in the operating room with general anesthesia.   PLAN/RECOMMENDATIONS: 1. I discussed the risks, benefits, and complications of various treatment options with the  patient in relationship to the medical and dental conditions, streptococcal viridans endocarditis, and anticipated heart valve surgery. We discussed various treatment options to include no treatment, multiple extractions with alveoloplasty, pre-prosthetic surgery as indicated, periodontal therapy, dental restorations, root canal therapy, crown and bridge therapy, implant therapy, and replacement of missing teeth as indicated. The patient currently wishes to proceed with extraction of multiple teeth with alveoloplasty and pre-prosthetic surgery as indicated in the operating room with general anesthesia. Tooth #32 will not be extracted at this time due to the complexity of that dental extraction requiring an oral surgeon. The patient will then need to follow the dentist of his choice for fabrication of dentures as indicated after adequate healing and once  medically stable from the anticipated heart valve surgery. The patient will be scheduled for heart surgery with Dr. Cyndia Bent per his discretion.   2. Discussion of findings with medical team and coordination of future medical and dental care as needed.    Lenn Cal, DDS

## 2014-07-13 NOTE — Progress Notes (Signed)
NUTRITION FOLLOW-UP / CONSULT  DOCUMENTATION CODES Per approved criteria  -Severe malnutrition in the context of acute illness or injury   Pt meets criteria for severe MALNUTRITION in the context of acute illness as evidenced by 13% wt loss x 3 months, moderate fat and muscle depletion.  INTERVENTION:  Continue Ensure Enlive po BID, each supplement provides 350 kcal and 20 grams of protein  NUTRITION DIAGNOSIS: Inadequate oral intake related to decreased appetite as evidenced by 13% wt loss x 3 months, moderate fat and muscle depletion. Ongoing.  Goal: Pt will meet >90% of estimated nutritional needs. Progressing.  Monitor:  PO/supplement intake, labs, weight trends  ASSESSMENT: Pt admitted from [facility] with dx of NSTEMI.  Received consult for assessment of nutrition status. Patient has been diagnosed with severe PCM in the context of acute illness as evidenced by 13% weight loss x 3 months and moderate fat and muscle depletion.  Patient has been on clear liquids x 2 days, advancing to full liquids today. Also receiving Ensure Enlive supplement BID between meals. He consumed 25% of clear liquid breakfast this morning. He is requesting solid foods today. C/o ongoing poor appetite. Is also drinking Ensure supplements once or twice daily, but is not willing to drink any more than two per day.    Labs reviewed.   Height: Ht Readings from Last 1 Encounters:  07/13/14 6' (1.829 m)    Weight: Wt Readings from Last 1 Encounters:  07/13/14 186 lb 8 oz (84.596 kg)   07/07/14 182 lb 3.2 oz (82.645 kg)        BMI:  Body mass index is 25.29 kg/(m^2).    Estimated Nutritional Needs: Kcal: 1850-2050 Protein: 90-100 grams Fluid: 1.9-2.1 L  Skin: Intact  Diet Order: Diet full liquid Room service appropriate?: Yes; Fluid consistency:: Thin    Intake/Output Summary (Last 24 hours) at 07/13/14 1002 Last data filed at 07/13/14 0905  Gross per 24 hour  Intake   1060 ml  Output     950 ml  Net    110 ml    Last BM: 4/6  Labs:   Recent Labs Lab 07/06/14 1940  07/10/14 0530 07/11/14 1143 07/13/14 0415  NA  --   < > 138 139 141  K  --   < > 4.4 4.4 4.4  CL  --   < > 109 111 112  CO2  --   < > 22 25 21   BUN  --   < > 13 15 13   CREATININE  --   < > 1.28 1.35 1.34  CALCIUM  --   < > 8.2* 8.0* 8.3*  MG 2.1  --   --   --  1.9  GLUCOSE  --   < > 91 91 100*  < > = values in this interval not displayed.  CBG (last 3)  No results for input(s): GLUCAP in the last 72 hours.  Scheduled Meds: . amiodarone  200 mg Oral Daily  . atorvastatin  40 mg Oral QHS  . digoxin  125 mcg Oral Daily  . feeding supplement (ENSURE ENLIVE)  237 mL Oral BID BM  . metoprolol tartrate  25 mg Oral BID  . [START ON 07/15/2014] pantoprazole (PROTONIX) IV  40 mg Intravenous Q12H  . saccharomyces boulardii  250 mg Oral BID  . sodium chloride  3 mL Intravenous Q12H  . vancomycin  750 mg Intravenous Q12H    Continuous Infusions: . pantoprozole (PROTONIX) infusion 8 mg/hr (07/12/14  2224)    Molli Barrows, [place], [place], [place] Pager [phone] After Hours Pager [phone]

## 2014-07-13 NOTE — Progress Notes (Signed)
Patient complaining of 5/10 abdomen, right upper quadrant "gas pain".  Also, recent troponin resulted at 0.77.  [facility] paged and notified pertaining to recent troponin and gas pain.  Order entered per Tylene Fantasia on call for [facility].

## 2014-07-13 NOTE — Progress Notes (Signed)
[facility] PROGRESS NOTE  HOSIE SHARMAN VQX:[id number] DOB: 09-27-1940 DOA: 07/06/2014 PCP: No primary care provider on file.  Assessment/Plan: #1 viridans group streptococcal bacteremia and endocarditis Patient currently on IV vancomycin. Patient will need at least 4 weeks of IV vancomycin ( D8/28). Patient is currently afebrile. CBC within normal limits. Cardiology and ID following. Patient has been seen in consultation by cardiothoracic surgery for further evaluation of endocarditis and aortic valve and severe aortic valvular regurgitation. Dr. Lawana Chambers of dentistry, has also assessed patient and recommending extraction of all teeth except for 1 complex 1. Continue IV antibiotics through 08/04/2014. ID following..  #2 right knee pain MRI of the right knee with tricompartmental osteoarthritis most advanced within the medial compartment. Small complex knee joint effusion no specific signs of joint infection osteomyelitis on noncontrast imaging. Extensive degenerative tearing of the medial meniscus. Anterior cruciate ligament mucoid degeneration. Pain management. Follow.  #3 severe aortic insufficiency/aortic valvular endocarditis Patient currently on IV antibiotics. Patient also noted to have a STEMI. Consult with cardiothoracic surgery for further evaluation and management. Patient may need a aVR/CABG. Patient is currently off anticoagulation secondary to recent GI bleed and is too high risk for colonoscopy. Patient will need several weeks of IV antibiotics through 08/04/2014. Cardiothoracic surgeon and ID following.  #4 GI bleed likely diverticular in nature Patient has been seen by gastroenterology feel patient is too high risk for colonoscopy and physical patient likely has a diverticular bleed. Patient denies any further overt bleeding today. Anticoagulation on hold. Hemoglobin currently stable at 9.9. Follow H&H. Advance diet.  #5 non-STEMI in the setting of sepsis Patient had  presented to [place] with syncope. Troponins were elevated at 11 the patient initially admitted under cardiology service. Patient denies any current chest pain. Troponin levels were 4.03, 3.46, 3.11. 2-D echo showed an EF of 55-60% with grade 2 diastolic dysfunction. Patient had EKG changes with T-wave inversions in leads 2 in the lateral leads documented in [facility]&P. At [facility] within the last 2 weeks were reviewed by cardiology and patient noted to have LAD moderate disease. Left circumflex has a tight stenosis but not a good candidate for CABG. Subtotal OM. RCA has distal disease or could potentially be stented. Continue current medical management. Heparin drip has been discontinued secondary to GI bleed. Aspirin has also been discontinued. CT surgery has been consulted and patient was seen by Dr. Cyndia Bent we will decide on the timing of surgery.. Cardiology following.  #6 chronic systolic heart failure Patient also noted to have severe aortic insufficiency. TEE with a EF of 60% severe aortic regurgitation, severe tricuspid regurgitation, vegetation on aortic valve. Continue current beta blocker. Follow.   #7 right lower lobe pneumonia Patient on IV antibiotics.  #8 atrial fibrillation Patient is currently rate controlled. Anticoagulation has been discontinued secondary to GI bleed. Follow.  #9 hypokalemia Repleted.  #10 prophylaxis SCDs for DVT prophylaxis.  Code Status: Full Family Communication: Updated patient and wife at bedside. Disposition Plan: Remain inpatient.   Consultants:  GI Dr. Benson Norway 07/11/2014  Cardiology: also cardiology service 07/06/2014  Infectious diseases Dr. Megan Salon  Cardiothoracic surgery: Dr. Cyndia Bent 07/12/2014  Dentist: Dr. Enrique Sack 07/13/2014  Procedures:  MRI right knee 07/11/2014  2-D echo 07/08/2014  TEE 07/11/2014  Panorex 07/10/2014  Chest x-ray 07/06/2014  Antibiotics:  IV  vancomycin 07/06/2014  IV Levaquin 07/07/2014 >>> 07/10/2014    HPI/Subjective: Patient states he's feeling better than on admission. No complaints.  Objective:  Filed Vitals:   07/13/14 1405  BP: 126/32  Pulse: 66  Temp: 97.9 F (36.6 C)  Resp: 19    Intake/Output Summary (Last 24 hours) at 07/13/14 1516 Last data filed at 07/13/14 1213  Gross per 24 hour  Intake    480 ml  Output   1050 ml  Net   -570 ml   Filed Weights   07/11/14 0457 07/12/14 0544 07/13/14 0456  Weight: 82.555 kg (182 lb) 85.73 kg (189 lb) 84.596 kg (186 lb 8 oz)    Exam:   General:  NAD  Cardiovascular: Irregularly irregular with 3/6 SEM  Respiratory: CTAB  Abdomen: Soft, nontender, nondistended, positive bowel sounds.  Musculoskeletal: No clubbing cyanosis or edema.  Data Reviewed: Basic Metabolic Panel:  Recent Labs Lab 07/06/14 1940  07/08/14 0333 07/09/14 0458 07/10/14 0530 07/11/14 1143 07/13/14 0415  NA  --   < > 138 139 138 139 141  K  --   < > 3.4* 4.3 4.4 4.4 4.4  CL  --   < > 110 112 109 111 112  CO2  --   < > 23 20 22 25 21   GLUCOSE  --   < > 88 88 91 91 100*  BUN  --   < > 22 17 13 15 13   CREATININE  --   < > 1.25 1.24 1.28 1.35 1.34  CALCIUM  --   < > 8.0* 8.3* 8.2* 8.0* 8.3*  MG 2.1  --   --   --   --   --  1.9  < > = values in this interval not displayed. Liver Function Tests:  Recent Labs Lab 07/07/14 0519 07/08/14 0333 07/09/14 0458 07/10/14 0530  AST 31 37 35 33  ALT 21 25 26 24   ALKPHOS 64 66 72 69  BILITOT 0.6 0.8 0.5 0.6  PROT 5.3* 5.4* 5.9* 5.6*  ALBUMIN 1.9* 1.9* 2.1* 2.0*   No results for input(s): LIPASE, AMYLASE in the last 168 hours. No results for input(s): AMMONIA in the last 168 hours. CBC:  Recent Labs Lab 07/09/14 0458 07/10/14 0530 07/11/14 1143 07/12/14 0402 07/13/14 0415  WBC 7.9 7.7 8.1 9.1 8.7  HGB 9.0* 8.8* 8.9* 10.1* 9.9*  HCT 29.0* 28.7* 28.6* 32.2* 31.9*  MCV 78.4 79.5 81.0 80.7 81.4  PLT 306 303 282 264 240    Cardiac Enzymes:  Recent Labs Lab 07/06/14 1940 07/07/14 0005 07/07/14 0519 07/13/14 0415 07/13/14 1039  TROPONINI 4.03* 3.46* 3.11* 0.77* 0.71*   BNP (last 3 results)  Recent Labs  07/06/14 1940  BNP 779.9*    ProBNP (last 3 results) No results for input(s): PROBNP in the last 8760 hours.  CBG: No results for input(s): GLUCAP in the last 168 hours.  Recent Results (from the past 240 hour(s))  Culture, blood (routine x 2)     Status: None   Collection Time: 07/06/14  7:40 PM  Result Value Ref Range Status   Specimen Description BLOOD LEFT ANTECUBITAL  Final   Special Requests BOTTLES DRAWN AEROBIC AND ANAEROBIC 3CC EA  Final   Culture   Final    VIRIDANS STREPTOCOCCUS Note: Gram Stain Report Called to,Read Back By and Verified With: DEVON RN ON 3W AT 1412 [id number] BY CASTC Performed at [facility]    Report Status 07/11/2014 FINAL  Final  Culture, blood (routine x 2)     Status: None   Collection Time: 07/06/14  7:50 PM  Result Value Ref Range  Status   Specimen Description BLOOD LEFT HAND  Final   Special Requests BOTTLES DRAWN AEROBIC ONLY 2CC  Final   Culture   Final    NO GROWTH 5 DAYS Performed at [facility]    Report Status 07/13/2014 FINAL  Final  Culture, blood (routine x 2)     Status: None   Collection Time: 07/07/14 10:46 AM  Result Value Ref Range Status   Specimen Description BLOOD LEFT ARM  Final   Special Requests BOTTLES DRAWN AEROBIC AND ANAEROBIC 10CC EACH  Final   Culture   Final    NO GROWTH 5 DAYS Performed at [facility]    Report Status 07/13/2014 FINAL  Final  Culture, blood (routine x 2)     Status: None   Collection Time: 07/07/14 10:53 AM  Result Value Ref Range Status   Specimen Description BLOOD LEFT HAND  Final   Special Requests BOTTLES DRAWN AEROBIC AND ANAEROBIC 10CC EACH  Final   Culture   Final    NO GROWTH 5 DAYS Performed at [facility]    Report Status 07/13/2014 FINAL   Final  Clostridium Difficile by PCR     Status: None   Collection Time: 07/09/14 12:42 AM  Result Value Ref Range Status   C difficile by pcr NEGATIVE NEGATIVE Final  Body fluid culture     Status: None (Preliminary result)   Collection Time: 07/10/14  2:00 PM  Result Value Ref Range Status   Specimen Description FLUID KNEE RIGHT SYNOVIAL  Final   Special Requests NONE  Final   Gram Stain   Final    RARE WBC PRESENT,BOTH PMN AND MONONUCLEAR NO ORGANISMS SEEN Performed at [facility]    Culture   Final    NO GROWTH 2 DAYS Performed at [facility]    Report Status PENDING  Incomplete     Studies: Mr Knee Right Wo Contrast  07/12/2014   CLINICAL DATA:  Recent acute onset of knee pain. Limited range of motion. No fluid aspirated on attempted arthrocentesis. Initial encounter.  EXAM: MRI OF THE RIGHT KNEE WITHOUT CONTRAST  TECHNIQUE: Multiplanar, multisequence MR imaging of the knee was performed. No intravenous contrast was administered.  COMPARISON:  None.  FINDINGS: MENISCI  Medial meniscus: There is extensive degenerative tearing throughout the medial meniscus, primarily within the posterior horn and body. The meniscus is largely extruded from the joint.  Lateral meniscus:  Diffuse degeneration without focal tear.  LIGAMENTS  Cruciates:  Intact.  There is mild ACL mucoid degeneration.  Collaterals:  Intact.  CARTILAGE  Patellofemoral: Moderate patellofemoral degenerative changes with chondral thinning and osteophytes. No focal subchondral signal abnormality.  Medial: Advanced osteoarthritis with diffuse chondral thinning, osteophytes and subchondral eburnation.  Lateral: Moderate osteoarthritis with chondral thinning, surface irregularity and osteophytes.  Joint:  Minimal complex joint effusion.  Popliteal Fossa:  Unremarkable. No significant Baker's cyst.  Extensor Mechanism: Intact. There is moderate prepatellar subcutaneous edema without focal fluid collection. There is  mild edema centrally in Hoffa's fat.  Bones: No significant extra-articular osseous findings. There is no cortical destruction or suspicious periarticular edema to suggest osteomyelitis.  IMPRESSION: 1. Tricompartmental osteoarthritis, most advanced within the medial compartment. 2. Small complex knee joint effusion. No specific signs of joint infection or osteomyelitis on noncontrast imaging. 3. Extensive degenerative tearing of the medial meniscus. 4. ACL mucoid degeneration.   Electronically Signed   By: Richardean Sale M.D.   On: 07/12/2014 08:41  Scheduled Meds: . amiodarone  200 mg Oral Daily  . atorvastatin  40 mg Oral QHS  . digoxin  125 mcg Oral Daily  . feeding supplement (ENSURE ENLIVE)  237 mL Oral BID BM  . furosemide  40 mg Oral Daily  . metoprolol tartrate  25 mg Oral BID  . [START ON 07/15/2014] pantoprazole (PROTONIX) IV  40 mg Intravenous Q12H  . saccharomyces boulardii  250 mg Oral BID  . sodium chloride  3 mL Intravenous Q12H  . vancomycin  750 mg Intravenous Q12H   Continuous Infusions: . pantoprozole (PROTONIX) infusion 8 mg/hr (07/13/14 1109)    Principal Problem:   Viridans streptococci infection Active Problems:   Acute and subacute infective endocarditis in diseases classified elsewhere   NSTEMI (non-ST elevated myocardial infarction)   Bacteremia   Atrial fibrillation   Chronic systolic CHF (congestive heart failure)   Right lower lobe pneumonia   Protein-calorie malnutrition, severe   Unintentional weight loss   Acute pain of right knee   Normocytic anemia   Hypertension   Coronary artery disease   Dyslipidemia   Knee pain, acute   Aortic regurgitation   Poor dentition    Time spent: 67 mins    [facility] MD [facility] Pager [phone]. If 7PM-7AM, please contact night-coverage at [email or web address], password [facility] 07/13/2014, 3:16 PM  LOS: 7 days

## 2014-07-14 LAB — BASIC METABOLIC PANEL
Anion gap: 4 — ABNORMAL LOW (ref 5–15)
BUN: 12 mg/dL (ref 6–23)
CHLORIDE: 112 mmol/L (ref 96–112)
CO2: 25 mmol/L (ref 19–32)
Calcium: 8.2 mg/dL — ABNORMAL LOW (ref 8.4–10.5)
Creatinine, Ser: 1.37 mg/dL — ABNORMAL HIGH (ref 0.50–1.35)
GFR calc Af Amer: 57 mL/min — ABNORMAL LOW (ref >90)
GFR calc non Af Amer: 49 mL/min — ABNORMAL LOW (ref >90)
Glucose, Bld: 90 mg/dL (ref 70–99)
Potassium: 3.8 mmol/L (ref 3.5–5.1)
SODIUM: 141 mmol/L (ref 135–145)

## 2014-07-14 LAB — CBC
HCT: 33.8 % — ABNORMAL LOW (ref 39.0–52.0)
HEMOGLOBIN: 10.7 g/dL — AB (ref 13.0–17.0)
MCH: 25.6 pg — AB (ref 26.0–34.0)
MCHC: 31.7 g/dL (ref 30.0–36.0)
MCV: 80.9 fL (ref 78.0–100.0)
PLATELETS: 232 10*3/uL (ref 150–400)
RBC: 4.18 MIL/uL — ABNORMAL LOW (ref 4.22–5.81)
RDW: 19 % — AB (ref 11.5–15.5)
WBC: 8.8 10*3/uL (ref 4.0–10.5)

## 2014-07-14 LAB — BODY FLUID CULTURE: Culture: NO GROWTH

## 2014-07-14 MED ORDER — VANCOMYCIN HCL IN DEXTROSE 750-5 MG/150ML-% IV SOLN: 750.0000 mg | Freq: Two times a day (BID) | INTRAVENOUS | Status: AC

## 2014-07-14 MED ORDER — WHITE PETROLATUM GEL
Status: AC
  Administered 2014-07-14: 15:00:00
  Filled 2014-07-14: qty 1

## 2014-07-14 MED ORDER — SODIUM CHLORIDE 0.9 % IJ SOLN: 10.0000 mL | INTRAMUSCULAR | Status: DC | PRN

## 2014-07-14 MED ORDER — APIXABAN 5 MG PO TABS
5.0000 mg | ORAL_TABLET | Freq: Two times a day (BID) | ORAL | Status: DC
  Administered 2014-07-14 – 2014-07-15 (×3): 5 mg via ORAL
  Filled 2014-07-14 (×3): qty 1

## 2014-07-14 NOTE — Progress Notes (Signed)
Peripherally Inserted Central Catheter/Midline Placement  The IV Nurse has discussed with the patient and/or persons authorized to consent for the patient, the purpose of this procedure and the potential benefits and risks involved with this procedure.  The benefits include less needle sticks, lab draws from the catheter and patient may be discharged home with the catheter.  Risks include, but not limited to, infection, bleeding, blood clot (thrombus formation), and puncture of an artery; nerve damage and irregular heat beat.  Alternatives to this procedure were also discussed.  PICC/Midline Placement Documentation        Joe Kennedy 07/14/2014, 10:57 AM

## 2014-07-14 NOTE — Progress Notes (Addendum)
[id number] Cardiac Rehab Pt had walked with OT earlier and is tired now. Completed pre-op education with pt. and wife. They voice understanding. I gave pt and wife surgery education booklet and pt care guide. Also gave pt IS and instructed him how to use it. I encouraged them to watch going for surgery video. Deon Pilling, RN 07/14/2014 11:58 AM

## 2014-07-14 NOTE — Progress Notes (Signed)
ANTIBIOTIC CONSULT NOTE - FOLLOW UP  Pharmacy Consult for Vancomycin Indication: Endocarditis  Allergies  Allergen Reactions  . Lipitor [Atorvastatin] Other (See Comments)    Abdominal pain, nausea and chest pain  . Penicillins     Difficulty breathing    Patient Measurements: Height: 6' (182.9 cm) Weight: 182 lb 5.1 oz (82.7 kg) IBW/kg (Calculated) : 77.6 Adjusted Body Weight:    Vital Signs: Temp: 97.8 F (36.6 C) (04/08 0520) Temp Source: Oral (04/08 0520) BP: 147/48 mmHg (04/08 0520) Pulse Rate: 70 (04/08 0520) Intake/Output from previous day: 04/07 0701 - 04/08 0700 In: 360 [P.O.:360] Out: 1776 [Urine:1775; Stool:1] Intake/Output from this shift: Total I/O In: 240 [P.O.:240] Out: -   Labs:  Recent Labs  07/12/14 0402 07/13/14 0415 07/14/14 0328  WBC 9.1 8.7 8.8  HGB 10.1* 9.9* 10.7*  PLT 264 240 232  CREATININE  --  1.34 1.37*   Estimated Creatinine Clearance: 51.9 mL/min (by C-G formula based on Cr of 1.37). No results for input(s): VANCOTROUGH, VANCOPEAK, VANCORANDOM, GENTTROUGH, GENTPEAK, GENTRANDOM, TOBRATROUGH, TOBRAPEAK, TOBRARND, AMIKACINPEAK, AMIKACINTROU, AMIKACIN in the last 72 hours.   Microbiology: Recent Results (from the past 720 hour(s))  Culture, blood (routine x 2)     Status: None   Collection Time: 07/06/14  7:40 PM  Result Value Ref Range Status   Specimen Description BLOOD LEFT ANTECUBITAL  Final   Special Requests BOTTLES DRAWN AEROBIC AND ANAEROBIC 3CC EA  Final   Culture   Final    VIRIDANS STREPTOCOCCUS Note: Gram Stain Report Called to,Read Back By and Verified With: DEVON RN ON 3W AT 1412 [id number] BY CASTC Performed at [facility]    Report Status 07/11/2014 FINAL  Final  Culture, blood (routine x 2)     Status: None   Collection Time: 07/06/14  7:50 PM  Result Value Ref Range Status   Specimen Description BLOOD LEFT HAND  Final   Special Requests BOTTLES DRAWN AEROBIC ONLY 2CC  Final   Culture   Final    NO  GROWTH 5 DAYS Performed at [facility]    Report Status 07/13/2014 FINAL  Final  Culture, blood (routine x 2)     Status: None   Collection Time: 07/07/14 10:46 AM  Result Value Ref Range Status   Specimen Description BLOOD LEFT ARM  Final   Special Requests BOTTLES DRAWN AEROBIC AND ANAEROBIC 10CC EACH  Final   Culture   Final    NO GROWTH 5 DAYS Performed at [facility]    Report Status 07/13/2014 FINAL  Final  Culture, blood (routine x 2)     Status: None   Collection Time: 07/07/14 10:53 AM  Result Value Ref Range Status   Specimen Description BLOOD LEFT HAND  Final   Special Requests BOTTLES DRAWN AEROBIC AND ANAEROBIC 10CC EACH  Final   Culture   Final    NO GROWTH 5 DAYS Performed at [facility]    Report Status 07/13/2014 FINAL  Final  Clostridium Difficile by PCR     Status: None   Collection Time: 07/09/14 12:42 AM  Result Value Ref Range Status   C difficile by pcr NEGATIVE NEGATIVE Final  Body fluid culture     Status: None   Collection Time: 07/10/14  2:00 PM  Result Value Ref Range Status   Specimen Description FLUID KNEE RIGHT SYNOVIAL  Final   Special Requests NONE  Final   Gram Stain   Final  RARE WBC PRESENT,BOTH PMN AND MONONUCLEAR NO ORGANISMS SEEN Performed at [facility]    Culture   Final    NO GROWTH 3 DAYS Performed at [facility]    Report Status 07/14/2014 FINAL  Final    Anti-infectives    Start     Dose/Rate Route Frequency Ordered Stop   07/14/14 0000  Vancomycin (VANCOCIN) 750 MG/150ML SOLN     750 mg 150 mL/hr over 60 Minutes Intravenous Every 12 hours 07/14/14 1124 08/04/14 2359   07/10/14 1200  levofloxacin (LEVAQUIN) IVPB 750 mg     750 mg 100 mL/hr over 90 Minutes Intravenous Every 24 hours 07/10/14 0708 07/10/14 1314   07/08/14 1200  levofloxacin (LEVAQUIN) IVPB 750 mg  Status:  Discontinued     750 mg 100 mL/hr over 90 Minutes Intravenous Every 24 hours 07/08/14 1124 07/10/14  0708   07/07/14 1000  levofloxacin (LEVAQUIN) tablet 750 mg  Status:  Discontinued     750 mg Oral Daily 07/06/14 1656 07/06/14 1734   07/07/14 1000  levofloxacin (LEVAQUIN) tablet 750 mg  Status:  Discontinued     750 mg Oral Every 48 hours 07/06/14 1734 07/07/14 0954   07/07/14 1000  levofloxacin (LEVAQUIN) tablet 750 mg  Status:  Discontinued     750 mg Oral Daily 07/07/14 0954 07/08/14 1124   07/06/14 2200  vancomycin (VANCOCIN) IVPB 750 mg/150 ml premix     750 mg 150 mL/hr over 60 Minutes Intravenous Every 12 hours 07/06/14 1734        Assessment: 74 year old male  with syncope transferred from [facility] to [facility] for further cardiac evaluation and possible CABG.  Anticoagulation: Afib/NSTEMI, on Eliquis PTA. CHADSVASC=4. Heparin resumed 4/4 post knee tap. Heparin is now off d/t bloody diarrhea and likely diverticular bleed. Eliquis resumed 4/8.  Infectious Disease: Vanc D9/30,  viridans streptococcal bacteremia w/ aortic valve endocarditis, pneumonia and possible septic arthritis of his right knee.  TEE- aortic valve- small vegetations on leaflets that are thickened. Pt noted for dental consult- possible periapical abscess of the left lateral mandibular incisor and noted for dental surgery. Will need dental surgery then surgery plans per CVTS (consult for AVR/CABG)- Afebrile. WBC 8.8. Vanc 4 weeks from neg blood cx per ID; consider 4/1 as day 1?  Vanc 3/31> VT= 17.1 on 4/4 Levaquin 3/31> 4/4  GPC in 2 BC at [place] - Strep viridans per ID 4/4 R knee fluid- ngtd 4/2 C diff - neg 4/1 blood x 2 - neg 3/31 blood x 2 - viridans strep 1/2  Cardiovascular: CAD, HLD, HTN,  HF, Afib. Hospitalized 3/16 with NSTEMI with 2V dz not amenable to intervention. EF 55-60 per echo 4/2. Mod-severe AR and mild MR, mod-severe tricuspid regurg, too. ASA d/c 4/5 due to GIB. -CVTS consult for AVR/CABG Meds: Amio, digoxin, po Lasix, metoprolol  Endo: TSH ok, glucose ok   GI/Nutr: Ensure BID, Florastor. abd  pain, loose stool 4/2, c diff neg. FOBT pos on 4/2 and 4/5. Bloody diarrhea on 4/5 and s/p PRBC. Protonix gtt (4/5>>) at high risk for colonoscopy.  GI suspects diverticular bleed on IV PPI  Nephrology: Scr 1.37 crcl ~52  Pulmonary: RA  Heme/Onc: Chronic anemia. Hgb 10.7, Plts 232  PTA Medication Issues: off Imdur, Lisinopril  Best Practices: scds, IV PPI  Goal of Therapy:  Vancomycin trough level 15-20 mcg/ml  Plan:  Vancomycin 750mg  IV q12 hrs PICC today HOLD ELIQUIS 2 DAYS PRIOR TO DENTAL PROCEDURE AND DON'T  FORGET TO RESUME AFTERWARDS!!  Kamorah Nevils S. Alford Highland, PharmD, BCPS Clinical Staff Pharmacist Pager [phone]  Eilene Ghazi Stillinger 07/14/2014,1:00 PM

## 2014-07-14 NOTE — Progress Notes (Signed)
[facility] PROGRESS NOTE  MALIKHI OGAN CBJ:[id number] DOB: [birth year] DOA: 07/06/2014 PCP: No primary care provider on file.  Assessment/Plan: #1 viridans group streptococcal bacteremia and endocarditis Patient currently on IV vancomycin. Patient will need at least 4 weeks of IV vancomycin ( D8/28). Patient is currently afebrile. CBC within normal limits. Cardiology and ID following. Patient has been seen in consultation by cardiothoracic surgery for further evaluation of endocarditis and aortic valve and severe aortic valvular regurgitation. Dr. Lawana Chambers of dentistry, has also assessed patient and recommending extraction of all teeth except for 1 complex 1. Patient is scheduled to the operating room on 07/20/2014 for extraction of his teeth. Anticoagulation need to be held 2 days prior to procedure. Continue IV antibiotics through 08/04/2014. ID following..  #2 right knee pain MRI of the right knee with tricompartmental osteoarthritis most advanced within the medial compartment. Small complex knee joint effusion no specific signs of joint infection osteomyelitis on noncontrast imaging. Extensive degenerative tearing of the medial meniscus. Anterior cruciate ligament mucoid degeneration. Pain management. Follow.  #3 severe aortic insufficiency/aortic valvular endocarditis Patient currently on IV antibiotics. Patient also noted to have a STEMI. Consult with cardiothoracic surgery for further evaluation and management. Patient may need a aVR/CABG. Patient has been off anticoagulation secondary to recent GI bleed and is too high risk for colonoscopy. Patient will need several weeks of IV antibiotics through 08/04/2014. Cardiothoracic surgeon and ID following. Anticoagulation has been resumed today per cardiology as patient is at increased risk for septic emboli and CVA. Anticoagulation will need to be held 2 days prior to future procedures. Per cardiology.  #4 GI bleed likely diverticular in  nature Patient has been seen by gastroenterology feel patient is too high risk for colonoscopy and physical patient likely has a diverticular bleed. Patient denies any further overt bleeding today.  Hemoglobin currently stable at 10.7. Follow H&H. Tolerating current diet. Monitor closely as anticoagulation was resumed today.  #5 non-STEMI in the setting of sepsis Patient had presented to [place] with syncope. Troponins were elevated at 11 the patient initially admitted under cardiology service. Patient denies any current chest pain. Troponin levels were 4.03, 3.46, 3.11. 2-D echo showed an EF of 55-60% with grade 2 diastolic dysfunction. Patient had EKG changes with T-wave inversions in leads 2 in the lateral leads documented in [facility] H&[facility]. At [facility] within the last 2 weeks were reviewed by cardiology and patient noted to have LAD moderate disease. Left circumflex has a tight stenosis but not a good candidate for CABG. Subtotal OM. RCA has distal disease or could potentially be stented. Continue current medical management. Heparin drip has been discontinued secondary to GI bleed. Aspirin has also been discontinued. CT surgery has been consulted and patient was seen by Dr. Cyndia Bent we will decide on the timing of surgery.. Cardiology following.  #6 chronic systolic heart failure Patient also noted to have severe aortic insufficiency. TEE with a EF of 60% severe aortic regurgitation, severe tricuspid regurgitation, vegetation on aortic valve. Continue current beta blocker. Lasix has been resumed per cardiology with good diuresis. Per cardiology.  #7 right lower lobe pneumonia Patient on IV antibiotics.  #8 atrial fibrillation Patient is currently rate controlled. Anticoagulation was discontinued secondary to GI bleed. Patient has been started on eliquis per cardiology due to concerns for increased risk for septic emboli and possible CVA .  Anticoagulation can be held 2 days prior to future procedures. Follow.  #9 hypokalemia Repleted.  #  10 prophylaxis SCDs for DVT prophylaxis.  Code Status: Full Family Communication: Updated patient and wife at bedside. Disposition Plan: Hopefully discharge this weekend.   Consultants:  GI Dr. Benson Norway 07/11/2014  Cardiology: also cardiology service 07/06/2014  Infectious diseases Dr. Megan Salon  Cardiothoracic surgery: Dr. Cyndia Bent 07/12/2014  Dentist: Dr. Enrique Sack 07/13/2014  Procedures:  MRI right knee 07/11/2014  2-D echo 07/08/2014  TEE 07/11/2014  Panorex 07/10/2014  Chest x-ray 07/06/2014  Antibiotics:  IV vancomycin 07/06/2014  IV Levaquin 07/07/2014 >>> 07/10/2014    HPI/Subjective: Patient states he's feeling better than on admission. No complaints. Tolerated diet. Patient states his appetite is coming back.  Objective: Filed Vitals:   07/14/14 1412  BP: 151/53  Pulse: 78  Temp: 98.3 F (36.8 C)  Resp: 16    Intake/Output Summary (Last 24 hours) at 07/14/14 1653 Last data filed at 07/14/14 1400  Gross per 24 hour  Intake    600 ml  Output   2251 ml  Net  -1651 ml   Filed Weights   07/12/14 0544 07/13/14 0456 07/14/14 0520  Weight: 85.73 kg (189 lb) 84.596 kg (186 lb 8 oz) 82.7 kg (182 lb 5.1 oz)    Exam:   General:  NAD  Cardiovascular: Irregularly irregular with 3/6 SEM  Respiratory: CTAB  Abdomen: Soft, nontender, nondistended, positive bowel sounds.  Musculoskeletal: No clubbing cyanosis or edema.  Data Reviewed: Basic Metabolic Panel:  Recent Labs Lab 07/09/14 0458 07/10/14 0530 07/11/14 1143 07/13/14 0415 07/14/14 0328  NA 139 138 139 141 141  K 4.3 4.4 4.4 4.4 3.8  CL 112 109 111 112 112  CO2 20 22 25 21 25   GLUCOSE 88 91 91 100* 90  BUN 17 13 15 13 12   CREATININE 1.24 1.28 1.35 1.34 1.37*  CALCIUM 8.3* 8.2* 8.0* 8.3* 8.2*  MG  --   --   --  1.9  --    Liver Function Tests:  Recent Labs Lab 07/08/14 0333  07/09/14 0458 07/10/14 0530  AST 37 35 33  ALT 25 26 24   ALKPHOS 66 72 69  BILITOT 0.8 0.5 0.6  PROT 5.4* 5.9* 5.6*  ALBUMIN 1.9* 2.1* 2.0*   No results for input(s): LIPASE, AMYLASE in the last 168 hours. No results for input(s): AMMONIA in the last 168 hours. CBC:  Recent Labs Lab 07/10/14 0530 07/11/14 1143 07/12/14 0402 07/13/14 0415 07/14/14 0328  WBC 7.7 8.1 9.1 8.7 8.8  HGB 8.8* 8.9* 10.1* 9.9* 10.7*  HCT 28.7* 28.6* 32.2* 31.9* 33.8*  MCV 79.5 81.0 80.7 81.4 80.9  PLT 303 282 264 240 232   Cardiac Enzymes:  Recent Labs Lab 07/13/14 0415 07/13/14 1039 07/13/14 1603  TROPONINI 0.77* 0.71* 0.71*   BNP (last 3 results)  Recent Labs  07/06/14 1940  BNP 779.9*    ProBNP (last 3 results) No results for input(s): PROBNP in the last 8760 hours.  CBG: No results for input(s): GLUCAP in the last 168 hours.  Recent Results (from the past 240 hour(s))  Culture, blood (routine x 2)     Status: None   Collection Time: 07/06/14  7:40 PM  Result Value Ref Range Status   Specimen Description BLOOD LEFT ANTECUBITAL  Final   Special Requests BOTTLES DRAWN AEROBIC AND ANAEROBIC 3CC EA  Final   Culture   Final    VIRIDANS STREPTOCOCCUS Note: Gram Stain Report Called to,Read Back By and Verified With: DEVON RN ON 3W AT 1412 [id number] BY CASTC Performed at [facility]    Report Status 07/11/2014 FINAL  Final  Culture, blood (routine x 2)     Status: None   Collection Time: 07/06/14  7:50 PM  Result Value Ref Range Status   Specimen Description BLOOD LEFT HAND  Final   Special Requests BOTTLES DRAWN AEROBIC ONLY 2CC  Final   Culture   Final    NO GROWTH 5 DAYS Performed at [facility]    Report Status 07/13/2014 FINAL  Final  Culture, blood (routine x 2)     Status: None   Collection Time: 07/07/14 10:46 AM  Result Value Ref Range Status   Specimen Description BLOOD LEFT ARM  Final   Special Requests BOTTLES DRAWN AEROBIC AND ANAEROBIC 10CC  EACH  Final   Culture   Final    NO GROWTH 5 DAYS Performed at [facility]    Report Status 07/13/2014 FINAL  Final  Culture, blood (routine x 2)     Status: None   Collection Time: 07/07/14 10:53 AM  Result Value Ref Range Status   Specimen Description BLOOD LEFT HAND  Final   Special Requests BOTTLES DRAWN AEROBIC AND ANAEROBIC 10CC EACH  Final   Culture   Final    NO GROWTH 5 DAYS Performed at [facility]    Report Status 07/13/2014 FINAL  Final  Clostridium Difficile by PCR     Status: None   Collection Time: 07/09/14 12:42 AM  Result Value Ref Range Status   C difficile by pcr NEGATIVE NEGATIVE Final  Body fluid culture     Status: None   Collection Time: 07/10/14  2:00 PM  Result Value Ref Range Status   Specimen Description FLUID KNEE RIGHT SYNOVIAL  Final   Special Requests NONE  Final   Gram Stain   Final    RARE WBC PRESENT,BOTH PMN AND MONONUCLEAR NO ORGANISMS SEEN Performed at [facility]    Culture   Final    NO GROWTH 3 DAYS Performed at [facility]    Report Status 07/14/2014 FINAL  Final     Studies: No results found.  Scheduled Meds: . amiodarone  200 mg Oral Daily  . apixaban  5 mg Oral BID  . digoxin  125 mcg Oral Daily  . feeding supplement (ENSURE ENLIVE)  237 mL Oral BID BM  . furosemide  40 mg Oral Daily  . metoprolol tartrate  25 mg Oral BID  . [START ON 07/15/2014] pantoprazole (PROTONIX) IV  40 mg Intravenous Q12H  . saccharomyces boulardii  250 mg Oral BID  . sodium chloride  3 mL Intravenous Q12H  . vancomycin  750 mg Intravenous Q12H   Continuous Infusions:    Principal Problem:   Viridans streptococci infection Active Problems:   Acute and subacute infective endocarditis in diseases classified elsewhere   NSTEMI (non-ST elevated myocardial infarction)   Bacteremia   Atrial fibrillation   Chronic systolic CHF (congestive heart failure)   Right lower lobe pneumonia   Protein-calorie  malnutrition, severe   Unintentional weight loss   Acute pain of right knee   Normocytic anemia   Hypertension   Coronary artery disease   Dyslipidemia   Knee pain, acute   Aortic regurgitation   Poor dentition   Severe aortic insufficiency    Time spent: 40 mins    [facility] MD [facility] Pager [phone]. If 7PM-7AM, please contact night-coverage at [email or web address], password [facility] 07/14/2014, 4:53 PM  LOS: 8 days

## 2014-07-14 NOTE — Progress Notes (Signed)
[facility]  Patient Status: New patient with [facility] this admission  [facility] is providing the following services: HHRN, PT, OT and Home Infusion Pharmacy for home IV ABX.  [facility] Infusion Coordinator provided in hospital teaching with pt and wife re: home IV ABX set up and administration to support independence at home.  [facility] is prepared for DC to home on Saturday. Script for Vancomycin already obtained and faxed to [facility] pharmacy team.  [facility] will follow to support DC home.  [facility] RN scheduled for 6-8 PM visit on 07-15-14.  If patient discharges after hours, please call [phone].   Larry Sierras 07/14/2014, 10:42 PM

## 2014-07-14 NOTE — Progress Notes (Signed)
PT Cancellation Note  Patient Details Name: Joe Kennedy MRN: [id number] DOB: May 14, 1940   Cancelled Treatment:    Reason Eval/Treat Not Completed: Other (comment) (Pt amb short distances frequently in room and due to rt knee pain doesn't want to amb further.)   Ercia Crisafulli 07/14/2014, 3:24 PM

## 2014-07-14 NOTE — Progress Notes (Addendum)
Pt Profile: 74 y.o. male with a history of atrial fibrillation on Eliquis, CAD, HLD, chronic systolic heart failure (EF 35%), chronic anemia, and HTN who presented to [facility] with a syncopal episode on 07/04/2014. He eventually ruled in for NSTEMI and was transferred to [facility] today for evaluation by [facility].  He is followed by Dr. Clayborn Bigness for cardiology. He was hospitalized earlier in March with angina, NSTEMI and had a cardiac catheterization, which showed two-vessel disease not amenable to intervention. Medical management was recommended. He was sent home on medical therapy.   Now admitted with syncope and bacteremia   Subjective: No chest pain. Mild dyspnea. 1.5L negative yesterday.   Objective: Vital signs in last 24 hours: Temp:  [97.8 F (36.6 C)-98.2 F (36.8 C)] 97.8 F (36.6 C) (04/08 0520) Pulse Rate:  [66-75] 70 (04/08 0520) Resp:  [18-20] 18 (04/08 0520) BP: (126-148)/(32-49) 147/48 mmHg (04/08 0520) SpO2:  [95 %-98 %] 98 % (04/08 0520) Weight:  [182 lb 5.1 oz (82.7 kg)] 182 lb 5.1 oz (82.7 kg) (04/08 0520) Weight change: -4 lb 2.9 oz (-1.896 kg) Last BM Date: 07/13/14 Intake/Output from previous day: 04/07 0701 - 04/08 0700 In: 360 [P.O.:360] Out: 1776 [Urine:1775; Stool:1] Intake/Output this shift: Total I/O In: 240 [P.O.:240] Out: -   PE: General:Pleasant affect, NAD, though appears ill Skin:Warm and dry, brisk capillary refill HEENT:normocephalic, sclera clear, mucus membranes moist Heart:S1S2 RRR with 3-3/2 systolic murmur, no gallup, rub or click Lungs:clear without rales, rhonchi, or wheezes RJJ:OACZ, non tender, + BS, do not palpate liver spleen or masses Ext:no lower ext edema, 2+ pedal pulses, 2+ radial pulses Neuro:alert and oriented, MAE, follows commands, + facial symmetry Tele:  SR with PVCs on occ.    Lab Results:  Recent Labs  07/13/14 0415 07/14/14 0328  WBC 8.7 8.8  HGB 9.9* 10.7*  HCT 31.9* 33.8*  PLT 240 232    BMET  Recent Labs  07/13/14 0415 07/14/14 0328  NA 141 141  K 4.4 3.8  CL 112 112  CO2 21 25  GLUCOSE 100* 90  BUN 13 12  CREATININE 1.34 1.37*  CALCIUM 8.3* 8.2*    Recent Labs  07/13/14 1039 07/13/14 1603  TROPONINI 0.71* 0.71*    Lab Results  Component Value Date   CHOL 82 07/07/2014   HDL 18* 07/07/2014   LDLCALC 43 07/07/2014   TRIG 103 07/07/2014   CHOLHDL 4.6 07/07/2014   Lab Results  Component Value Date   HGBA1C 5.9* 07/06/2014     Lab Results  Component Value Date   TSH 2.343 07/06/2014    Hepatic Function Panel No results for input(s): PROT, ALBUMIN, AST, ALT, ALKPHOS, BILITOT, BILIDIR, IBILI in the last 72 hours. No results for input(s): CHOL in the last 72 hours. No results for input(s): PROTIME in the last 72 hours.     Studies/Results: ECHO Left ventricle: The cavity size was mildly dilated. There was mild concentric hypertrophy. Systolic function was normal. The estimated ejection fraction was in the range of 50% to 55%. Features are consistent with a pseudonormal left ventricular filling pattern, with concomitant abnormal relaxation and increased filling pressure (grade 2 diastolic dysfunction). - Regional wall motion abnormality: Mild hypokinesis of the mid-apical anterior, mid anterolateral, apical lateral, and apical myocardium. - Aortic valve: Trileaflet; mildly thickened, mildly calcified leaflets. There was no stenosis. There was moderate to severe regurgitation. - Mitral valve: Mildly thickened leaflets . There was mild regurgitation. - Left atrium:  The atrium was mildly dilated. Volume/bsa, ES, (1-plane Simpson&'s, A2C): 30.1 ml/m^2. - Right ventricle: Systolic function was mildly reduced. - Right atrium: The atrium was mildly dilated. - Tricuspid valve: There was moderate-severe regurgitation. - Pulmonary arteries: PA peak pressure: 69 mm Hg (S). Severely elevated pulmonary pressures. -  Inferior vena cava: The vessel was dilated. The respirophasic diameter changes were blunted (< 50%), consistent with elevated central venous pressure.  Medications: I have reviewed the patient's current medications. Scheduled Meds: . amiodarone  200 mg Oral Daily  . digoxin  125 mcg Oral Daily  . feeding supplement (ENSURE ENLIVE)  237 mL Oral BID BM  . furosemide  40 mg Oral Daily  . metoprolol tartrate  25 mg Oral BID  . [START ON 07/15/2014] pantoprazole (PROTONIX) IV  40 mg Intravenous Q12H  . saccharomyces boulardii  250 mg Oral BID  . sodium chloride  3 mL Intravenous Q12H  . vancomycin  750 mg Intravenous Q12H   Continuous Infusions: . pantoprozole (PROTONIX) infusion Stopped (07/13/14 2130)   PRN Meds:.sodium chloride, acetaminophen, calcium carbonate, nitroGLYCERIN, ondansetron (ZOFRAN) IV, sodium chloride  Assessment/Plan: Principal Problem:   Viridans streptococci infection- plan for TEE for 07/11/14 OK per cards for diet today on Levaquin  Active Problems:   NSTEMI (non-ST elevated myocardial infarction)  Pk troponin prior to transfer 11-  Dr. Clayborn Bigness recommended CABG  medical management deep t wave inversions lat. On IV Heparin --reviewed by Drs Angelena Form and Nahser, their evaluation: The LAD has moderate disease but is not seen very well ijn all views. The LCx has a tight stenosis but is not a good target for CABG - it primarily fills a small , subtotalled OM ( that already has collateral flow). The RCA has distal disease and could potentially be stented  -on BB, no ACE due to AKI at [facility] now with Cr 1.28     Bacteremia    Atrial fibrillation- maintaining SR here was on eliquis and on amiodarone On IV heparin and dig. CHAD2S2VASc score 4- heparin on hold for possible diverticular bleed. He will need anticoagulation going forward - I would recommend we start Eliquis 5 mg BID - this would need to be held 2 days prior to his dental procedure.    Chronic systolic  CHF (congestive heart failure) EF 35%-- now with recent Echo EF 50-55% with G2DD moderate to severe AI, mild MR mod to severe TR and PA pk pressure 69 mmHg. Back on lasix 40 mg daily - good diuresis yesteday - continue today, may be able to decrease to 20 mg daily tomorrow for maintenance.    Right lower lobe pneumonia per IM, ID   Protein-calorie malnutrition, severe per IM   Unintentional weight loss per IM   Acute pain of right knee- for aspiration today    Normocytic anemia- chronic H/H 8.8/28.7-per IM   Hypertension- BP 130/34 per IM   Coronary artery disease- see above    Dyslipidemia  Pixie Casino, MD, [facility] Attending Cardiologist [facility]   LOS: 8 days  HILTY,Kenneth C 07/14/2014, 8:55 AM

## 2014-07-14 NOTE — Progress Notes (Signed)
Occupational Therapy Treatment Patient Details Name: Joe Kennedy MRN: [id number] DOB: [birth year] Today's Date: 07/14/2014    History of present illness 74 y.o. male admitted with Viridans group Streptoccal bacterermia and endocarditis.   OT comments  Pt was seen for ADL session pt continues to progress toward goal. Pt required Min assist for overall ability. Acute OT to continue with POC.    Follow Up Recommendations  Home health OT;Supervision/Assistance - 24 hour    Equipment Recommendations  3 in 1 bedside comode    Recommendations for Other Services      Precautions / Restrictions Precautions Precautions: Fall       Mobility Bed Mobility Overal bed mobility: Modified Independent             General bed mobility comments: Requires extra time  Transfers Overall transfer level: Needs assistance Equipment used: Rolling walker (2 wheeled) Transfers: Sit to/from Stand Sit to Stand: Min assist         General transfer comment: Min A for balance and impulsivity        ADL Overall ADL's : Needs assistance/impaired     Grooming: Brushing hair;Min guard;Standing       Lower Body Bathing: Minimal assistance;Sit to/from stand       Lower Body Dressing: Moderate assistance;Sit to/from stand   Toilet Transfer: Minimal assistance;Cueing for safety;Cueing for sequencing;Ambulation;Comfort height toilet;Grab bars;RW   Toileting- Water quality scientist and Hygiene: Min guard;Sit to/from stand       Functional mobility during ADLs: Minimal assistance;Rolling walker General ADL Comments: Pt needed VC's for safety due to impulsvie use of RW                 Cognition   Behavior During Therapy: [facility] for tasks assessed/performed Overall Cognitive Status: Within Functional Limits for tasks assessed                         Exercises General Exercises - Lower Extremity Ankle Circles/Pumps: AROM;Both;5 reps;Seated           Pertinent Vitals/  Pain       Pain Assessment: Faces Faces Pain Scale: Hurts a little bit Pain Location: R Knee Pain Descriptors / Indicators: Aching Pain Intervention(s): Limited activity within patient's tolerance;Monitored during session;Repositioned;Relaxation         Frequency Min 2X/week     Progress Toward Goals  OT Goals(current goals can now be found in the care plan section)  Progress towards OT goals: Progressing toward goals  Acute Rehab OT Goals Patient Stated Goal: to get back to moving and doing things on my own  Plan Discharge plan remains appropriate    Co-evaluation                 End of Session Equipment Utilized During Treatment: Rolling walker;Gait belt   Activity Tolerance Patient tolerated treatment well   Patient Left in chair;with call bell/phone within reach;with family/visitor present   Nurse Communication          Time: 0539-7673 OT Time Calculation (min): 22 min  Charges: OT General Charges $OT Visit: 1 Procedure OT Treatments $Self Care/Home Management : 8-22 mins  Villa Herb M 07/14/2014, 9:54 AM  Cyndie Chime, OTR/L Occupational Therapist [phone] (pager)

## 2014-07-15 DIAGNOSIS — Z48812 Encounter for surgical aftercare following surgery on the circulatory system: Secondary | ICD-10-CM

## 2014-07-15 LAB — BASIC METABOLIC PANEL
ANION GAP: 3 — AB (ref 5–15)
BUN: 15 mg/dL (ref 6–23)
CALCIUM: 7.9 mg/dL — AB (ref 8.4–10.5)
CO2: 29 mmol/L (ref 19–32)
Chloride: 107 mmol/L (ref 96–112)
Creatinine, Ser: 1.33 mg/dL (ref 0.50–1.35)
GFR calc Af Amer: 59 mL/min — ABNORMAL LOW (ref >90)
GFR, EST NON AFRICAN AMERICAN: 51 mL/min — AB (ref >90)
GLUCOSE: 94 mg/dL (ref 70–99)
Potassium: 3.2 mmol/L — ABNORMAL LOW (ref 3.5–5.1)
Sodium: 139 mmol/L (ref 135–145)

## 2014-07-15 LAB — CBC
HEMATOCRIT: 33.4 % — AB (ref 39.0–52.0)
HEMOGLOBIN: 10.5 g/dL — AB (ref 13.0–17.0)
MCH: 25.6 pg — ABNORMAL LOW (ref 26.0–34.0)
MCHC: 31.4 g/dL (ref 30.0–36.0)
MCV: 81.5 fL (ref 78.0–100.0)
Platelets: 231 10*3/uL (ref 150–400)
RBC: 4.1 MIL/uL — AB (ref 4.22–5.81)
RDW: 19.6 % — AB (ref 11.5–15.5)
WBC: 7.9 10*3/uL (ref 4.0–10.5)

## 2014-07-15 MED ORDER — PANTOPRAZOLE SODIUM 40 MG PO TBEC: 40.0000 mg | DELAYED_RELEASE_TABLET | Freq: Every day | ORAL | Status: DC

## 2014-07-15 MED ORDER — ELIQUIS 5 MG PO TABS: 5.0000 mg | ORAL_TABLET | Freq: Two times a day (BID) | ORAL | Status: DC

## 2014-07-15 MED ORDER — METOPROLOL TARTRATE 50 MG PO TABS: 50.0000 mg | ORAL_TABLET | Freq: Two times a day (BID) | ORAL | Status: DC

## 2014-07-15 MED ORDER — ENSURE ENLIVE PO LIQD: 237.0000 mL | Freq: Two times a day (BID) | ORAL | Status: DC

## 2014-07-15 MED ORDER — POTASSIUM CHLORIDE CRYS ER 20 MEQ PO TBCR
40.0000 meq | EXTENDED_RELEASE_TABLET | Freq: Once | ORAL | Status: AC
  Administered 2014-07-15: 40 meq via ORAL
  Filled 2014-07-15: qty 2

## 2014-07-15 MED ORDER — AMIODARONE HCL 200 MG PO TABS: 200.0000 mg | ORAL_TABLET | Freq: Every day | ORAL | Status: DC

## 2014-07-15 MED ORDER — FUROSEMIDE 40 MG PO TABS: 40.0000 mg | ORAL_TABLET | Freq: Every day | ORAL | Status: DC

## 2014-07-15 MED ORDER — POTASSIUM CHLORIDE CRYS ER 20 MEQ PO TBCR: 40.0000 meq | EXTENDED_RELEASE_TABLET | Freq: Every day | ORAL | Status: DC

## 2014-07-15 MED ORDER — POTASSIUM CHLORIDE CRYS ER 20 MEQ PO TBCR
40.0000 meq | EXTENDED_RELEASE_TABLET | Freq: Every day | ORAL | Status: DC
  Administered 2014-07-15: 40 meq via ORAL
  Filled 2014-07-15: qty 2

## 2014-07-15 NOTE — Progress Notes (Addendum)
Per discussion with Dr. Cyndia Bent, Joe Kennedy is scheduled to have dental surgery next week. Dr. Cyndia Bent states Joe Kennedy needs to be off anticoagulation 5 days prior to both dental and heart surgery;otherwise, surgery will have to be postponed.  Agree with above. You may get away with stopping it 2 days before dental extraction but I will want it stopped 5 days before heart surgery. While the literature recommends stopping it 2 days before surgery ( since the half life is 12-15 hrs ) , when it is imperative that the clotting system be completely normal it should be stopped longer. I always insist on 5 days for elective heart surgery because I have seen significant periop bleeding with stopping it 2-3 days before and I am the one who has to deal with the consequences.

## 2014-07-15 NOTE — Significant Event (Signed)
Discharge instructions discussed with patient and wife.  All appointments and prescriptions reviewed, patient belongings gathered and at bedside.  No questions or concerns at this time.  Home health to follow up this evening at home, verified patient discharge with Home health.

## 2014-07-15 NOTE — Progress Notes (Signed)
Pt Profile:  74 y.o. male with a history of atrial fibrillation on Eliquis, CAD, HLD, chronic systolic heart failure (EF 35%), chronic anemia, and HTN who presented to [facility] with a syncopal episode on 07/04/2014. He eventually ruled in for NSTEMI and was transferred to [facility] today for evaluation by [facility].  He is followed by Dr. Clayborn Bigness for cardiology. He was hospitalized earlier in March with angina, NSTEMI and had a cardiac catheterization, which showed two-vessel disease not amenable to intervention. Medical management was recommended. He was sent home on medical therapy.   Now admitted with syncope and bacteremia  Subjective: No chest pain. Stable dyspnea. 1.5L negative yesterday.   Objective: Vital signs in last 24 hours: Temp:  [98.1 F (36.7 C)-98.3 F (36.8 C)] 98.1 F (36.7 C) (04/09 0500) Pulse Rate:  [75-90] 90 (04/09 0500) Resp:  [16-18] 18 (04/09 0500) BP: (136-153)/(47-55) 136/47 mmHg (04/09 0500) SpO2:  [97 %-98 %] 98 % (04/09 0500) Weight:  [181 lb 1.6 oz (82.146 kg)] 181 lb 1.6 oz (82.146 kg) (04/09 0500) Weight change: -1 lb 3.5 oz (-0.554 kg) Last BM Date: 07/15/14 Intake/Output from previous day: 04/08 0701 - 04/09 0700 In: 720 [P.O.:720] Out: 1250 [Urine:1250] Intake/Output this shift: Total I/O In: 390 [P.O.:240; IV Piggyback:150] Out: 150 [Urine:150]  PE: General:Pleasant affect, NAD, though appears ill Skin:Warm and dry, brisk capillary refill HEENT:normocephalic, sclera clear, mucus membranes moist Heart:S1S2 RRR with 2-6/9 systolic murmur, no gallup, rub or click Lungs:clear without rales, rhonchi, or wheezes SWN:IOEV, non tender, + BS, do not palpate liver spleen or masses Ext:no lower ext edema, 2+ pedal pulses, 2+ radial pulses Neuro:alert and oriented, MAE, follows commands, + facial symmetry  Tele:  SR with PVCs on occ.    Lab Results:  Recent Labs  07/14/14 0328 07/15/14 0346  WBC 8.8 7.9  HGB 10.7* 10.5*  HCT 33.8*  33.4*  PLT 232 231   BMET  Recent Labs  07/14/14 0328 07/15/14 0346  NA 141 139  K 3.8 3.2*  CL 112 107  CO2 25 29  GLUCOSE 90 94  BUN 12 15  CREATININE 1.37* 1.33  CALCIUM 8.2* 7.9*    Recent Labs  07/13/14 1039 07/13/14 1603  TROPONINI 0.71* 0.71*   Lab Results  Component Value Date   CHOL 82 07/07/2014   HDL 18* 07/07/2014   LDLCALC 43 07/07/2014   TRIG 103 07/07/2014   CHOLHDL 4.6 07/07/2014   Lab Results  Component Value Date   HGBA1C 5.9* 07/06/2014     Lab Results  Component Value Date   TSH 2.343 07/06/2014   Studies/Results: ECHO Left ventricle: The cavity size was mildly dilated. There was mild concentric hypertrophy. Systolic function was normal. The estimated ejection fraction was in the range of 50% to 55%. Features are consistent with a pseudonormal left ventricular filling pattern, with concomitant abnormal relaxation and increased filling pressure (grade 2 diastolic dysfunction). - Regional wall motion abnormality: Mild hypokinesis of the mid-apical anterior, mid anterolateral, apical lateral, and apical myocardium. - Aortic valve: Trileaflet; mildly thickened, mildly calcified leaflets. There was no stenosis. There was moderate to severe regurgitation. - Mitral valve: Mildly thickened leaflets . There was mild regurgitation. - Left atrium: The atrium was mildly dilated. Volume/bsa, ES, (1-plane Simpson&'s, A2C): 30.1 ml/m^2. - Right ventricle: Systolic function was mildly reduced. - Right atrium: The atrium was mildly dilated. - Tricuspid valve: There was moderate-severe regurgitation. - Pulmonary arteries: PA peak pressure: 69 mm Hg (S). Severely  elevated pulmonary pressures. - Inferior vena cava: The vessel was dilated. The respirophasic diameter changes were blunted (< 50%), consistent with elevated central venous pressure.  Medications: I have reviewed the patient's current medications. Scheduled  Meds: . amiodarone  200 mg Oral Daily  . apixaban  5 mg Oral BID  . digoxin  125 mcg Oral Daily  . feeding supplement (ENSURE ENLIVE)  237 mL Oral BID BM  . furosemide  40 mg Oral Daily  . metoprolol tartrate  25 mg Oral BID  . pantoprazole (PROTONIX) IV  40 mg Intravenous Q12H  . potassium chloride  40 mEq Oral Daily  . saccharomyces boulardii  250 mg Oral BID  . sodium chloride  3 mL Intravenous Q12H  . vancomycin  750 mg Intravenous Q12H   Continuous Infusions:   PRN Meds:.sodium chloride, acetaminophen, calcium carbonate, nitroGLYCERIN, ondansetron (ZOFRAN) IV, sodium chloride, sodium chloride  Assessment/Plan:  Principal Problem:   Viridans streptococci infection- plan for TEE for 07/11/14 OK per cards for diet today on Levaquin  Active Problems: 1. Viridans streptococci infection, Acute and subacute infective endocarditis in diseases classified elsewhere  He needs AVR/CABG, patient has been off anticoagulation secondary to recent GI bleed and is too high risk for colonoscopy. Patient will need several weeks of IV antibiotics through 08/04/2014. Cardiothoracic surgeon - Dr Cyndia Bent and ID following. Anticoagulation has been resumed, it will need  To be discontinued 2 days (not 5 days ) prior to scheduled dental work.   2. NSTEMI (non-ST elevated myocardial infarction)  Pk troponin prior to transfer 11-  Dr. Clayborn Bigness recommended CABG  medical management deep t wave inversions lat. On IV Heparin --reviewed by Drs Angelena Form and Nahser, their evaluation: The LAD has moderate disease but is not seen very well ijn all views. The LCx has a tight stenosis but is not a good target for CABG - it primarily fills a small , subtotalled OM ( that already has collateral flow). The RCA has distal disease and could potentially be stented  -on BB, no ACE due to AKI at [facility] now with Cr 1.28    3. Atrial fibrillation- maintaining SR here was on eliquis and on amiodarone [id number] score 4- on  Eliquis 5 mg BID - this would need to be held 2 days prior to his dental procedure. I would increase metoprolol to 50 mg PO BID.    4. Chronic systolic CHF (congestive heart failure) EF 35%-- now with recent Echo EF 50-55% with G2DD moderate to severe AI, mild MR mod to severe TR and PA pk pressure 69 mmHg. Back on lasix 40 mg daily - good diuresis yesteday - continue today, may be able to decrease to 20 mg daily tomorrow for maintenance.  He can be discharged today, he will need early follow up in our clinic (within 1 week), we are increasing metoprolol to 50 mg PO BID, continue current dose of lasix and KCl replacement, give additional KCl befoore discharge, we are holding his prior home meds - imdur and lisinopril. We will arrange for follow up appointment.    LOS: 9 days  Dorothy Spark, MD 07/15/2014, 12:15 PM

## 2014-07-15 NOTE — Discharge Summary (Signed)
Physician Discharge Summary  Joe Kennedy XKG:[id number] DOB: Jul 22, 1940 DOA: 07/06/2014  PCP: Otilio Miu, MD  Admit date: 07/06/2014 Discharge date: 07/15/2014  Time spent: 70 minutes  Recommendations for Outpatient Follow-up:  1. Follow-up with Dr. Enrique Sack of dentistry on 07/20/2014 for dental extraction. Patient has been advised orders anticoagulation 5 days prior. 2. Patient is to follow-up with Dr. Cyndia Bent of cardiothoracic surgery as outpatient 08/02/2014 to decide on scheduling of surgery. 3. Patient will follow-up with Dr. Tommy Medal of infectious disease 07/26/2014 as outpatient. 4. Patient will follow-up with Otilio Miu, MD in 2 weeks. Follow-up patient in need a basic metabolic profile done to follow-up on electrolytes and renal function. Patient also need a CBC done to follow-up on H&H. 5. Patient is to follow-up with Dr. Debara Pickett of cardiology in 1 week.   Discharge Diagnoses:  Principal Problem:   Viridans streptococci infection Active Problems:   Acute and subacute infective endocarditis in diseases classified elsewhere   NSTEMI (non-ST elevated myocardial infarction)   Bacteremia   Atrial fibrillation   Chronic systolic CHF (congestive heart failure)   Right lower lobe pneumonia   Protein-calorie malnutrition, severe   Unintentional weight loss   Acute pain of right knee   Normocytic anemia   Hypertension   Coronary artery disease   Dyslipidemia   Knee pain, acute   Aortic regurgitation   Poor dentition   Severe aortic insufficiency   Discharge Condition: Stable and improved  Diet recommendation: Heart healthy  Filed Weights   07/13/14 0456 07/14/14 0520 07/15/14 0500  Weight: 84.596 kg (186 lb 8 oz) 82.7 kg (182 lb 5.1 oz) 82.146 kg (181 lb 1.6 oz)    History of present illness:  Per Dr. Kallie Locks is a 74 y.o. male with a history of atrial fibrillation on Eliquis, CAD, HLD, chronic systolic heart failure (EF 35%), chronic anemia, and HTN  who presented to [facility] with a syncopal episode on 07/04/2014. He eventually ruled in for NSTEMI and was transferred to [facility] for evaluation by [place]. Patient was initially admitted to the cardiology service.  He is followed by Dr. Clayborn Bigness for cardiology. He was hospitalized earlier in March with angina, NSTEMI and had a cardiac catheterization, which showed two-vessel disease not amenable to intervention. Medical management was recommended. He was sent home on medical therapy.   He re-presented to [facility] on 07/04/14 with syncope while at his PCP's office. This hospitalization was further complicated by hypotension, right lower lobe pneumonia, acute hypoxic respiratory failure requiring BiPAP, acute renal failure, bacteremia ( gram + cocci), afib with RVR and NSTEMI. During his admission he developed further chest pain and his troponin was noted to be elevated and peaked around 11. His cardiologist, Dr. Clayborn Bigness, was notified and he recommended CABG. He was transferred to [facility] for further evaluation. He was transferred on IV heparin and IV Vanc.   He was not having any chest pain and was feeling better, on admission. History was difficult to obtain as he was given a xanax before transfer which made him quite sedated. No SOB, orthopnea, PND or LE edema.   Hospital Course:  #1 viridans group streptococcal bacteremia and endocarditis Patient was noted to have a viridans group streptococcal bacteremia and endocarditis. ID was consulted. Patient was placed empirically on IV vancomycin. Patient will need at least 4 weeks of IV vancomycin as per ID recommendations. She remained afebrile. WBC normalized. Patient was seen in consultation by cardiothoracic surgery for further  evaluation of endocarditis and aortic valve and severe aortic valvular regurgitation. Dr. Lawana Chambers of dentistry, has also assessed patient and recommending extraction of all teeth except for 1 complex 1. Patient is  scheduled to the operating room on 07/20/2014 for extraction of his teeth. Patient will also subsequently follow-up with cardiothoracic surgery as outpatient. Patient has been instructed to hold his anticoagulation for at least 5 days prior to his dental and cardiothoracic surgery. Patient will continue on IV vancomycin through 08/03/2012. Patient will follow-up with ID as outpatient.  #2 right knee pain MRI of the right knee with tricompartmental osteoarthritis most advanced within the medial compartment. Small complex knee joint effusion no specific signs of joint infection osteomyelitis on noncontrast imaging. Extensive degenerative tearing of the medial meniscus. Anterior cruciate ligament mucoid degeneration. Pain management. Follow.  #3 severe aortic insufficiency/aortic valvular endocarditis Patient on workup of his bacteremia and non-STEMI was noted to have severe aortic insufficiency and aortic valvular endocarditis. TEE. Patient was placed empirically on IV antibiotics and ID consulted. Patient also noted to have a NSTEMI. Consulted with cardiothoracic surgery for further evaluation and management. Patient may need a aVR/CABG. Patient has been off anticoagulation secondary to recent GI bleed and is too high risk for colonoscopy. Patient will need several weeks of IV antibiotics through 08/04/2014 per ID recommendations. Anticoagulation was resumed. Cardiothoracic surgery recommended patient be seen by nutrition for his malnutrition and continue antibiotic treatment for his endocarditis while his dental workup and treatment is done. Patient is scheduled to go to the operating room to have extraction of the teeth by dentistry on 07/20/2014. Patient will subsequently follow-up with cardiothoracic surgery as outpatient to decide when to schedule surgery. Patient has been instructed to hold his anticoagulation 5 days prior to his dental and cardiac procedures.   #4 GI bleed likely diverticular in  nature Patient during the hospitalization was noted to have a GI bleed and was seen in consultation by gastroenterology. IV heparin was discontinued. It was felt patient was too high risk for colonoscopy and patient likely has a diverticular bleed. Patient was monitored his bleeding subsided and resolved. Patient's hemoglobin remained stable. Patient's diet was advanced and he was tolerating a solid diet by day of discharge. Patient's anticoagulation was resumed and patient was monitored for 24 hours on anticoagulation and did not have any further bleeding. Patient will be discharging stable and improved condition.   #5 non-STEMI in the setting of sepsis Patient had presented to [facility] with syncope. Troponins were elevated at 11 the patient initially admitted under cardiology service. Patient's chest pain improved and did not have any further chest pain for most of the hospitalization. Troponin levels were 4.03, 3.46, 3.11. 2-D echo showed an EF of 55-60% with grade 2 diastolic dysfunction. Patient had EKG changes with T-wave inversions in leads 2 in the lateral leads documented in [facility] H&P. At [facility] within the last 2 weeks were reviewed by cardiology and patient noted to have LAD moderate disease. Left circumflex had a tight stenosis but not a good candidate for CABG. Subtotal OM. RCA had distal disease or could potentially be stented. Continued on current medical management. Heparin drip was initially started and then subsequently discontinued secondary to GI bleed. Aspirin has also been discontinued. CT surgery was consulted and patient was seen by Dr. Cyndia Bent, who will decide the timing of surgery. Patient will follow-up with cardiothoracic surgery as outpatient.  #6 chronic systolic heart failure Patient also noted  to have severe aortic insufficiency. TEE with a EF of 60% severe aortic regurgitation, severe tricuspid regurgitation,  vegetation on aortic valve. Continued on beta blocker. Lasix was resumed per cardiology with good diuresis. Patient will be discharged on Lasix 40 mg daily and is to follow-up with cardiology as outpatient.  #7 right lower lobe pneumonia Patient received a full course of antibiotic therapy during the hospitalization.   #8 atrial fibrillation Patient remained rate controlled during the hospitalization. Anticoagulation was discontinued during the earlier part of the hospitalization secondary to GI bleed. Patient was monitored and was felt that he had a diverticular bleed. Patient's bleeding resolved his hemoglobin stabilized and patient was subsequently started back on eliquis per cardiology due to concerns for increased risk for septic emboli and possible CVA . Anticoagulation is be held 5 days prior to future procedures, per recommendations from cardiothoracic surgery.  #9 hypokalemia Secondary to diuretics. Repleted.  #10 protein calorie malnutrition Patient was seen by the nutritionist and patient placed on nutritional supplementation.   Procedures:  MRI right knee 07/11/2014  2-D echo 07/08/2014  TEE 07/11/2014  Panorex 07/10/2014  Chest x-ray 07/06/2014  Consultations:  GI Dr. Benson Norway 07/11/2014  Cardiology: also cardiology service 07/06/2014  Infectious diseases Dr. Megan Salon  Cardiothoracic surgery: Dr. Cyndia Bent 07/12/2014  Dentist: Dr. Enrique Sack 07/13/2014  Discharge Exam: Filed Vitals:   07/15/14 0500  BP: 136/47  Pulse: 90  Temp: 98.1 F (36.7 C)  Resp: 18    General: NAD Cardiovascular: RRR Respiratory: CTAB  Discharge Instructions   Discharge Instructions    Diet - low sodium heart healthy    Complete by:  As directed      Discharge instructions    Complete by:  As directed   Hold Eliquis (apixaban) 5 days prior to dental surgery and heart surgery. Follow up with Dr Orene Desanctis of dental for tooth extraxction. Follow up with cardiology in 1 weeks.      Increase activity slowly    Complete by:  As directed           Current Discharge Medication List    START taking these medications   Details  feeding supplement, ENSURE ENLIVE, (ENSURE ENLIVE) LIQD Take 237 mLs by mouth 2 (two) times daily between meals. Qty: 237 mL, Refills: 12    furosemide (LASIX) 40 MG tablet Take 1 tablet (40 mg total) by mouth daily. Qty: 30 tablet, Refills: 0    pantoprazole (PROTONIX) 40 MG tablet Take 1 tablet (40 mg total) by mouth daily. Qty: 30 tablet, Refills: 0    potassium chloride SA (K-DUR,KLOR-CON) 20 MEQ tablet Take 2 tablets (40 mEq total) by mouth daily. Qty: 30 tablet, Refills: 0    Vancomycin (VANCOCIN) 750 MG/150ML SOLN Inject 150 mLs (750 mg total) into the vein every 12 (twelve) hours. Qty: 6300 mL, Refills: 0      CONTINUE these medications which have CHANGED   Details  amiodarone (PACERONE) 200 MG tablet Take 1 tablet (200 mg total) by mouth daily. Qty: 30 tablet, Refills: 0    ELIQUIS 5 MG TABS tablet Take 1 tablet (5 mg total) by mouth 2 (two) times daily. Hold 5 days prior to dental extraction and heart surgery. Qty: 60 tablet, Refills: 0    metoprolol tartrate (LOPRESSOR) 50 MG tablet Take 1 tablet (50 mg total) by mouth 2 (two) times daily. Qty: 62 tablet, Refills: 0      CONTINUE these medications which have NOT CHANGED   Details  digoxin (LANOXIN) 0.125  MG tablet Take 125 mcg by mouth daily. Refills: 6    traMADol (ULTRAM) 50 MG tablet Take 50 mg by mouth 3 (three) times daily as needed for moderate pain.    atorvastatin (LIPITOR) 40 MG tablet Take 40 mg by mouth daily. Refills: 0    NITROSTAT 0.4 MG SL tablet Take 0.4 mg by mouth. Dissolve 1 tablet under the tongue every 5 minutes as needed for chest pain up to 3 times. Refills: 0      STOP taking these medications     etodolac (LODINE) 500 MG tablet      isosorbide mononitrate (IMDUR) 30 MG 24 hr tablet      lisinopril (PRINIVIL,ZESTRIL) 10 MG tablet         Allergies  Allergen Reactions  . Lipitor [Atorvastatin] Other (See Comments)    Abdominal pain, nausea and chest pain  . Penicillins     Difficulty breathing   Follow-up Information    [facility] up with [facility] On 07/20/2014.   Why:  Teeth Extraction: Room 8 @ 9:15. Pre Op to call Thursday for arrival time.    Contact information:   [address] [place], [place] [address]      Follow up with Alcide Evener, MD. Go on 07/26/2014.   Specialty:  Infectious Diseases   Why:  hospital f/u @ 3:45pm    Contact information:   [address] [place] [address] [phone]       Follow up with Gaye Pollack, MD On 08/02/2014.   Specialty:  Cardiothoracic Surgery   Why:  Appointment time is at 3:00 pm   Contact information:   [place] [address] [phone]       Follow up with Pixie Casino, MD. Schedule an appointment as soon as possible for a visit in 1 week.   Specialty:  Cardiology   Why:  office will call with appointment.   Contact information:   [address] [place]  [address] [phone]       Follow up with Otilio Miu, MD. Schedule an appointment as soon as possible for a visit in 2 weeks.   Specialty:  Family Medicine   Contact information:   [place] [address] [phone]        The results of significant diagnostics from this hospitalization (including imaging, microbiology, ancillary and laboratory) are listed below for reference.    Significant Diagnostic Studies: Dg Orthopantogram  07/10/2014   CLINICAL DATA:  Bacteremia. Infection in the mouth. Symptoms for 2 months.  EXAM: ORTHOPANTOGRAM/PANORAMIC  COMPARISON:  None.  FINDINGS: Positioning is nonstandard. No evidence for acute fracture of the mandible. Majority of the maxillary teeth are absent. There is limited detail of the maxillary alveolar ridge, especially on the right side. Suspect  caries of the left mandibular lateral incisor possibly associated with periapical abscess. There is limited detail regarding the central mandibular teeth.  IMPRESSION: 1. Limited detail. 2. Numerous absent teeth. 3. Suspect significant caries and possible periapical abscess of the left lateral mandibular incisor.   Electronically Signed   By: Nolon Nations M.D.   On: 07/10/2014 21:44   Dg Chest 2 View  07/06/2014   CLINICAL DATA:  Pneumonia.  Follow-up radiograph.  EXAM: CHEST  2 VIEW  COMPARISON:  07/05/2014.  FINDINGS: Cardiopericardial silhouette is enlarged. Improving airspace disease is present of both lung bases. This can be seen on the  frontal projection in the retrocardiac region as well is in the RIGHT infrahilar region. Costophrenic angles are partially excluded on the frontal views. Small bilateral pleural effusions later posteriorly/dependently. Monitoring leads project over the chest.  IMPRESSION: 1. Unchanged mild cardiomegaly. 2. Improving roughly symmetric medial bilateral basilar airspace disease favored to represent either edema or aspiration pneumonitis over pneumonia.   Electronically Signed   By: Dereck Ligas M.D.   On: 07/06/2014 18:02   Mr Knee Right Wo Contrast  07/12/2014   CLINICAL DATA:  Recent acute onset of knee pain. Limited range of motion. No fluid aspirated on attempted arthrocentesis. Initial encounter.  EXAM: MRI OF THE RIGHT KNEE WITHOUT CONTRAST  TECHNIQUE: Multiplanar, multisequence MR imaging of the knee was performed. No intravenous contrast was administered.  COMPARISON:  None.  FINDINGS: MENISCI  Medial meniscus: There is extensive degenerative tearing throughout the medial meniscus, primarily within the posterior horn and body. The meniscus is largely extruded from the joint.  Lateral meniscus:  Diffuse degeneration without focal tear.  LIGAMENTS  Cruciates:  Intact.  There is mild ACL mucoid degeneration.  Collaterals:  Intact.  CARTILAGE  Patellofemoral:  Moderate patellofemoral degenerative changes with chondral thinning and osteophytes. No focal subchondral signal abnormality.  Medial: Advanced osteoarthritis with diffuse chondral thinning, osteophytes and subchondral eburnation.  Lateral: Moderate osteoarthritis with chondral thinning, surface irregularity and osteophytes.  Joint:  Minimal complex joint effusion.  Popliteal Fossa:  Unremarkable. No significant Baker's cyst.  Extensor Mechanism: Intact. There is moderate prepatellar subcutaneous edema without focal fluid collection. There is mild edema centrally in Hoffa's fat.  Bones: No significant extra-articular osseous findings. There is no cortical destruction or suspicious periarticular edema to suggest osteomyelitis.  IMPRESSION: 1. Tricompartmental osteoarthritis, most advanced within the medial compartment. 2. Small complex knee joint effusion. No specific signs of joint infection or osteomyelitis on noncontrast imaging. 3. Extensive degenerative tearing of the medial meniscus. 4. ACL mucoid degeneration.   Electronically Signed   By: Richardean Sale M.D.   On: 07/12/2014 08:41   Dg Fluoro Guide Ndl Plcd/bx/inj/loc  07/10/2014   CLINICAL DATA:  Right knee swelling  EXAM: RIGHT KNEE ASPIRATION UNDER FLUOROSCOPY  FLUOROSCOPY TIME:  Radiation Exposure Index (as provided by the fluoroscopic device): 2.3 microGy*m^2  PROCEDURE: PROCEDURE  I discussed the risks (including hemorrhage, infection, and allergic reaction, among others), benefits, and alternatives to the procedure with the patient. We specifically discussed the likelihood of success of the procedure. The patient understood and elected to undergo the procedure.  Standard time-out was employed. The suprapatellar bursal region was wrapped with a compression bandage in order to force fluid down into the joint. Following sterile skin prep and local anesthetic administration consisting of 1% lidocaine, an 18 gauge needle was advanced into the knee joint  from a medial approach below the patella. I obtained in image to show positioning. A total of 2 cc of sanguinous fluid was withdrawn. Despite repositioning so I was unable to get more fluid from the joint. The needle was subsequently removed and the skin cleansed and bandaged. No immediate complications were observed.  IMPRESSION: 1. Right knee arthrocentesis yielded 2 cc of sanguinous fluid. Despite repositioning side I was unable to get or joint fluid.   Electronically Signed   By: Van Clines M.D.   On: 07/10/2014 14:09    Microbiology: Recent Results (from the past 240 hour(s))  Culture, blood (routine x 2)     Status: None   Collection Time: 07/06/14  7:40  PM  Result Value Ref Range Status   Specimen Description BLOOD LEFT ANTECUBITAL  Final   Special Requests BOTTLES DRAWN AEROBIC AND ANAEROBIC 3CC EA  Final   Culture   Final    VIRIDANS STREPTOCOCCUS Note: Gram Stain Report Called to,Read Back By and Verified With: DEVON RN ON 3W AT 1412 [id number] BY CASTC Performed at [facility]    Report Status 07/11/2014 FINAL  Final  Culture, blood (routine x 2)     Status: None   Collection Time: 07/06/14  7:50 PM  Result Value Ref Range Status   Specimen Description BLOOD LEFT HAND  Final   Special Requests BOTTLES DRAWN AEROBIC ONLY 2CC  Final   Culture   Final    NO GROWTH 5 DAYS Performed at [facility]    Report Status 07/13/2014 FINAL  Final  Culture, blood (routine x 2)     Status: None   Collection Time: 07/07/14 10:46 AM  Result Value Ref Range Status   Specimen Description BLOOD LEFT ARM  Final   Special Requests BOTTLES DRAWN AEROBIC AND ANAEROBIC 10CC EACH  Final   Culture   Final    NO GROWTH 5 DAYS Performed at [facility]    Report Status 07/13/2014 FINAL  Final  Culture, blood (routine x 2)     Status: None   Collection Time: 07/07/14 10:53 AM  Result Value Ref Range Status   Specimen Description BLOOD LEFT HAND  Final   Special  Requests BOTTLES DRAWN AEROBIC AND ANAEROBIC 10CC EACH  Final   Culture   Final    NO GROWTH 5 DAYS Performed at [facility]    Report Status 07/13/2014 FINAL  Final  Clostridium Difficile by PCR     Status: None   Collection Time: 07/09/14 12:42 AM  Result Value Ref Range Status   C difficile by pcr NEGATIVE NEGATIVE Final  Body fluid culture     Status: None   Collection Time: 07/10/14  2:00 PM  Result Value Ref Range Status   Specimen Description FLUID KNEE RIGHT SYNOVIAL  Final   Special Requests NONE  Final   Gram Stain   Final    RARE WBC PRESENT,BOTH PMN AND MONONUCLEAR NO ORGANISMS SEEN Performed at [facility]    Culture   Final    NO GROWTH 3 DAYS Performed at [facility]    Report Status 07/14/2014 FINAL  Final     Labs: Basic Metabolic Panel:  Recent Labs Lab 07/10/14 0530 07/11/14 1143 07/13/14 0415 07/14/14 0328 07/15/14 0346  NA 138 139 141 141 139  K 4.4 4.4 4.4 3.8 3.2*  CL 109 111 112 112 107  CO2 22 25 21 25 29   GLUCOSE 91 91 100* 90 94  BUN 13 15 13 12 15   CREATININE 1.28 1.35 1.34 1.37* 1.33  CALCIUM 8.2* 8.0* 8.3* 8.2* 7.9*  MG  --   --  1.9  --   --    Liver Function Tests:  Recent Labs Lab 07/09/14 0458 07/10/14 0530  AST 35 33  ALT 26 24  ALKPHOS 72 69  BILITOT 0.5 0.6  PROT 5.9* 5.6*  ALBUMIN 2.1* 2.0*   No results for input(s): LIPASE, AMYLASE in the last 168 hours. No results for input(s): AMMONIA in the last 168 hours. CBC:  Recent Labs Lab 07/11/14 1143 07/12/14 0402 07/13/14 0415 07/14/14 0328 07/15/14 0346  WBC 8.1 9.1 8.7 8.8 7.9  HGB 8.9*  10.1* 9.9* 10.7* 10.5*  HCT 28.6* 32.2* 31.9* 33.8* 33.4*  MCV 81.0 80.7 81.4 80.9 81.5  PLT 282 264 240 232 231   Cardiac Enzymes:  Recent Labs Lab 07/13/14 0415 07/13/14 1039 07/13/14 1603  TROPONINI 0.77* 0.71* 0.71*   BNP: BNP (last 3 results)  Recent Labs  07/06/14 1940  BNP 779.9*    ProBNP (last 3 results) No results for  input(s): PROBNP in the last 8760 hours.  CBG: No results for input(s): GLUCAP in the last 168 hours.     SignedIrine Seal M.D. [facility] 07/15/2014, 1:21 PM

## 2014-07-17 ENCOUNTER — Telehealth: Payer: Self-pay | Admitting: Cardiovascular Disease

## 2014-07-17 NOTE — Telephone Encounter (Signed)
New message    tcm appt on  4.19.2016 at 10 am with Tera Helper.   Per after hour voice mail. - from [place].

## 2014-07-17 NOTE — Telephone Encounter (Signed)
Patient discharged over the weekend  He needs follow-up within the next 7 days (TCM)  Please arrange on a day that Dr. Acie Fredrickson is in the office  Richardson Dopp, PA-C   07/16/2014 1:58 PM    Patient contacted regarding discharge from [facility] on 07/15/14.  Patient understands to follow up with provider Truitt Merle, NP on 4/19 at 10:00 at [facility]. Patient understands discharge instructions? Yes Patient understands medications and regiment? Yes Patient understands to bring all medications to this visit? Yes  Patient reports he is feeling well; denies questions or concerns.  I advised patient to call back with questions or concerns before 4/19.  Patient verbalized understanding and agreement.

## 2014-07-19 ENCOUNTER — Ambulatory Visit (HOSPITAL_COMMUNITY): Payer: Self-pay | Admitting: Dentistry

## 2014-07-19 ENCOUNTER — Encounter (HOSPITAL_COMMUNITY)
Admission: RE | Admit: 2014-07-19 | Discharge: 2014-07-19 | Disposition: A | Discharge status: Home / Self Care | Payer: Commercial Managed Care - HMO | Source: Ambulatory Visit | Attending: Dentistry | Admitting: Dentistry

## 2014-07-19 ENCOUNTER — Encounter (HOSPITAL_COMMUNITY): Payer: Self-pay

## 2014-07-19 VITALS — BP 139/30 | HR 57 | Temp 97.7°F

## 2014-07-19 DIAGNOSIS — K036 Deposits [accretions] on teeth: Secondary | ICD-10-CM

## 2014-07-19 DIAGNOSIS — M264 Malocclusion, unspecified: Secondary | ICD-10-CM

## 2014-07-19 DIAGNOSIS — I350 Nonrheumatic aortic (valve) stenosis: Secondary | ICD-10-CM | POA: Diagnosis not present

## 2014-07-19 DIAGNOSIS — I39 Endocarditis and heart valve disorders in diseases classified elsewhere: Secondary | ICD-10-CM

## 2014-07-19 DIAGNOSIS — Z01818 Encounter for other preprocedural examination: Secondary | ICD-10-CM

## 2014-07-19 DIAGNOSIS — Z888 Allergy status to other drugs, medicaments and biological substances status: Secondary | ICD-10-CM | POA: Diagnosis not present

## 2014-07-19 DIAGNOSIS — K083 Retained dental root: Secondary | ICD-10-CM

## 2014-07-19 DIAGNOSIS — K011 Impacted teeth: Secondary | ICD-10-CM

## 2014-07-19 DIAGNOSIS — D649 Anemia, unspecified: Secondary | ICD-10-CM | POA: Diagnosis not present

## 2014-07-19 DIAGNOSIS — K047 Periapical abscess without sinus: Secondary | ICD-10-CM | POA: Diagnosis not present

## 2014-07-19 DIAGNOSIS — I5022 Chronic systolic (congestive) heart failure: Secondary | ICD-10-CM | POA: Diagnosis not present

## 2014-07-19 DIAGNOSIS — J189 Pneumonia, unspecified organism: Secondary | ICD-10-CM | POA: Diagnosis not present

## 2014-07-19 DIAGNOSIS — E785 Hyperlipidemia, unspecified: Secondary | ICD-10-CM | POA: Diagnosis not present

## 2014-07-19 DIAGNOSIS — I2510 Atherosclerotic heart disease of native coronary artery without angina pectoris: Secondary | ICD-10-CM | POA: Diagnosis not present

## 2014-07-19 DIAGNOSIS — I252 Old myocardial infarction: Secondary | ICD-10-CM | POA: Diagnosis not present

## 2014-07-19 DIAGNOSIS — I10 Essential (primary) hypertension: Secondary | ICD-10-CM | POA: Diagnosis not present

## 2014-07-19 DIAGNOSIS — K045 Chronic apical periodontitis: Secondary | ICD-10-CM | POA: Insufficient documentation

## 2014-07-19 DIAGNOSIS — I339 Acute and subacute endocarditis, unspecified: Secondary | ICD-10-CM

## 2014-07-19 DIAGNOSIS — K029 Dental caries, unspecified: Secondary | ICD-10-CM

## 2014-07-19 DIAGNOSIS — I4891 Unspecified atrial fibrillation: Secondary | ICD-10-CM | POA: Diagnosis not present

## 2014-07-19 DIAGNOSIS — K0530 Chronic periodontitis, unspecified: Secondary | ICD-10-CM | POA: Diagnosis not present

## 2014-07-19 DIAGNOSIS — I351 Nonrheumatic aortic (valve) insufficiency: Secondary | ICD-10-CM | POA: Diagnosis not present

## 2014-07-19 DIAGNOSIS — I38 Endocarditis, valve unspecified: Secondary | ICD-10-CM | POA: Diagnosis not present

## 2014-07-19 DIAGNOSIS — Z880 Allergy status to penicillin: Secondary | ICD-10-CM | POA: Diagnosis not present

## 2014-07-19 HISTORY — DX: Gastro-esophageal reflux disease without esophagitis: K21.9

## 2014-07-19 HISTORY — DX: Presence of other vascular implants and grafts: Z95.828

## 2014-07-19 HISTORY — DX: Pneumonia, unspecified organism: J18.9

## 2014-07-19 HISTORY — DX: Cardiac murmur, unspecified: R01.1

## 2014-07-19 LAB — CBC
HCT: 38.8 % — ABNORMAL LOW (ref 39.0–52.0)
Hemoglobin: 12.4 g/dL — ABNORMAL LOW (ref 13.0–17.0)
MCH: 26.3 pg (ref 26.0–34.0)
MCHC: 32 g/dL (ref 30.0–36.0)
MCV: 82.4 fL (ref 78.0–100.0)
PLATELETS: 228 10*3/uL (ref 150–400)
RBC: 4.71 MIL/uL (ref 4.22–5.81)
RDW: 19.9 % — ABNORMAL HIGH (ref 11.5–15.5)
WBC: 11.3 10*3/uL — ABNORMAL HIGH (ref 4.0–10.5)

## 2014-07-19 LAB — BASIC METABOLIC PANEL
Anion gap: 9 (ref 5–15)
BUN: 18 mg/dL (ref 6–23)
CO2: 23 mmol/L (ref 19–32)
CREATININE: 1.58 mg/dL — AB (ref 0.50–1.35)
Calcium: 9 mg/dL (ref 8.4–10.5)
Chloride: 104 mmol/L (ref 96–112)
GFR, EST AFRICAN AMERICAN: 48 mL/min — AB (ref >90)
GFR, EST NON AFRICAN AMERICAN: 41 mL/min — AB (ref >90)
Glucose, Bld: 114 mg/dL — ABNORMAL HIGH (ref 70–99)
POTASSIUM: 4.5 mmol/L (ref 3.5–5.1)
Sodium: 136 mmol/L (ref 135–145)

## 2014-07-19 MED ORDER — VANCOMYCIN HCL IN DEXTROSE 750-5 MG/150ML-% IV SOLN
750.0000 mg | Freq: Once | INTRAVENOUS | Status: AC
  Administered 2014-07-20: 1000 mg via INTRAVENOUS
  Filled 2014-07-19: qty 150

## 2014-07-19 NOTE — Progress Notes (Signed)
   07/19/14 1516  OBSTRUCTIVE SLEEP APNEA  Have you ever been diagnosed with sleep apnea through a sleep study? No  Do you snore loudly (loud enough to be heard through closed doors)?  1  Do you often feel tired, fatigued, or sleepy during the daytime? 0  Has anyone observed you stop breathing during your sleep? 1  Do you have, or are you being treated for high blood pressure? 1  BMI more than 35 kg/m2? 0  Age over 75 years old? 1  Neck circumference greater than 40 cm/16 inches? 0 (15.5)  Gender: 1  Obstructive Sleep Apnea Score 5

## 2014-07-19 NOTE — Progress Notes (Signed)
Office called for permit order

## 2014-07-19 NOTE — Pre-Procedure Instructions (Addendum)
Joe Kennedy  07/19/2014   Your procedure is scheduled on:  07/20/14  Report to [facility] cone short stay admitting at 530 AM.  Call this number if you have problems the morning of surgery: [phone]   Remember:   Do not eat food or drink liquids after midnight.   Take these medicines the morning of surgery with A SIP OF WATER: amioderone, digoxin, metoprolol, nitro if neded, protonix, pain med if needed  STOP all herbel meds, nsaids (aleve,naproxen,advil,ibuprofen) now including vitamins     STOP eliquis per dr    Lazaro Arms not wear jewelry, make-up or nail polish.  Do not wear lotions, powders, or perfumes. You may wear deodorant.  Do not shave 48 hours prior to surgery. Men may shave face and neck.  Do not bring valuables to the hospital.  [facility] is not responsible                  for any belongings or valuables.               Contacts, dentures or bridgework may not be worn into surgery.  Leave suitcase in the car. After surgery it may be brought to your room.  For patients admitted to the hospital, discharge time is determined by your                treatment team.               Patients discharged the day of surgery will not be allowed to drive  home.  Name and phone number of your driver:   Special Instructions:  Special Instructions: [place] - Preparing for Surgery  Before surgery, you can play an important role.  Because skin is not sterile, your skin needs to be as free of germs as possible.  You can reduce the number of germs on you skin by washing with CHG (chlorahexidine gluconate) soap before surgery.  CHG is an antiseptic cleaner which kills germs and bonds with the skin to continue killing germs even after washing.  Please DO NOT use if you have an allergy to CHG or antibacterial soaps.  If your skin becomes reddened/irritated stop using the CHG and inform your nurse when you arrive at Short Stay.  Do not shave (including legs and underarms) for at least 48 hours  prior to the first CHG shower.  You may shave your face.  Please follow these instructions carefully:   1.  Shower with CHG Soap the night before surgery and the morning of Surgery.  2.  If you choose to wash your hair, wash your hair first as usual with your normal shampoo.  3.  After you shampoo, rinse your hair and body thoroughly to remove the Shampoo.  4.  Use CHG as you would any other liquid soap.  You can apply chg directly  to the skin and wash gently with scrungie or a clean washcloth.  5.  Apply the CHG Soap to your body ONLY FROM THE NECK DOWN.  Do not use on open wounds or open sores.  Avoid contact with your eyes ears, mouth and genitals (private parts).  Wash genitals (private parts)       with your normal soap.  6.  Wash thoroughly, paying special attention to the area where your surgery will be performed.  7.  Thoroughly rinse your body with warm water from the neck down.  8.  DO NOT shower/wash with your normal soap after using  and rinsing off the CHG Soap.  9.  Pat yourself dry with a clean towel.            10.  Wear clean pajamas.            11.  Place clean sheets on your bed the night of your first shower and do not sleep with pets.  Day of Surgery  Do not apply any lotions/deodorants the morning of surgery.  Please wear clean clothes to the hospital/surgery center.   Please read over the following fact sheets that you were given: Pain Booklet, Coughing and Deep Breathing and Surgical Site Infection Prevention

## 2014-07-19 NOTE — Progress Notes (Signed)
Anesthesia PAT Evaluation:  Patient is a 74 year old male scheduled for multiple teeth extractions with alveoloplasty tomorrow by Dr. Orene Desanctis. PAT is scheduled for 3PM today.  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 He initially presented to [facility] with syncope and ruled in for NSTEMI.  CABG was recommended, so he was transferred to [facility] for further evaluation. Other history includes non-smoker, HTN.  Cardiologist Dr. Debara Pickett, although recent H&P mentions Dr. Clayborn Bigness. CT surgeon Dr. Cyndia Bent. ID Dr. Tommy Medal. PCP Dr. Otilio Miu.  Meds include amiodarone, digoxin, Eliquis (on hold pre-opreatively), Lasix, Lopressor, Nitro, Protonix, KCl, tramadol, Vancomycin IV thru 08/04/14.   07/19/14 EKG: SB at 59 bpm, incomplete left BBB. ST/T wave abnormality, consider lateral ischemia. Afib has resolved since his 07/15/14 EKG. Lateral T wave abnormality persists.   07/11/14 TEE (Dr. Johnsie Cancel): Mild LVE EF 60% Trileaflet Aortic valve with severe AR. Appears to be "shaggy" with small vegetations on leaflets that are thickened Mild MR  Severe TR elevated PA pressure PA 74 mmHg  Normal PV No effusion No LAA thrombus LAE No aortic debris Recommend Rx SBE and either f/u TEE or consideration for AVR given severe AR. Patient should have propofol for future TEE;s as probe was Very difficult to place  06/22/14 Cardiac cath [facility]): Severe anterolateral hypokinesis, severe apical hypokinesis, moderate diaphragmatic hypokinesis. Global LVF was moderately depressed. EF estimated at 35%. Right dominant. Mid LAD 25%. D1 50%. Proximal CX 25%. Mid CX 99%. Distal CX 70%. OM2 100%. Proximal RCA 50%. RPLA 75%. PLA2  50%.  According to Dr. Hayden Pedro consult note, "He has moderate CAD with no significant stenosis in the LAD but a significant antero-apical wall motion abnormality on LV gram. That could be due to embolic disease from the endocarditis. The OM is occluded but small and probably not graftable. The distal RCA has significant disease that could be grafted."  07/13/14 PFTs: FVC 2.21 (48%), FEV1 1.75 (52%), DLCOunc 11.63 (33%). Preliminary report: Moderately severe obstructive airway disease. Severe restriction - interstitial. Severe diffusion defect.  07/06/14 CXR: IMPRESSION: 1. Unchanged mild cardiomegaly. 2. Improving roughly symmetric medial bilateral basilar airspace disease favored to represent either edema or aspiration pneumonitis over pneumonia.  Preoperative labs noted. H/H up to 12.4/38.8. WBC elevated at 11.3.  Cr 1.58 (up from 1.3-14.), BUN 18. Glucose 114.  Patient is feeling better since discharge from the hospital. He was told endocarditis was likely from his teeth.  No fevers, SOB at rest or chest pain.  No new edema, dizziness, syncope. He gets SOB with walking through his house, but not as much since his discharge. No home O2. His appetite is improving. He has a RUE PICC. Exam shows a frail, pleasant Caucasian male in NAD.  He is sitting in a hospital wheelchair. Heart sounds are regular, somewhat distant with soft SEM. Lungs clear.  No ankle edema. BP 121/27, 120/22, HR 56.    Reviewed above with anesthesiologist Dr. Linna Caprice. BP is low, particularly DBP.  Patient is asymptomatic.  Will have him hold metoprolol in the morning until his BP is assessed. If is BP is reasonable/felt acceptable for b-blocker administration then can be given intravenously.  Further evaluation by his assigned anesthesiologist on arrival tomorrow morning.  Myra Gianotti, PA-C [facility] Short Stay  Center/Anesthesiology Phone [phone] 07/19/2014 4:52 PM

## 2014-07-19 NOTE — Progress Notes (Signed)
07/19/2014  Patient:            Joe Kennedy Date of Birth:  10/22/1940 MRN:                [id number]   BP 139/30 mmHg  Pulse 57  Temp(Src) 97.7 F (36.5 C) (Oral)  Joe Kennedy is a 74 year old male was evaluated as an inpatient for history of poor dentition. Patient was scheduled for operating room procedure for 07/20/2014 at 7:30 am as an outpatient. Patient now presents for limited oral examination and dental radiographs and further discussion of plan of care.  Patient Active Problem List   Diagnosis Date Noted  . Poor dentition   . Severe aortic insufficiency   . Knee pain, acute   . Aortic regurgitation   . Acute and subacute infective endocarditis in diseases classified elsewhere   . Protein-calorie malnutrition, severe 07/08/2014  . Viridans streptococci infection 07/08/2014  . Unintentional weight loss 07/08/2014  . Acute pain of right knee 07/08/2014  . Normocytic anemia 07/08/2014  . Hypertension 07/08/2014  . Coronary artery disease 07/08/2014  . Dyslipidemia 07/08/2014  . NSTEMI (non-ST elevated myocardial infarction) 07/06/2014  . Bacteremia 07/06/2014  . Atrial fibrillation 07/06/2014  . Chronic systolic CHF (congestive heart failure) 07/06/2014  . Right lower lobe pneumonia 07/06/2014   Past Medical History  Diagnosis Date  . Hypertension   . Myocardial infarction   . CHF (congestive heart failure)   . Shortness of breath dyspnea   . Arthritis     KNEES   Allergies  Allergen Reactions  . Lipitor [Atorvastatin] Other (See Comments)    Abdominal pain, nausea and chest pain  . Penicillins     Difficulty breathing    Current Outpatient Prescriptions  Medication Sig Dispense Refill  . amiodarone (PACERONE) 200 MG tablet Take 1 tablet (200 mg total) by mouth daily. 30 tablet 0  . digoxin (LANOXIN) 0.125 MG tablet Take 125 mcg by mouth daily.  6  . ELIQUIS 5 MG TABS tablet Take 1 tablet (5 mg total) by mouth 2 (two) times daily. Hold 5 days prior  to dental extraction and heart surgery. 60 tablet 0  . feeding supplement, ENSURE ENLIVE, (ENSURE ENLIVE) LIQD Take 237 mLs by mouth 2 (two) times daily between meals. 237 mL 12  . furosemide (LASIX) 40 MG tablet Take 1 tablet (40 mg total) by mouth daily. 30 tablet 0  . metoprolol tartrate (LOPRESSOR) 50 MG tablet Take 1 tablet (50 mg total) by mouth 2 (two) times daily. 62 tablet 0  . NITROSTAT 0.4 MG SL tablet Take 0.4 mg by mouth. Dissolve 1 tablet under the tongue every 5 minutes as needed for chest pain up to 3 times.  0  . pantoprazole (PROTONIX) 40 MG tablet Take 1 tablet (40 mg total) by mouth daily. 30 tablet 0  . potassium chloride SA (K-DUR,KLOR-CON) 20 MEQ tablet Take 2 tablets (40 mEq total) by mouth daily. 30 tablet 0  . traMADol (ULTRAM) 50 MG tablet Take 50 mg by mouth 3 (three) times daily as needed for moderate pain.    . Vancomycin (VANCOCIN) 750 MG/150ML SOLN Inject 150 mLs (750 mg total) into the vein every 12 (twelve) hours. 6300 mL 0   No current facility-administered medications for this visit.   Facility-Administered Medications Ordered in Other Visits  Medication Dose Route Frequency Provider Last Rate Last Dose  . [START ON 07/20/2014] vancomycin (VANCOCIN) IVPB 750 mg/150 ml premix  750 mg Intravenous  Once Lenn Cal, DDS        PRE-OPERATIVE NOTE:  07/19/2014 [name] [id number]  VITALS: BP 139/30 mmHg  Pulse 57  Temp(Src) 97.7 F (36.5 C) (Oral)  Lab Results  Component Value Date   WBC 7.9 07/15/2014   HGB 10.5* 07/15/2014   HCT 33.4* 07/15/2014   MCV 81.5 07/15/2014   PLT 231 07/15/2014   BMET    Component Value Date/Time   NA 139 07/15/2014 0346   K 3.2* 07/15/2014 0346   CL 107 07/15/2014 0346   CO2 29 07/15/2014 0346   GLUCOSE 94 07/15/2014 0346   BUN 15 07/15/2014 0346   CREATININE 1.33 07/15/2014 0346   CALCIUM 7.9* 07/15/2014 0346   GFRNONAA 51* 07/15/2014 0346   GFRAA 59* 07/15/2014 0346    Lab Results  Component  Value Date   INR 1.21 07/07/2014   INR 1.22 07/06/2014   No results found for: PTT   SUBJECTIVE: The patient denies any acute medical or dental changes.  The patient to PST ar 3 PM.  EXAM: Patient is a well-developed, well-nourished male in no acute distress. Patient walks with the aid of walker. There is no palpable some mandibular lymphadenopathy. The patient denies acute TMJ symptoms. Patient has moderate to severe xerostomia. There is no evidence of oral abscess formation. Patient has multiple missing teeth, multiple retained root segments. Patient has a full bony impacted #32. Multiple dental caries are noted. The patient has chronic periodontitis with plaque calculus accumulations, generalized gingival recession, and tooth mobility. Patient has periapical pathology associated with multiple retained root segments. Patient has a maxillary partial denture that he is now wearing today. Patient has a poor occlusal scheme.   ASSESSMENT: Patient is affected by chronic apical periodontitis, dental caries, multiple retained root segments, chronic periodontitis, and tooth mobility.  PLAN: Patient agrees to proceed with treatment as planned in the operating room as previously discussed and accepts the risks, benefits, and complications of the proposed treatment. Patient is aware of the risk for bleeding, bruising, swelling, infection, pain, nerve damage, soft tissue damage, sinus involvement, root tip fracture, mandible fracture, and the risks of complications associated with the anesthesia. Patient is aware of the potential risk for competitions up to and including death due to his overall cardiovascular compromise. Patient also is aware of the potential for other complications not mentioned above.   Lenn Cal, DDS

## 2014-07-19 NOTE — Patient Instructions (Signed)
Patient to be seen for operating procedure tomorrow morning at 7:30 AM. Patient is reminded to be nothing by mouth after midnight. Patient follow-up with Presurgical testing appointment 3 PM today. Dr. Enrique Sack

## 2014-07-20 ENCOUNTER — Ambulatory Visit (HOSPITAL_COMMUNITY): Payer: Commercial Managed Care - HMO | Admitting: Certified Registered"

## 2014-07-20 ENCOUNTER — Ambulatory Visit (HOSPITAL_COMMUNITY)
Admission: RE | Admit: 2014-07-20 | Discharge: 2014-07-20 | Disposition: A | Discharge status: Home / Self Care | Payer: Commercial Managed Care - HMO | Source: Ambulatory Visit | Attending: Dentistry | Admitting: Dentistry

## 2014-07-20 ENCOUNTER — Ambulatory Visit (HOSPITAL_COMMUNITY): Payer: Commercial Managed Care - HMO | Admitting: Vascular Surgery

## 2014-07-20 ENCOUNTER — Encounter (HOSPITAL_COMMUNITY)
Admission: RE | Disposition: A | Discharge status: Home / Self Care | Payer: Self-pay | Source: Ambulatory Visit | Attending: Dentistry

## 2014-07-20 ENCOUNTER — Encounter (HOSPITAL_COMMUNITY): Payer: Self-pay | Admitting: *Deleted

## 2014-07-20 DIAGNOSIS — Z880 Allergy status to penicillin: Secondary | ICD-10-CM | POA: Insufficient documentation

## 2014-07-20 DIAGNOSIS — K029 Dental caries, unspecified: Secondary | ICD-10-CM | POA: Diagnosis not present

## 2014-07-20 DIAGNOSIS — I5022 Chronic systolic (congestive) heart failure: Secondary | ICD-10-CM | POA: Insufficient documentation

## 2014-07-20 DIAGNOSIS — I252 Old myocardial infarction: Secondary | ICD-10-CM | POA: Insufficient documentation

## 2014-07-20 DIAGNOSIS — K045 Chronic apical periodontitis: Secondary | ICD-10-CM

## 2014-07-20 DIAGNOSIS — I4891 Unspecified atrial fibrillation: Secondary | ICD-10-CM | POA: Insufficient documentation

## 2014-07-20 DIAGNOSIS — E785 Hyperlipidemia, unspecified: Secondary | ICD-10-CM | POA: Insufficient documentation

## 2014-07-20 DIAGNOSIS — K047 Periapical abscess without sinus: Secondary | ICD-10-CM | POA: Diagnosis not present

## 2014-07-20 DIAGNOSIS — K083 Retained dental root: Secondary | ICD-10-CM

## 2014-07-20 DIAGNOSIS — D649 Anemia, unspecified: Secondary | ICD-10-CM | POA: Insufficient documentation

## 2014-07-20 DIAGNOSIS — I2510 Atherosclerotic heart disease of native coronary artery without angina pectoris: Secondary | ICD-10-CM | POA: Insufficient documentation

## 2014-07-20 DIAGNOSIS — Z888 Allergy status to other drugs, medicaments and biological substances status: Secondary | ICD-10-CM | POA: Insufficient documentation

## 2014-07-20 DIAGNOSIS — I351 Nonrheumatic aortic (valve) insufficiency: Secondary | ICD-10-CM

## 2014-07-20 DIAGNOSIS — K0530 Chronic periodontitis, unspecified: Secondary | ICD-10-CM

## 2014-07-20 DIAGNOSIS — J189 Pneumonia, unspecified organism: Secondary | ICD-10-CM | POA: Insufficient documentation

## 2014-07-20 DIAGNOSIS — I10 Essential (primary) hypertension: Secondary | ICD-10-CM | POA: Insufficient documentation

## 2014-07-20 DIAGNOSIS — I38 Endocarditis, valve unspecified: Secondary | ICD-10-CM | POA: Insufficient documentation

## 2014-07-20 DIAGNOSIS — I350 Nonrheumatic aortic (valve) stenosis: Secondary | ICD-10-CM | POA: Insufficient documentation

## 2014-07-20 HISTORY — PX: MULTIPLE EXTRACTIONS WITH ALVEOLOPLASTY: SHX5342

## 2014-07-20 SURGERY — MULTIPLE EXTRACTION WITH ALVEOLOPLASTY
Anesthesia: General | Site: Mouth

## 2014-07-20 MED ORDER — OXYCODONE HCL 5 MG/5ML PO SOLN: 5.0000 mg | Freq: Once | ORAL | Status: DC | PRN

## 2014-07-20 MED ORDER — LIDOCAINE-EPINEPHRINE 2 %-1:100000 IJ SOLN
INTRAMUSCULAR | Status: DC | PRN
  Administered 2014-07-20 (×3): 1.7 mL via INTRADERMAL

## 2014-07-20 MED ORDER — OXYCODONE HCL 5 MG PO TABS: 5.0000 mg | ORAL_TABLET | Freq: Once | ORAL | Status: DC | PRN

## 2014-07-20 MED ORDER — ARTIFICIAL TEARS OP OINT
TOPICAL_OINTMENT | OPHTHALMIC | Status: AC
  Filled 2014-07-20: qty 3.5

## 2014-07-20 MED ORDER — LIDOCAINE-EPINEPHRINE 2 %-1:100000 IJ SOLN
INTRAMUSCULAR | Status: AC
  Filled 2014-07-20: qty 10.2

## 2014-07-20 MED ORDER — GELATIN ABSORBABLE 12-7 MM EX MISC
CUTANEOUS | Status: DC | PRN
  Administered 2014-07-20: 1

## 2014-07-20 MED ORDER — FENTANYL CITRATE 0.05 MG/ML IJ SOLN
INTRAMUSCULAR | Status: AC
  Filled 2014-07-20: qty 5

## 2014-07-20 MED ORDER — LIDOCAINE HCL (CARDIAC) 20 MG/ML IV SOLN
INTRAVENOUS | Status: AC
  Filled 2014-07-20: qty 5

## 2014-07-20 MED ORDER — ACETAMINOPHEN 325 MG PO TABS: 325.0000 mg | ORAL_TABLET | ORAL | Status: DC | PRN

## 2014-07-20 MED ORDER — ACETAMINOPHEN 160 MG/5ML PO SOLN
325.0000 mg | ORAL | Status: DC | PRN
  Filled 2014-07-20: qty 20.3

## 2014-07-20 MED ORDER — PROPOFOL 10 MG/ML IV BOLUS
INTRAVENOUS | Status: AC
  Filled 2014-07-20: qty 20

## 2014-07-20 MED ORDER — PROPOFOL 10 MG/ML IV BOLUS
INTRAVENOUS | Status: DC | PRN
  Administered 2014-07-20 (×2): 20 mg via INTRAVENOUS
  Administered 2014-07-20: 60 mg via INTRAVENOUS

## 2014-07-20 MED ORDER — ONDANSETRON HCL 4 MG/2ML IJ SOLN
INTRAMUSCULAR | Status: AC
  Filled 2014-07-20: qty 2

## 2014-07-20 MED ORDER — HEPARIN SOD (PORK) LOCK FLUSH 100 UNIT/ML IV SOLN
250.0000 [IU] | Freq: Every day | INTRAVENOUS | Status: DC
  Administered 2014-07-20: 250 [IU]

## 2014-07-20 MED ORDER — MIDAZOLAM HCL 5 MG/5ML IJ SOLN
INTRAMUSCULAR | Status: DC | PRN
  Administered 2014-07-20: 1 mg via INTRAVENOUS

## 2014-07-20 MED ORDER — 0.9 % SODIUM CHLORIDE (POUR BTL) OPTIME
TOPICAL | Status: DC | PRN
  Administered 2014-07-20: 1000 mL

## 2014-07-20 MED ORDER — ONDANSETRON HCL 4 MG/2ML IJ SOLN
INTRAMUSCULAR | Status: DC | PRN
  Administered 2014-07-20: 4 mg via INTRAVENOUS

## 2014-07-20 MED ORDER — BUPIVACAINE-EPINEPHRINE 0.5% -1:200000 IJ SOLN
INTRAMUSCULAR | Status: DC | PRN
  Administered 2014-07-20: 3.6 mL

## 2014-07-20 MED ORDER — BUPIVACAINE-EPINEPHRINE (PF) 0.5% -1:200000 IJ SOLN
INTRAMUSCULAR | Status: AC
  Filled 2014-07-20: qty 3.6

## 2014-07-20 MED ORDER — HEPARIN SOD (PORK) LOCK FLUSH 100 UNIT/ML IV SOLN: 250.0000 [IU] | INTRAVENOUS | Status: DC | PRN

## 2014-07-20 MED ORDER — OXYCODONE-ACETAMINOPHEN 5-325 MG PO TABS: ORAL_TABLET | ORAL | Status: DC

## 2014-07-20 MED ORDER — ROCURONIUM BROMIDE 50 MG/5ML IV SOLN
INTRAVENOUS | Status: AC
  Filled 2014-07-20: qty 1

## 2014-07-20 MED ORDER — HYDROCODONE-ACETAMINOPHEN 5-325 MG PO TABS: 1.0000 | ORAL_TABLET | Freq: Four times a day (QID) | ORAL | Status: DC | PRN

## 2014-07-20 MED ORDER — EPHEDRINE SULFATE 50 MG/ML IJ SOLN
INTRAMUSCULAR | Status: DC | PRN
  Administered 2014-07-20 (×11): 5 mg via INTRAVENOUS

## 2014-07-20 MED ORDER — GLYCOPYRROLATE 0.2 MG/ML IJ SOLN
INTRAMUSCULAR | Status: AC
  Filled 2014-07-20: qty 1

## 2014-07-20 MED ORDER — OXYMETAZOLINE HCL 0.05 % NA SOLN
NASAL | Status: AC
  Filled 2014-07-20: qty 15

## 2014-07-20 MED ORDER — MIDAZOLAM HCL 2 MG/2ML IJ SOLN
INTRAMUSCULAR | Status: AC
  Filled 2014-07-20: qty 2

## 2014-07-20 MED ORDER — HEPARIN SOD (PORK) LOCK FLUSH 100 UNIT/ML IV SOLN
250.0000 [IU] | INTRAVENOUS | Status: DC | PRN
  Filled 2014-07-20: qty 3

## 2014-07-20 MED ORDER — FENTANYL CITRATE 0.05 MG/ML IJ SOLN: 25.0000 ug | INTRAMUSCULAR | Status: DC | PRN

## 2014-07-20 MED ORDER — SUCCINYLCHOLINE CHLORIDE 20 MG/ML IJ SOLN
INTRAMUSCULAR | Status: DC | PRN
  Administered 2014-07-20: 40 mg via INTRAVENOUS

## 2014-07-20 MED ORDER — LACTATED RINGERS IV SOLN
INTRAVENOUS | Status: DC | PRN
  Administered 2014-07-20: 08:00:00 via INTRAVENOUS

## 2014-07-20 MED ORDER — LIDOCAINE HCL (CARDIAC) 20 MG/ML IV SOLN
INTRAVENOUS | Status: DC | PRN
  Administered 2014-07-20: 60 mg via INTRAVENOUS

## 2014-07-20 MED ORDER — FENTANYL CITRATE 0.05 MG/ML IJ SOLN
INTRAMUSCULAR | Status: DC | PRN
Start: 2014-07-20 — End: 2014-07-20
  Administered 2014-07-20: 50 ug via INTRAVENOUS

## 2014-07-20 SURGICAL SUPPLY — 34 items
ALCOHOL 70% 16 OZ (MISCELLANEOUS) ×3 IMPLANT
ATTRACTOMAT 16X20 MAGNETIC DRP (DRAPES) ×3 IMPLANT
BLADE SURG 15 STRL LF DISP TIS (BLADE) ×2 IMPLANT
BLADE SURG 15 STRL SS (BLADE) ×4
COVER SURGICAL LIGHT HANDLE (MISCELLANEOUS) ×3 IMPLANT
GAUZE PACKING FOLDED 2  STR (GAUZE/BANDAGES/DRESSINGS) ×2
GAUZE PACKING FOLDED 2 STR (GAUZE/BANDAGES/DRESSINGS) ×1 IMPLANT
GAUZE SPONGE 4X4 16PLY XRAY LF (GAUZE/BANDAGES/DRESSINGS) ×3 IMPLANT
GLOVE BIOGEL PI IND STRL 6 (GLOVE) ×1 IMPLANT
GLOVE BIOGEL PI INDICATOR 6 (GLOVE) ×2
GLOVE SURG ORTHO 8.0 STRL STRW (GLOVE) ×3 IMPLANT
GLOVE SURG SS PI 6.0 STRL IVOR (GLOVE) ×3 IMPLANT
GOWN STRL REUS W/ TWL LRG LVL3 (GOWN DISPOSABLE) ×1 IMPLANT
GOWN STRL REUS W/TWL 2XL LVL3 (GOWN DISPOSABLE) ×3 IMPLANT
GOWN STRL REUS W/TWL LRG LVL3 (GOWN DISPOSABLE) ×2
HEMOSTAT SURGICEL 2X14 (HEMOSTASIS) ×3 IMPLANT
KIT BASIN OR (CUSTOM PROCEDURE TRAY) ×3 IMPLANT
KIT ROOM TURNOVER OR (KITS) ×3 IMPLANT
MANIFOLD NEPTUNE WASTE (CANNULA) ×3 IMPLANT
NEEDLE BLUNT 16X1.5 OR ONLY (NEEDLE) ×3 IMPLANT
NS IRRIG 1000ML POUR BTL (IV SOLUTION) ×3 IMPLANT
PACK EENT II TURBAN DRAPE (CUSTOM PROCEDURE TRAY) ×3 IMPLANT
PAD ARMBOARD 7.5X6 YLW CONV (MISCELLANEOUS) ×3 IMPLANT
SPONGE GAUZE 4X4 12PLY STER LF (GAUZE/BANDAGES/DRESSINGS) ×3 IMPLANT
SPONGE SURGIFOAM ABS GEL 100 (HEMOSTASIS) ×3 IMPLANT
SPONGE SURGIFOAM ABS GEL 12-7 (HEMOSTASIS) IMPLANT
SPONGE SURGIFOAM ABS GEL SZ50 (HEMOSTASIS) IMPLANT
SUCTION FRAZIER TIP 10 FR DISP (SUCTIONS) ×3 IMPLANT
SUT CHROMIC 3 0 PS 2 (SUTURE) ×6 IMPLANT
SYR 50ML SLIP (SYRINGE) ×3 IMPLANT
TOWEL OR 17X26 10 PK STRL BLUE (TOWEL DISPOSABLE) ×3 IMPLANT
TUBE CONNECTING 12'X1/4 (SUCTIONS) ×1
TUBE CONNECTING 12X1/4 (SUCTIONS) ×2 IMPLANT
YANKAUER SUCT BULB TIP NO VENT (SUCTIONS) ×3 IMPLANT

## 2014-07-20 NOTE — Transfer of Care (Signed)
Immediate Anesthesia Transfer of Care Note  Patient: Joe Kennedy  Procedure(s) Performed: Procedure(s): Extraction of tooth #'s 2,6,8,14,21,22,23,27,28,30 with alveoloplasty  (N/A)  Patient Location: PACU  Anesthesia Type:General  Level of Consciousness: awake and alert   Airway & Oxygen Therapy: Patient Spontanous Breathing and Patient connected to face mask oxygen  Post-op Assessment: Report given to RN and Post -op Vital signs reviewed and stable  Post vital signs: Reviewed and stable  Last Vitals:  Filed Vitals:   07/20/14 0945  BP: 120/28  Pulse: 76  Temp: 36.5 C  Resp: 17    Complications: No apparent anesthesia complications

## 2014-07-20 NOTE — Anesthesia Preprocedure Evaluation (Signed)
Anesthesia Evaluation  Patient identified by MRN, date of birth, ID band Patient awake    Reviewed: Allergy & Precautions, NPO status , Patient's Chart, lab work & pertinent test results, reviewed documented beta blocker date and time   History of Anesthesia Complications Negative for: history of anesthetic complications  Airway Mallampati: II  TM Distance: >3 FB Neck ROM: Full    Dental  (+) Poor Dentition, Chipped   Pulmonary shortness of breath, neg sleep apnea, pneumonia -, resolved,  breath sounds clear to auscultation        Cardiovascular hypertension, Pt. on medications and Pt. on home beta blockers + CAD, + Past MI and +CHF + Valvular Problems/Murmurs AI Rhythm:Regular + Diastolic murmurs CHF EF 08%, mod to severe AR and TR   Neuro/Psych negative neurological ROS  negative psych ROS   GI/Hepatic Neg liver ROS, GERD-  Medicated and Controlled,  Endo/Other  negative endocrine ROS  Renal/GU Renal InsufficiencyRenal disease     Musculoskeletal  (+) Arthritis -,   Abdominal   Peds  Hematology  (+) anemia ,   Anesthesia Other Findings   Reproductive/Obstetrics                             Anesthesia Physical Anesthesia Plan  ASA: IV  Anesthesia Plan: General   Post-op Pain Management:    Induction: Intravenous  Airway Management Planned: Oral ETT  Additional Equipment: Arterial line  Intra-op Plan:   Post-operative Plan: Extubation in OR  Informed Consent: I have reviewed the patients History and Physical, chart, labs and discussed the procedure including the risks, benefits and alternatives for the proposed anesthesia with the patient or authorized representative who has indicated his/her understanding and acceptance.     Plan Discussed with: CRNA and Surgeon  Anesthesia Plan Comments:         Anesthesia Quick Evaluation

## 2014-07-20 NOTE — H&P (Signed)
07/20/2014  Patient:            ASAAD GULLEY Date of Birth:  1940/08/14 MRN:                [id number]   BP 155/27 mmHg  Pulse 59  Temp(Src) 97 F (36.1 C)  Resp 20  Ht 5\' 9"  (1.753 m)  Wt 181 lb (82.101 kg)  BMI 26.72 kg/m2  SpO2 98%   RANDLE SHATZER 74 year old male that presents for multiple dental extractions with plasty in the operating room with general anesthesia. Patient denies any acute medical or dental changes. Please use of Dr. Johnsie Cancel as the H&P for the dental operating room procedure. Lenn Cal, DDS  Author: Thayer Headings, MD Service: Cardiology Author Type: Physician    Filed: 07/06/2014 5:43 PM Note Time: 07/06/2014 3:41 PM Status: Signed   Editor: Thayer Headings, MD (Physician)     Related Notes: Original Note by Eileen Stanford, PA-C (Physician Assistant Certified) filed at 0/17/7939 5:02 PM   Expand All Collapse All       Patient ID: MARCY BOGOSIAN MRN: [id number], DOB/AGE: April 27, 1940   Admit date: 07/06/2014   Primary Physician: No primary care provider on file. Primary Cardiologist: Dr. Clayborn Bigness  Pt. Profile:  SACHIN FERENCZ is a 74 y.o. male with a history of atrial fibrillation on Eliquis, CAD, HLD, chronic systolic heart failure (EF 35%), chronic anemia, and HTN who presented to [facility] with a syncopal episode on 07/04/2014. He eventually ruled in for NSTEMI and was transferred to [facility] today for evaluation by [facility].  He is followed by Dr. Clayborn Bigness for cardiology. He was hospitalized earlier in March with angina, NSTEMI and had a cardiac catheterization, which showed two-vessel disease not amenable to intervention. Medical management was recommended. He was sent home on medical therapy.   He re-presented to [facility] on 07/04/14 with syncope while at his PCP's office. This hospitalization was further complicated by hypotension, right lower lobe pneumonia, acute hypoxic respiratory failure requiring BiPAP, acute renal failure,  bacteremia ( gram + cocci), afib with RVR and NSTEMI. During his admission he developed further chest pain and his troponin was noted to be elevated and peaked around 11. His cardiologist, Dr. Clayborn Bigness, was notified and he recommended CABG. He was transferred to [facility] for further evaluation. He was transferred on IV heparin and IV Vanc.   He is not currently having any chest pain and if feeling better. History is difficult to obtain as he was given a xanax before transfer which made him quite sedated. No SOB, orthopnea, PND or LE edema.    Problem List  Past Medical History  Diagnosis Date  . Hypertension   . Myocardial infarction     No past surgical history on file.   Allergies  Allergies  Allergen Reactions  . Penicillins     Unable to say what kind of reaction     Home Medications  Prior to Admission medications   Medication Sig Start Date End Date Taking? Authorizing Provider  amiodarone (PACERONE) 200 MG tablet Take 200 mg by mouth daily. 06/23/14   Historical Provider, MD  atorvastatin (LIPITOR) 40 MG tablet Take 40 mg by mouth daily. 06/23/14   Historical Provider, MD  digoxin (LANOXIN) 0.125 MG tablet Take 125 mcg by mouth daily. 06/17/14   Historical Provider, MD  ELIQUIS 5 MG TABS tablet Take 5 mg by mouth 2 (two) times daily. 06/23/14   Historical  Provider, MD  etodolac (LODINE) 500 MG tablet Take 500 mg by mouth 2 (two) times daily. 06/01/14   Historical Provider, MD  isosorbide mononitrate (IMDUR) 30 MG 24 hr tablet Take 30 mg by mouth every morning. 06/23/14   Historical Provider, MD  lisinopril (PRINIVIL,ZESTRIL) 10 MG tablet Take 10 mg by mouth daily. 06/23/14   Historical Provider, MD  metoprolol tartrate (LOPRESSOR) 25 MG tablet Take 25 mg by mouth 2 (two) times daily. 03/30/14   Historical Provider, MD  NITROSTAT 0.4 MG SL tablet Take 0.4 mg by mouth. Dissolve 1 tablet  under the tongue every 5 minutes as needed for chest pain up to 3 times. 06/23/14   Historical Provider, MD    Family History  Family History  Problem Relation Age of Onset  . Coronary artery disease Brother    Family Status  Relation Status Death Age  . Mother Deceased   . Father Deceased   . Sister Alive   . Brother Alive      Social History  History   Social History  . Marital Status: Married    Spouse Name: N/A  . Number of Children: N/A  . Years of Education: N/A   Occupational History  . Not on file.   Social History Main Topics  . Smoking status: Never Smoker   . Smokeless tobacco: Not on file  . Alcohol Use: 0.6 oz/week    1 Cans of beer per week  . Drug Use: Not on file  . Sexual Activity: Not on file   Other Topics Concern  . Not on file   Social History Narrative  . No narrative on file      All other systems reviewed and are otherwise negative except as noted above.  Physical Exam  Blood pressure 108/38, temperature 98.1 F (36.7 C), height 6' (1.829 m), SpO2 100 %.  General: Pleasant, NAD Psych: Normal affect. Neuro: Alert and oriented X 3. Moves all extremities spontaneously. HEENT: Normal Neck: Supple without bruits or JVD. Lungs: Resp regular and unlabored, CTA. Mild rales at bases Heart: RRR no s3, s4, or 1-4/4 diastolic murmur. Abdomen: Soft, non-tender, non-distended, BS + x 4.  Extremities: No clubbing, cyanosis or edema. DP/PT/Radials 2+ and equal bilaterally. Right knee swollen   Labs   Recent Labs (last 2 labs)     No results for input(s): CKTOTAL, CKMB, TROPONINI in the last 72 hours.    Recent Labs    No results found for: WBC, HGB, HCT, MCV, PLT   No results for input(s): NA, K, CL, CO2, BUN, CREATININE, CALCIUM, PROT, BILITOT, ALKPHOS, ALT, AST, GLUCOSE in the last 168 hours.  Invalid input(s): LABALBU  Recent Labs     No results found for: CHOL, HDL, LDLCALC, TRIG    Recent Labs    No results found for: DDIMER    Radiology/Studies   Imaging Results    No results found.    ECG  None   ASSESSMENT AND PLAN SELBY FOISY is a 74 y.o. male with a history of atrial fibrillation on Eliquis, CAD, HLD, chronic systolic heart failure (EF 35%), chronic anemia, and HTN who presented to [facility] with a syncopal episode on 07/04/2014. This hospitalization was further complicated by hypotension, right lower lobe pneumonia, acute hypoxic respiratory failure requiring BiPAP, acute renal failure, bacteremia ( gram + cocci), afib with RVR and NSTEMI. He was seen by his cardiologist, Dr Clayborn Bigness, who recommended transfer to [facility] today for evaluation by [facility]  for possible CABG.  NSTEMI- pk troponin ~ 11. Recent NSTEMI with [facility] with two-vessel disease not amenable to intervention (early march). Medical management was recommended. He then was found to have recurrent chest pain and NSTEMI during a subsequent hospitalization for syncope. Transferred from [facility] to [facility] for evaluation for CABG.  -- Continue heparin gtt.  -- Will continue to cycle enzymes and serial ECGs -- Will consult CTCS; however, case further complicated by positive blood cultures. Cath may need to be repeated. Dr. Acie Fredrickson to review films with interventional cardiologist.   Chronic systolic CHF- EF 45% by LV gram on last heart cath.  -- Will obtain a 2D ECHO -- He appears euvolemic currently  -- Continue BB. ACE currently held with recent AKI and hypotension.   Syncopal episode- felt to be due to hypotension. He improved with holding antihypertensives and IVFs. -- Monitor on tele  RLL PNA- continue abx he has 5 more days of levofloxacin 750mg  qd -- Acute respiratory failure now resolved.  -- Will repeat CXR  G+ cocci bacteremia- started on 1000mg  IV vanc today. Will ask pharmacy to help dose -- Will obtain an IM consult. I have  placed an order for repeat blood cultures x2  Atrial fibrillation- currently in NSR. Continue amiodarone 200 mg and digoxin 0.125 mg. Eliquis stopped and placed on heparin gtt in the setting of NSTEMI.   HTN- blood pressure stable but soft. Holding lisinopril and imdur.   Acute renal failure- felt to be pre-renal. Given IVFs. Will repeat a BMET today.    Dispo- this patient is very complicated and I spoke with Dr. Hartford Poli who recommended that IM take over care of patient as primary service. We appreciate their help.  Judy Pimple, PA-C 07/06/2014, 4:46 PM  Pager [phone]   Attending Note:   The patient was seen and examined. Agree with assessment and plan as noted above. Changes made to the above note as needed.  1. CAD : i have reviewed the angiograms with Dr. Angelena Form. The LAD has moderate disease but is not seen very well ijn all views. The LCx has a tight stenosis but is not a good target for CABG - it primarily fills a small , subtotalled OM ( that already has collateral flow). The RCA has distal disease and could potentially be stented .   The LV has anterior apical akinesis - which is not explained by moderate LAD disease.  He will need a repeat cardiac cath for further evaluatioin.  At present, he was just found to have + blood cultures and will need IV antibiotics prior to doing anything with the heart.  He is pain free at present.   2. Bacteremia: The initial reports from [facility] show [place] in the aerobic and anaerobic bottles. He was started on Vanc today. We have placed a pharmacy consult. Will need to get the final results from [facility]. Wiill get a Int. Med consult to help with this   3. ? Pneumonia: Will ask [facility] to help [place] with management of this .  4. Aortic insufficiency: By exam. Echo to be done  5. Chronic systolic chf: EF ~14-78 %. Continue CHF meds as tolerated .Marland Kitchen Restart ARB when renal function improves.   6.   Thayer Headings, Brooke Bonito., MD, [facility] 07/06/2014, 5:36 PM [address] [place] Pager [phone]

## 2014-07-20 NOTE — Op Note (Signed)
OPERATIVE REPORT  Patient:            Joe Kennedy Date of Birth:  04-Feb-1941 MRN:                [id number]   DATE OF PROCEDURE:  07/20/2014  PREOPERATIVE DIAGNOSES: 1.   Endocarditis  2.   Aortic stenosis 3.  Pre- heart valve surgery dental protocol  4. Chronic apical periodontitis 5. Retained root segments 6. Dental caries 7. Chronic periodontitis  POSTOPERATIVE DIAGNOSES: 1.   Endocarditis  2.   Aortic stenosis 3.  Pre- heart valve surgery dental protocol  4. Chronic apical periodontitis 5. Retained root segments 6. Dental caries 7. Chronic periodontitis  OPERATIONS: 1. Multiple extraction of tooth numbers 2,6,8,14, 21,22,23,27,28,and 30. 2. Four Quadrants of alveoloplasty  SURGEON: Lenn Cal, DDS  ASSISTANT: Camie Patience, (dental assistant)  ANESTHESIA: General anesthesia via oral endotracheal tube.  MEDICATIONS: 1. Vancomycin 750 mg IV prior to invasive dental procedures  2. Local anesthesia with a total utilization of 3 carpules each containing 34 mg of lidocaine with 0.017 mg of epinephrine as well as 2 carpules each containing 9 mg of bupivacaine with 0.009 mg of epinephrine.  SPECIMENS: There are 10 teeth that were discarded.  DRAINS: None  CULTURES: None  COMPLICATIONS: None   ESTIMATED BLOOD LOSS: 50 mLs.  INTRAVENOUS FLUIDS: 800 mLs of Lactated ringers solution.  INDICATIONS: The patient was recently diagnosed with endocarditis,aortic stenosis, and coronary artery disease.  A dental consultation was then requested to evaluate poor dentition.  The patient was examined and treatment planned for multiple extractions with alveoloplasty and and alveoloplasty with general anesthesia.  This treatment plan was formulated to decrease the risks and complications associated with dental infection from affecting the patient's systemic health and anticipated heart valve surgery.  OPERATIVE FINDINGS: Patient was examined in operating room number  10.  The teeth were identified for extraction. The patient was noted be affected by chronic periodontitis, chronic apical periodontitis, multiple retained root segments, dental caries. A full bony impacted #32 was also noted but it was determined that this would not be extracted at this time.  The patient is to follow-up with an oral surgeon for extraction of this tooth as indicated.   DESCRIPTION OF PROCEDURE: Patient was brought to the main operating room number 10. Patient was then placed in the supine position on the operating table. General anesthesia was then induced per the anesthesia team. The patient was then prepped and draped in the usual manner for dental medicine procedure. A timeout was performed. The patient was identified and procedures were verified. A throat pack was placed at this time. The oral cavity was then thoroughly examined with the findings noted above. The patient was then ready for dental medicine procedure as follows:  Local anesthesia was then administered sequentially with a total utilization of 3 carpules each containing 34 mg of lidocaine with 0.017 mg of epinephrine as well as 2 carpules  each containing 9 mg bupivacaine with 0.009 mg of epinephrine.  The Maxillary left and right quadrants first approached. Anesthesia was then delivered utilizing infiltration with lidocaine with epinephrine. A #15 blade incision was then made from the maxillary right tuberosity and extended to the mesial of #11.  A  surgical flap was then carefully reflected. Tooth numbers 2, 6, 8, and 14 were then removed with a 150 forceps and rongeur as indicated.  Alveoloplasty was then performed utilizing a ronguers and bone file. Excessive granulation tissue on  the palatal aspect of #8 through 10 area was removed with a rongeur. The surgical site was then irrigated with copious amounts of sterile saline. The tissues were approximated and trimmed appropriately. A piece of Surgifoam was placed in the  extraction sockets appropriately. The maxillary right surgical site was then closed from the maxillary right tuberosity and extended to the mesial of #8 utilizing 3-0 chromic gut suture in a continuous interrupted suture technique 1. The maxillary left surgical site was then closed from the mesial of #12 and extended to the mesial of #9 utilizing 3-0 chromic gut suture in a continuous interrupted suture technique 1. No sutures were placed in the area of #14.  At this point time, the mandibular quadrants were approached. The patient was given bilateral inferior alveolar nerve blocks and long buccal nerve blocks utilizing the bupivacaine with epinephrine. Further infiltration was then achieved utilizing the lidocaine with epinephrine. A 15 blade incision was then made from the distal of number distal of #19 and extended to the distal of #30.  A surgical flap was then carefully reflected. Appropriate amounts of buccal and interseptal bone were then removed utilizing a surgical handpiece and copious amount of sterile water around tooth numbers 21, 22, 27, 28, and 30. The lower teeth were then subluxated with a series straight elevators. The coronal aspect of tooth numbers 21, 22 were then removed with a 151 forceps leaving roots remaining. Further bone was then removed with a surgical handpiece and bur and copious amounts sterile water. The retained roots in the area of tooth numbers 21, 22, 23 were then removed with a rongeur without further complication. Tooth numbers 27 and 28 were then removed with a 151 forceps leaving a retained root in the area of #28. Further bone was then removed around the root and this was further subluxated and removed with a cryers elevators without complication. Tooth #30 was then removed with a 23 forceps without complications. Alveoloplasty was then performed utilizing a rongeur and bone file. The tissues were approximated and trimmed appropriately. The surgical sites were then  irrigated with copious amounts of sterile saline. Surgifoam was placed in the extraction sockets appropriately. The mandibular left surgical site was then closed from the distal of  19 and extended the mesial #24 utilizing 3-0 chromic gut suture in a continuous interrupted suture technique 1. The mandibular right surgical site was then closed from the mesial of #31 and extended to the mesial of #25 utilizing 3-0 chromic gut suture in a continuous interrupted suture technique 1.    At this point time, the entire mouth was irrigated with copious amounts of sterile saline. The patient was examined for complications, seeing none, the dental medicine procedure was deemed to be complete. The throat pack was removed at this time. An oral airway was then placed at the request of the anesthesia team. A series of 4 x 4 gauze were placed in the mouth to aid hemostasis. The patient was then handed over to the anesthesia team for final disposition. After an appropriate amount of time, the patient was extubated and taken to the postanesthsia care unit in good condition.  All counts were correct for the dental medicine procedure. The patient was provided instructions on restarting his anticoagulant therapy. Patient was given a prescription for hydrocodone with acetaminophen for pain control. Patient is to return to clinic in 7-10 days for evaluation of suture removal.   Lenn Cal, DDS.

## 2014-07-20 NOTE — OR Nursing (Signed)
Pt discharged home in the care of his wife.  All discharge instructions provided and questions answered.  PICC flushed and heparin instilled for discharge by IV therapy.  Wife informed of instructions regarding care of PICC for therapy tomorrow.

## 2014-07-20 NOTE — Discharge Instructions (Signed)

## 2014-07-20 NOTE — Anesthesia Postprocedure Evaluation (Signed)
  Anesthesia Post-op Note  Patient: Joe Kennedy  Procedure(s) Performed: Procedure(s): Extraction of tooth #'s 2,6,8,14,21,22,23,27,28,30 with alveoloplasty  (N/A)  Patient Location: PACU  Anesthesia Type:General  Level of Consciousness: awake and alert   Airway and Oxygen Therapy: Patient Spontanous Breathing  Post-op Pain: none  Post-op Assessment: Post-op Vital signs reviewed, Patient's Cardiovascular Status Stable, Respiratory Function Stable, Patent Airway, No signs of Nausea or vomiting and Pain level controlled  Post-op Vital Signs: Reviewed and stable  Last Vitals:  Filed Vitals:   07/20/14 1230  BP: 134/37  Pulse: 74  Temp:   Resp: 21    Complications: No apparent anesthesia complications

## 2014-07-20 NOTE — Progress Notes (Signed)
PRE-OPERATIVE NOTE:  07/20/2014 Joe Kennedy [id number]  VITALS: BP 155/27 mmHg  Pulse 59  Temp(Src) 97 F (36.1 C)  Resp 20  Ht 5\' 9"  (1.753 m)  Wt 181 lb (82.101 kg)  BMI 26.72 kg/m2  SpO2 98%  Lab Results  Component Value Date   WBC 11.3* 07/19/2014   HGB 12.4* 07/19/2014   HCT 38.8* 07/19/2014   MCV 82.4 07/19/2014   PLT 228 07/19/2014   BMET    Component Value Date/Time   NA 136 07/19/2014 1554   K 4.5 07/19/2014 1554   CL 104 07/19/2014 1554   CO2 23 07/19/2014 1554   GLUCOSE 114* 07/19/2014 1554   BUN 18 07/19/2014 1554   CREATININE 1.58* 07/19/2014 1554   CALCIUM 9.0 07/19/2014 1554   GFRNONAA 41* 07/19/2014 1554   GFRAA 48* 07/19/2014 1554    Lab Results  Component Value Date   INR 1.21 07/07/2014   INR 1.22 07/06/2014   No results found for: PTT   Joe Kennedy presents for multiple dental extractions with alveoloplasty in the operating room with general anesthesia.    SUBJECTIVE: The patient denies any acute medical or dental changes and agrees to proceed with treatment as planned.  EXAM: No sign of acute dental changes.  ASSESSMENT: Patient is affected by chronic apical periodontitis, multiple retained root segments, dental caries, chronic periodontitis, and tooth mobility. The patient also has a full bony impacted #32 that will not be extracted today.  PLAN: Patient agrees to proceed with treatment as planned in the operating room as previously discussed and accepts the risks, benefits, and complications of the proposed treatment. Patient is aware of the risk for bleeding, bruising, swelling, infection, pain, nerve damage, soft tissue damage, sinus involvement, root tip fracture, mandible fracture, and the risks of complications associated with the anesthesia. Patient is aware of the potential risk for complications up to and including death due to his overall cardiovascular compromise. Patient also is aware of the potential for other  complications not mentioned above.   Lenn Cal, DDS

## 2014-07-20 NOTE — Anesthesia Procedure Notes (Signed)
Procedure Name: Intubation Date/Time: 07/20/2014 8:01 AM Performed by: Maryland Pink Pre-anesthesia Checklist: Patient identified, Emergency Drugs available, Suction available, Patient being monitored and Timeout performed Patient Re-evaluated:Patient Re-evaluated prior to inductionOxygen Delivery Method: Circle system utilized Preoxygenation: Pre-oxygenation with 100% oxygen Intubation Type: IV induction Ventilation: Mask ventilation without difficulty and Oral airway inserted - appropriate to patient size Laryngoscope Size: Mac and 4 Grade View: Grade I Tube type: Oral Tube size: 7.0 mm Number of attempts: 1 Airway Equipment and Method: Stylet Placement Confirmation: ETT inserted through vocal cords under direct vision,  positive ETCO2 and breath sounds checked- equal and bilateral Secured at: 20 cm Tube secured with: Tape Dental Injury: Teeth and Oropharynx as per pre-operative assessment

## 2014-07-24 ENCOUNTER — Encounter: Payer: Self-pay | Admitting: Surgery

## 2014-07-24 ENCOUNTER — Encounter (HOSPITAL_COMMUNITY): Payer: Self-pay | Admitting: Dentistry

## 2014-07-25 ENCOUNTER — Ambulatory Visit (INDEPENDENT_AMBULATORY_CARE_PROVIDER_SITE_OTHER): Payer: Commercial Managed Care - HMO | Admitting: Nurse Practitioner

## 2014-07-25 ENCOUNTER — Encounter: Payer: Self-pay | Admitting: Nurse Practitioner

## 2014-07-25 VITALS — BP 128/40 | HR 60 | Ht 72.0 in | Wt 182.4 lb

## 2014-07-25 DIAGNOSIS — I5022 Chronic systolic (congestive) heart failure: Secondary | ICD-10-CM | POA: Diagnosis not present

## 2014-07-25 DIAGNOSIS — I259 Chronic ischemic heart disease, unspecified: Secondary | ICD-10-CM | POA: Diagnosis not present

## 2014-07-25 DIAGNOSIS — I480 Paroxysmal atrial fibrillation: Secondary | ICD-10-CM

## 2014-07-25 DIAGNOSIS — Z79899 Other long term (current) drug therapy: Secondary | ICD-10-CM

## 2014-07-25 DIAGNOSIS — I330 Acute and subacute infective endocarditis: Secondary | ICD-10-CM

## 2014-07-25 DIAGNOSIS — I288 Other diseases of pulmonary vessels: Secondary | ICD-10-CM | POA: Diagnosis not present

## 2014-07-25 LAB — BASIC METABOLIC PANEL
BUN: 18 mg/dL (ref 6–23)
CO2: 27 mEq/L (ref 19–32)
Calcium: 8.9 mg/dL (ref 8.4–10.5)
Chloride: 104 mEq/L (ref 96–112)
Creatinine, Ser: 1.61 mg/dL — ABNORMAL HIGH (ref 0.40–1.50)
GFR: 44.69 mL/min — ABNORMAL LOW (ref >60.00)
Glucose, Bld: 126 mg/dL — ABNORMAL HIGH (ref 70–99)
Potassium: 4.3 mEq/L (ref 3.5–5.1)
Sodium: 136 mEq/L (ref 135–145)

## 2014-07-25 LAB — BRAIN NATRIURETIC PEPTIDE: Pro B Natriuretic peptide (BNP): 1471 pg/mL — ABNORMAL HIGH (ref 0.0–100.0)

## 2014-07-25 LAB — CBC
HCT: 35.7 % — ABNORMAL LOW (ref 39.0–52.0)
Hemoglobin: 11.7 g/dL — ABNORMAL LOW (ref 13.0–17.0)
MCHC: 32.7 g/dL (ref 30.0–36.0)
MCV: 80.4 fl (ref 78.0–100.0)
Platelets: 276 10*3/uL (ref 150.0–400.0)
RBC: 4.44 Mil/uL (ref 4.22–5.81)
RDW: 21.9 % — ABNORMAL HIGH (ref 11.5–15.5)
WBC: 7.9 10*3/uL (ref 4.0–10.5)

## 2014-07-25 NOTE — Progress Notes (Signed)
CARDIOLOGY OFFICE NOTE  Date:  07/25/2014    Joe Kennedy Date of Birth: 06/01/1940 Medical Record #[id number]  PCP:  Otilio Miu, MD  Cardiologist:  Nahser (formally saw Dr. Clayborn Bigness)    Chief Complaint  Patient presents with  . SBE/CHF/AF    Post hospital visit/TOC - seen for Dr. Acie Fredrickson     History of Present Illness: Joe Kennedy is a 74 y.o. male who presents today for a post hospital visit. Seen for Dr. Acie Fredrickson. Former patient of Dr. Etta Quill from [place]. He has a history of atrial fibrillation on Eliquis, CAD, HLD, chronic systolic heart failure (EF 35%), chronic anemia, and HTN.   He presented to [facility] with a syncopal episode on 07/04/2014. He eventually ruled in for NSTEMI and was transferred to [facility] for evaluation. Patient was initially admitted to the cardiology service.  He previously received his cardiology care by Dr. Clayborn Bigness. He was hospitalized earlier in March of 2016 with angina, NSTEMI and had a cardiac catheterization, which showed two-vessel disease not amenable to intervention. Medical management was recommended.   He re-presented to [facility] on 07/04/14 with syncope while at his PCP's office. This hospitalization was further complicated by hypotension, right lower lobe pneumonia, acute hypoxic respiratory failure requiring BiPAP, acute renal failure, bacteremia ( gram + cocci), afib with RVR and NSTEMI. During his admission he developed further chest pain and his troponin was noted to be elevated and peaked around 11. His cardiologist, Dr. Clayborn Bigness, was notified and he recommended CABG. He was transferred to [facility] for further evaluation. He was transferred on IV heparin and IV Vanc.   Patient was noted to have a viridans group streptococcal bacteremia and endocarditis. ID was consulted. Patient was placed empirically on IV vancomycin. Patient will need at least 4 weeks of IV vancomycin as per ID recommendations. He remained  afebrile. WBC normalized. Patient was seen in consultation by cardiothoracic surgery for further evaluation of endocarditis and aortic valve and severe aortic valvular regurgitation. He has had teeth extraction last week. He is to continue on IV vancomycin through 08/03/2012. Patient will follow-up with ID as outpatient.  His hospitalization was complicated by right knee pain - MRI of the right knee with tricompartmental osteoarthritis most advanced within the medial compartment. Small complex knee joint effusion no specific signs of joint infection osteomyelitis on noncontrast imaging. Extensive degenerative tearing of the medial meniscus. Anterior cruciate ligament mucoid degeneration. Treated with pain management.   Noted to have severe aortic insufficiency/aortic valvular endocarditis during his work up for his bacteremia and non-STEMI.  Patient may need a aVR/CABG. Patient has been off anticoagulation secondary to recent GI bleed and is too high risk for colonoscopy. Patient will need several weeks of IV antibiotics through 08/04/2014 per ID recommendations. Anticoagulation was resumed. Cardiothoracic surgery recommended patient be seen by nutrition for his malnutrition and continue antibiotic treatment for his endocarditis while his dental workup and treatment is done.   Patient was seen by Dr. Cyndia Bent who noted: The patient has strep viridans aortic valve endocarditis with severe AI presenting with a several month history of chills, poor appetite and weight loss. He also has severe TR with no apparent vegetation on that valve and the valve structurally looks good. He has moderate CAD with no significant stenosis in the LAD but a significant antero-apical wall motion abnormality on LV gram. That could be due to embolic disease from the endocarditis. The OM is occluded but small and  probably not graftable. The distal RCA has significant disease that could be grafted. He has bad teeth and will need to be  seen by Dr. Lawana Chambers and will probably need to have the remaining teeth pulled. I will call him. He has been hemodynamically stable and tolerating the AI well so urgent surgery not indicated and I think his teeth should be dealt with first. His LV function on the recent echos looks better than the cath. He has also had this right knee pain and had a joint aspiration with only a small amount of fluid that had a negative gram stain and negative culture so far. An MR did show some osteoarthritis and extensive degenerative tearing of the medial meniscus but no specific signs of infection or osteo. He has severe malnutrition and will benefit from a period of nutrition and antibiotic treatment of his endocarditis while his dental workup and treatment is done. I will ask nutritionist to see him. I will continue to follow his progress and decide when to do surgery.  He did have a GI bleed during this last admission and was seen in consultation by gastroenterology. IV heparin was discontinued. It was felt patient was too high risk for colonoscopy and patient likely had a diverticular bleed. Patient was monitored his bleeding subsided and resolved. Patient's hemoglobin remained stable. Patient's diet was advanced and he was tolerating a solid diet by day of discharge. Patient's anticoagulation was resumed and patient was monitored for 24 hours on anticoagulation and did not have any further bleeding.   2-D echo showed an EF of 55-60% with grade 2 diastolic dysfunction.   Cath report noted to have LAD moderate disease. Left circumflex had a tight stenosis but not a good candidate for CABG. Subtotal OM. RCA had distal disease or could potentially be stented.   TEE with a EF of 60% severe aortic regurgitation, severe tricuspid regurgitation, vegetation on aortic valve.   Also with right lower lobe pneumonia and was treated on antibiotics.   He was in atrial fib - managed with rate control.  Patient received a full  course of antibiotic therapy during the hospitalization. His bleeding resolved - his hemoglobin stabilized and patient was subsequently started back on eliquis per cardiology due to concerns for increased risk for septic emboli and possible CVA .   Comes in today. Here with his wife. Seems to be holding his own. Not doing much. Pretty sedentary. No fever. No cough. Short of breath at night if he tries to lie down according to his wife - not by him - he denied being short of breath. Weight is stable. No chest pain. No bleeding. Wants to stop some medicines. Had his teeth extracted. Soft diet tolerated.   Past Medical History  Diagnosis Date  . Hypertension   . Myocardial infarction   . CHF (congestive heart failure)   . Shortness of breath dyspnea   . Arthritis     KNEES  . Heart murmur   . Pneumonia     hx 3/15  . GERD (gastroesophageal reflux disease)     occ  . Status post PICC central line placement     placed for vanco infusion rt arm    Past Surgical History  Procedure Laterality Date  . Tee without cardioversion N/A 07/11/2014    Procedure: TRANSESOPHAGEAL ECHOCARDIOGRAM (TEE);  Surgeon: Josue Hector, MD;  Location: [facility] ENDOSCOPY;  Service: Cardiovascular;  Laterality: N/A;  . Tonsillectomy    . Multiple extractions with alveoloplasty  N/A 07/20/2014    Procedure: Extraction of tooth #'s 2,6,8,14,21,22,23,27,28,30 with alveoloplasty ;  Surgeon: Lenn Cal, DDS;  Location: [place];  Service: Oral Surgery;  Laterality: N/A;     Medications: Current Outpatient Prescriptions  Medication Sig Dispense Refill  . amiodarone (PACERONE) 200 MG tablet Take 1 tablet (200 mg total) by mouth daily. 30 tablet 0  . digoxin (LANOXIN) 0.125 MG tablet Take 125 mcg by mouth daily.  6  . ELIQUIS 5 MG TABS tablet Take 5 mg by mouth 2 (two) times daily.     . feeding supplement, ENSURE ENLIVE, (ENSURE ENLIVE) LIQD Take 237 mLs by mouth 2 (two) times daily between meals. 237 mL 12  . furosemide  (LASIX) 40 MG tablet Take 1 tablet (40 mg total) by mouth daily. 30 tablet 0  . HYDROcodone-acetaminophen (NORCO/VICODIN) 5-325 MG per tablet Take 1 tablet by mouth every 4 (four) hours as needed.   0  . metoprolol tartrate (LOPRESSOR) 50 MG tablet Take 1 tablet (50 mg total) by mouth 2 (two) times daily. 62 tablet 0  . NITROSTAT 0.4 MG SL tablet Take 0.4 mg by mouth. Dissolve 1 tablet under the tongue every 5 minutes as needed for chest pain up to 3 times.  0  . pantoprazole (PROTONIX) 40 MG tablet Take 1 tablet (40 mg total) by mouth daily. 30 tablet 0  . potassium chloride SA (K-DUR,KLOR-CON) 20 MEQ tablet Take 2 tablets (40 mEq total) by mouth daily. 30 tablet 0  . sodium chloride 0.9 % infusion Inject into the vein once.     . traMADol (ULTRAM) 50 MG tablet Take 50 mg by mouth 3 (three) times daily as needed for moderate pain.    . vancomycin (VANCOCIN) 10 G SOLR injection Inject into the vein daily.     . Vancomycin (VANCOCIN) 750 MG/150ML SOLN Inject 150 mLs (750 mg total) into the vein every 12 (twelve) hours. 6300 mL 0   No current facility-administered medications for this visit.    Allergies: Allergies  Allergen Reactions  . Lipitor [Atorvastatin] Other (See Comments)    Abdominal pain, nausea and chest pain  . Penicillins     Difficulty breathing    Social History: The patient  reports that he has never smoked. He has never used smokeless tobacco. He reports that he drinks about 0.6 oz of alcohol per week. He reports that he does not use illicit drugs.   Family History: The patient's family history includes Coronary artery disease in his brother.   Review of Systems: Please see the history of present illness.   Otherwise, the review of systems is positive for appetite change, DOE, and headaches.   All other systems are reviewed and negative.   Physical Exam: VS:  BP 128/40 mmHg  Pulse 60  Ht 6' (1.829 m)  Wt 182 lb 6.4 oz (82.736 kg)  BMI 24.73 kg/m2 .  BMI Body mass  index is 24.73 kg/(m^2).  Wt Readings from Last 3 Encounters:  07/25/14 182 lb 6.4 oz (82.736 kg)  07/20/14 181 lb (82.101 kg)  07/19/14 181 lb (82.101 kg)    General: Pleasant. Looks chronically ill but in no acute distress.  HEENT: Normal. Neck: Supple, no JVD, carotid bruits, or masses noted.  Cardiac: Regular rate and rhythm. Systolic murmur noted. Heart sounds are distant. No edema.  Respiratory:  Lungs are decreased with normal work of breathing.  GI: Soft and nontender.  MS: No deformity or atrophy. Gait and ROM intact.  Skin: Warm and dry. Color is sallow. Neuro:  Strength and sensation are intact and no gross focal deficits noted.  Psych: Alert, appropriate and with normal affect.   LABORATORY DATA:  EKG:  EKG is ordered today. This demonstrates sinus brady with inferolateral T wave changes.  QT 464.    Lab Results  Component Value Date   WBC 11.3* 07/19/2014   HGB 12.4* 07/19/2014   HCT 38.8* 07/19/2014   PLT 228 07/19/2014   GLUCOSE 114* 07/19/2014   CHOL 82 07/07/2014   TRIG 103 07/07/2014   HDL 18* 07/07/2014   LDLCALC 43 07/07/2014   ALT 24 07/10/2014   AST 33 07/10/2014   NA 136 07/19/2014   K 4.5 07/19/2014   CL 104 07/19/2014   CREATININE 1.58* 07/19/2014   BUN 18 07/19/2014   CO2 23 07/19/2014   TSH 2.343 07/06/2014   INR 1.21 07/07/2014   HGBA1C 5.9* 07/06/2014    BNP (last 3 results)  Recent Labs  07/06/14 1940  BNP 779.9*    ProBNP (last 3 results) No results for input(s): PROBNP in the last 8760 hours.   Other Studies Reviewed Today:  TEE Study Conclusions  - Impressions: Mild LVE EF 60% Aortic valve SBE with small marantic like vegetations no abscess Severe AR Regurgitation largest though commisure between right and non coronary cusps. Mild MR Severe TR estimated PA pressure 74 mmHg Normla PV LAE No aortic debris  Recommend Rx SBE with 6 weeks of antibiotics and consideration for repeat TEE  possible need for surgery given severe AR Future TEE;s need propofol as intubation of esophagus very difficult  Jenkins Rouge   TTE Study Conclusions  - Left ventricle: The cavity size was mildly dilated. There was mild concentric hypertrophy. Systolic function was normal. The estimated ejection fraction was in the range of 50% to 55%. Features are consistent with a pseudonormal left ventricular filling pattern, with concomitant abnormal relaxation and increased filling pressure (grade 2 diastolic dysfunction). - Regional wall motion abnormality: Mild hypokinesis of the mid-apical anterior, mid anterolateral, apical lateral, and apical myocardium. - Aortic valve: Trileaflet; mildly thickened, mildly calcified leaflets. There was no stenosis. There was moderate to severe regurgitation. - Mitral valve: Mildly thickened leaflets . There was mild regurgitation. - Left atrium: The atrium was mildly dilated. Volume/bsa, ES, (1-plane Simpson&'s, A2C): 30.1 ml/m^2. - Right ventricle: Systolic function was mildly reduced. - Right atrium: The atrium was mildly dilated. - Tricuspid valve: There was moderate-severe regurgitation. - Pulmonary arteries: PA peak pressure: 69 mm Hg (S). Severely elevated pulmonary pressures. - Inferior vena cava: The vessel was dilated. The respirophasic diameter changes were blunted (< 50%), consistent with elevated central venous pressure.   Assessment/Plan: 1. SBE - on IV Vancoymycin til 08/04/2014. Discussed with Dr. Acie Fredrickson here in the office today. Seeing Dr. Cyndia Bent just prior to finishing. Seeing ID tomorrow. If needs further imaging studies once antibiotics are complete, we will be happy to arrange. I have not given him a follow up back here yet until we know a more definitive treatment plan.   2. CAD - recent MI - initially to treat medically - no symptoms at present  3. Prior LV dysfunction - last echo with  normalization  4. PAF - now on amiodarone -  Holding in sinus by exam and EKG today.  5. Prior GI bleed - back on Eliquis - no bleeding reported. Checking labs today.   6. Prior pneumonia  7. Malnourished  Seems to be holding  his own for now - overall prognosis looks tenuous at best.   Current medicines are reviewed with the patient today.  The patient does not have concerns regarding medicines other than what has been noted above.  The following changes have been made:  See above.  Labs/ tests ordered today include:    Orders Placed This Encounter  Procedures  . Brain natriuretic peptide  . Basic metabolic panel  . CBC     Disposition:   Further disposition to follow.   Patient is agreeable to this plan and will call if any problems develop in the interim.   Signed: Burtis Junes, RN, ANP-C 07/25/2014 10:44 AM  [facility] [address] [place], [place]  [address] Phone: [phone] Fax: [phone]

## 2014-07-25 NOTE — Patient Instructions (Signed)
We will be checking the following labs today CBC, BMET and BNP   Medication Instructions: Continue with your current medicines.     Testing/Procedures to be arranged: None at this time    Follow-Up: We will arrange a follow up based on your visit with Dr. Cyndia Bent   Other Special Instructions: N/A  Call the [place] office at [phone] if you have any questions, problems or concerns.

## 2014-07-26 ENCOUNTER — Inpatient Hospital Stay: Payer: Commercial Managed Care - HMO | Admitting: Infectious Disease

## 2014-07-26 ENCOUNTER — Other Ambulatory Visit: Payer: Self-pay | Admitting: *Deleted

## 2014-07-26 DIAGNOSIS — I5022 Chronic systolic (congestive) heart failure: Secondary | ICD-10-CM

## 2014-07-26 MED ORDER — FUROSEMIDE 40 MG PO TABS: 60.0000 mg | ORAL_TABLET | Freq: Every day | ORAL | Status: DC

## 2014-07-27 ENCOUNTER — Emergency Department (HOSPITAL_COMMUNITY)
Admission: EM | Admit: 2014-07-27 | Discharge: 2014-07-27 | Disposition: A | Discharge status: Home / Self Care | Payer: Commercial Managed Care - HMO | Attending: Emergency Medicine | Admitting: Emergency Medicine

## 2014-07-27 ENCOUNTER — Encounter (HOSPITAL_COMMUNITY): Payer: Self-pay | Admitting: Emergency Medicine

## 2014-07-27 DIAGNOSIS — R000 Tachycardia, unspecified: Secondary | ICD-10-CM | POA: Insufficient documentation

## 2014-07-27 DIAGNOSIS — Z9889 Other specified postprocedural states: Secondary | ICD-10-CM | POA: Diagnosis not present

## 2014-07-27 DIAGNOSIS — K91840 Postprocedural hemorrhage and hematoma of a digestive system organ or structure following a digestive system procedure: Secondary | ICD-10-CM | POA: Diagnosis not present

## 2014-07-27 DIAGNOSIS — Z792 Long term (current) use of antibiotics: Secondary | ICD-10-CM | POA: Diagnosis not present

## 2014-07-27 DIAGNOSIS — Z7902 Long term (current) use of antithrombotics/antiplatelets: Secondary | ICD-10-CM | POA: Diagnosis not present

## 2014-07-27 DIAGNOSIS — M170 Bilateral primary osteoarthritis of knee: Secondary | ICD-10-CM | POA: Insufficient documentation

## 2014-07-27 DIAGNOSIS — Y838 Other surgical procedures as the cause of abnormal reaction of the patient, or of later complication, without mention of misadventure at the time of the procedure: Secondary | ICD-10-CM | POA: Insufficient documentation

## 2014-07-27 DIAGNOSIS — Z8701 Personal history of pneumonia (recurrent): Secondary | ICD-10-CM | POA: Insufficient documentation

## 2014-07-27 DIAGNOSIS — Z79899 Other long term (current) drug therapy: Secondary | ICD-10-CM | POA: Diagnosis not present

## 2014-07-27 DIAGNOSIS — R011 Cardiac murmur, unspecified: Secondary | ICD-10-CM | POA: Insufficient documentation

## 2014-07-27 DIAGNOSIS — K219 Gastro-esophageal reflux disease without esophagitis: Secondary | ICD-10-CM | POA: Insufficient documentation

## 2014-07-27 DIAGNOSIS — Z880 Allergy status to penicillin: Secondary | ICD-10-CM | POA: Insufficient documentation

## 2014-07-27 DIAGNOSIS — K08109 Complete loss of teeth, unspecified cause, unspecified class: Secondary | ICD-10-CM | POA: Diagnosis not present

## 2014-07-27 DIAGNOSIS — I252 Old myocardial infarction: Secondary | ICD-10-CM | POA: Diagnosis not present

## 2014-07-27 DIAGNOSIS — IMO0002 Reserved for concepts with insufficient information to code with codable children: Secondary | ICD-10-CM

## 2014-07-27 DIAGNOSIS — I509 Heart failure, unspecified: Secondary | ICD-10-CM | POA: Diagnosis not present

## 2014-07-27 DIAGNOSIS — I10 Essential (primary) hypertension: Secondary | ICD-10-CM | POA: Insufficient documentation

## 2014-07-27 LAB — CBC WITH DIFFERENTIAL/PLATELET
Basophils Absolute: 0.1 10*3/uL (ref 0.0–0.1)
Basophils Relative: 1 % (ref 0–1)
Eosinophils Absolute: 0.1 10*3/uL (ref 0.0–0.7)
Eosinophils Relative: 2 % (ref 0–5)
HCT: 33.8 % — ABNORMAL LOW (ref 39.0–52.0)
HEMOGLOBIN: 10.7 g/dL — AB (ref 13.0–17.0)
Lymphocytes Relative: 18 % (ref 12–46)
Lymphs Abs: 1 10*3/uL (ref 0.7–4.0)
MCH: 26 pg (ref 26.0–34.0)
MCHC: 31.7 g/dL (ref 30.0–36.0)
MCV: 82.2 fL (ref 78.0–100.0)
MONO ABS: 0.8 10*3/uL (ref 0.1–1.0)
MONOS PCT: 14 % — AB (ref 3–12)
NEUTROS ABS: 3.7 10*3/uL (ref 1.7–7.7)
NEUTROS PCT: 65 % (ref 43–77)
PLATELETS: 234 10*3/uL (ref 150–400)
RBC: 4.11 MIL/uL — AB (ref 4.22–5.81)
RDW: 19.7 % — ABNORMAL HIGH (ref 11.5–15.5)
WBC: 5.7 10*3/uL (ref 4.0–10.5)

## 2014-07-27 LAB — BASIC METABOLIC PANEL
ANION GAP: 10 (ref 5–15)
BUN: 24 mg/dL — ABNORMAL HIGH (ref 6–23)
CALCIUM: 8.5 mg/dL (ref 8.4–10.5)
CHLORIDE: 105 mmol/L (ref 96–112)
CO2: 23 mmol/L (ref 19–32)
CREATININE: 1.62 mg/dL — AB (ref 0.50–1.35)
GFR calc Af Amer: 46 mL/min — ABNORMAL LOW (ref >90)
GFR calc non Af Amer: 40 mL/min — ABNORMAL LOW (ref >90)
GLUCOSE: 129 mg/dL — AB (ref 70–99)
Potassium: 3.9 mmol/L (ref 3.5–5.1)
Sodium: 138 mmol/L (ref 135–145)

## 2014-07-27 MED ORDER — SODIUM CHLORIDE 0.9 % IV BOLUS (SEPSIS)
500.0000 mL | Freq: Once | INTRAVENOUS | Status: AC
  Administered 2014-07-27: 500 mL via INTRAVENOUS

## 2014-07-27 NOTE — Discharge Instructions (Signed)
Do not take any Eliquis for the next 48 hours. Keep your scheduled appointment with Dr.Kulinski in 4 days. If bleeding recurs and you cannot get it stopped, call Dr.Kulinski to be seen sooner or return here

## 2014-07-27 NOTE — ED Provider Notes (Addendum)
CSN: [id number]     Arrival date & time 07/27/14  6761 History   First MD Initiated Contact with Patient 07/27/14 [phone]     Chief Complaint  Patient presents with  . Nausea  . bleeding from dental surgery      (Consider location/radiation/quality/duration/timing/severity/associated sxs/prior Treatment) HPI Patient developed bleeding from his dental extraction sites this morning. Bleeding stopped after he packed his gums with gauze. He vomited one time , hematemesis. He no longer complains of nausea and is asymptomatic. Patient had all of his teeth extracted 1 week ago.. Eliquis was temporarily stopped and restarted again 3 days ago. Patient has since pulled out the packing with no further bleeding he is now asymptomatic Past Medical History  Diagnosis Date  . Hypertension   . Myocardial infarction   . CHF (congestive heart failure)   . Shortness of breath dyspnea   . Arthritis     KNEES  . Heart murmur   . Pneumonia     hx 3/15  . GERD (gastroesophageal reflux disease)     occ  . Status post PICC central line placement     placed for vanco infusion rt arm   Past Surgical History  Procedure Laterality Date  . Tee without cardioversion N/A 07/11/2014    Procedure: TRANSESOPHAGEAL ECHOCARDIOGRAM (TEE);  Surgeon: Josue Hector, MD;  Location: [facility];  Service: Cardiovascular;  Laterality: N/A;  . Tonsillectomy    . Multiple extractions with alveoloplasty N/A 07/20/2014    Procedure: Extraction of tooth #'s 2,6,8,14,21,22,23,27,28,30 with alveoloplasty ;  Surgeon: Lenn Cal, DDS;  Location: [place];  Service: Oral Surgery;  Laterality: N/A;   Family History  Problem Relation Age of Onset  . Coronary artery disease Brother    History  Substance Use Topics  . Smoking status: Never Smoker   . Smokeless tobacco: Never Used  . Alcohol Use: 0.6 oz/week    1 Cans of beer per week    Review of Systems  Constitutional: Negative.   HENT: Negative.   Respiratory:  Negative.   Cardiovascular: Negative.   Gastrointestinal: Negative.   Musculoskeletal: Negative.   Skin: Negative.   Neurological: Negative.   Hematological: Bruises/bleeds easily.  Psychiatric/Behavioral: Negative.   All other systems reviewed and are negative.     Allergies  Lipitor and Penicillins  Home Medications   Prior to Admission medications   Medication Sig Start Date End Date Taking? Authorizing Provider  amiodarone (PACERONE) 200 MG tablet Take 1 tablet (200 mg total) by mouth daily. 07/15/14   Eugenie Filler, MD  digoxin (LANOXIN) 0.125 MG tablet Take 125 mcg by mouth daily. 06/17/14   Historical Provider, MD  ELIQUIS 5 MG TABS tablet Take 5 mg by mouth 2 (two) times daily.  07/18/14   Historical Provider, MD  feeding supplement, ENSURE ENLIVE, (ENSURE ENLIVE) LIQD Take 237 mLs by mouth 2 (two) times daily between meals. 07/15/14   Eugenie Filler, MD  furosemide (LASIX) 40 MG tablet Take 1.5 tablets (60 mg total) by mouth daily. 07/26/14   Burtis Junes, NP  HYDROcodone-acetaminophen (NORCO/VICODIN) 5-325 MG per tablet Take 1 tablet by mouth every 4 (four) hours as needed.  07/20/14   Historical Provider, MD  metoprolol tartrate (LOPRESSOR) 50 MG tablet Take 1 tablet (50 mg total) by mouth 2 (two) times daily. 07/15/14   Eugenie Filler, MD  NITROSTAT 0.4 MG SL tablet Take 0.4 mg by mouth. Dissolve 1 tablet under the tongue every 5 minutes  as needed for chest pain up to 3 times. 06/23/14   Historical Provider, MD  pantoprazole (PROTONIX) 40 MG tablet Take 1 tablet (40 mg total) by mouth daily. 07/15/14   Eugenie Filler, MD  potassium chloride SA (K-DUR,KLOR-CON) 20 MEQ tablet Take 2 tablets (40 mEq total) by mouth daily. 07/15/14   Eugenie Filler, MD  sodium chloride 0.9 % infusion Inject into the vein once.  07/15/14   Historical Provider, MD  traMADol (ULTRAM) 50 MG tablet Take 50 mg by mouth 3 (three) times daily as needed for moderate pain.    Historical Provider, MD   vancomycin (VANCOCIN) 10 G SOLR injection Inject into the vein daily.  07/15/14   Historical Provider, MD  Vancomycin (VANCOCIN) 750 MG/150ML SOLN Inject 150 mLs (750 mg total) into the vein every 12 (twelve) hours. 07/14/14 08/04/14  Eugenie Filler, MD   BP 159/58 mmHg  Pulse 97  Temp(Src) 97.8 F (36.6 C) (Oral)  Resp 34  Wt 182 lb (82.555 kg)  SpO2 97% Physical Exam  Constitutional: He appears well-developed and well-nourished.  HENT:  Head: Normocephalic and atraumatic.  Edentulous no active bleeding  Eyes: Conjunctivae are normal. Pupils are equal, round, and reactive to light.  Neck: Neck supple. No tracheal deviation present. No thyromegaly present.  Cardiovascular: Normal rate.   No murmur heard. Irregularly irregular mildly tachycardic  Pulmonary/Chest: Effort normal and breath sounds normal.  Abdominal: Soft. Bowel sounds are normal. He exhibits no distension. There is no tenderness.  Musculoskeletal: Normal range of motion. He exhibits no edema or tenderness.  Neurological: He is alert. Coordination normal.  Skin: Skin is warm and dry. No rash noted.  Psychiatric: He has a normal mood and affect.  Nursing note and vitals reviewed.   ED Course  Procedures (including critical care time) Labs Review Labs Reviewed - No data to display  Imaging Review No results found.   EKG Interpretation None     10:10 AM patient is alert no further bleeding. Not lightheaded on standing after treatment with intravenous saline bolus. Feels ready to go home Results for orders placed or performed during the hospital encounter of 07/27/14  CBC with Differential/Platelet  Result Value Ref Range   WBC 5.7 4.0 - 10.5 K/uL   RBC 4.11 (L) 4.22 - 5.81 MIL/uL   Hemoglobin 10.7 (L) 13.0 - 17.0 g/dL   HCT 33.8 (L) 39.0 - 52.0 %   MCV 82.2 78.0 - 100.0 fL   MCH 26.0 26.0 - 34.0 pg   MCHC 31.7 30.0 - 36.0 g/dL   RDW 19.7 (H) 11.5 - 15.5 %   Platelets 234 150 - 400 K/uL   Neutrophils  Relative % 65 43 - 77 %   Neutro Abs 3.7 1.7 - 7.7 K/uL   Lymphocytes Relative 18 12 - 46 %   Lymphs Abs 1.0 0.7 - 4.0 K/uL   Monocytes Relative 14 (H) 3 - 12 %   Monocytes Absolute 0.8 0.1 - 1.0 K/uL   Eosinophils Relative 2 0 - 5 %   Eosinophils Absolute 0.1 0.0 - 0.7 K/uL   Basophils Relative 1 0 - 1 %   Basophils Absolute 0.1 0.0 - 0.1 K/uL  Basic metabolic panel  Result Value Ref Range   Sodium 138 135 - 145 mmol/L   Potassium 3.9 3.5 - 5.1 mmol/L   Chloride 105 96 - 112 mmol/L   CO2 23 19 - 32 mmol/L   Glucose, Bld 129 (H) 70 - 99  mg/dL   BUN 24 (H) 6 - 23 mg/dL   Creatinine, Ser 1.62 (H) 0.50 - 1.35 mg/dL   Calcium 8.5 8.4 - 10.5 mg/dL   GFR calc non Af Amer 40 (L) >90 mL/min   GFR calc Af Amer 46 (L) >90 mL/min   Anion gap 10 5 - 15   Dg Orthopantogram  07/10/2014   CLINICAL DATA:  Bacteremia. Infection in the mouth. Symptoms for 2 months.  EXAM: ORTHOPANTOGRAM/PANORAMIC  COMPARISON:  None.  FINDINGS: Positioning is nonstandard. No evidence for acute fracture of the mandible. Majority of the maxillary teeth are absent. There is limited detail of the maxillary alveolar ridge, especially on the right side. Suspect caries of the left mandibular lateral incisor possibly associated with periapical abscess. There is limited detail regarding the central mandibular teeth.  IMPRESSION: 1. Limited detail. 2. Numerous absent teeth. 3. Suspect significant caries and possible periapical abscess of the left lateral mandibular incisor.   Electronically Signed   By: Nolon Nations M.D.   On: 07/10/2014 21:44   Dg Chest 2 View  07/06/2014   CLINICAL DATA:  Pneumonia.  Follow-up radiograph.  EXAM: CHEST  2 VIEW  COMPARISON:  07/05/2014.  FINDINGS: Cardiopericardial silhouette is enlarged. Improving airspace disease is present of both lung bases. This can be seen on the frontal projection in the retrocardiac region as well is in the RIGHT infrahilar region. Costophrenic angles are partially  excluded on the frontal views. Small bilateral pleural effusions later posteriorly/dependently. Monitoring leads project over the chest.  IMPRESSION: 1. Unchanged mild cardiomegaly. 2. Improving roughly symmetric medial bilateral basilar airspace disease favored to represent either edema or aspiration pneumonitis over pneumonia.   Electronically Signed   By: Dereck Ligas M.D.   On: 07/06/2014 18:02   Mr Knee Right Wo Contrast  07/12/2014   CLINICAL DATA:  Recent acute onset of knee pain. Limited range of motion. No fluid aspirated on attempted arthrocentesis. Initial encounter.  EXAM: MRI OF THE RIGHT KNEE WITHOUT CONTRAST  TECHNIQUE: Multiplanar, multisequence MR imaging of the knee was performed. No intravenous contrast was administered.  COMPARISON:  None.  FINDINGS: MENISCI  Medial meniscus: There is extensive degenerative tearing throughout the medial meniscus, primarily within the posterior horn and body. The meniscus is largely extruded from the joint.  Lateral meniscus:  Diffuse degeneration without focal tear.  LIGAMENTS  Cruciates:  Intact.  There is mild ACL mucoid degeneration.  Collaterals:  Intact.  CARTILAGE  Patellofemoral: Moderate patellofemoral degenerative changes with chondral thinning and osteophytes. No focal subchondral signal abnormality.  Medial: Advanced osteoarthritis with diffuse chondral thinning, osteophytes and subchondral eburnation.  Lateral: Moderate osteoarthritis with chondral thinning, surface irregularity and osteophytes.  Joint:  Minimal complex joint effusion.  Popliteal Fossa:  Unremarkable. No significant Baker's cyst.  Extensor Mechanism: Intact. There is moderate prepatellar subcutaneous edema without focal fluid collection. There is mild edema centrally in Hoffa's fat.  Bones: No significant extra-articular osseous findings. There is no cortical destruction or suspicious periarticular edema to suggest osteomyelitis.  IMPRESSION: 1. Tricompartmental osteoarthritis,  most advanced within the medial compartment. 2. Small complex knee joint effusion. No specific signs of joint infection or osteomyelitis on noncontrast imaging. 3. Extensive degenerative tearing of the medial meniscus. 4. ACL mucoid degeneration.   Electronically Signed   By: Richardean Sale M.D.   On: 07/12/2014 08:41   Dg Fluoro Guide Ndl Plcd/bx/inj/loc  07/10/2014   CLINICAL DATA:  Right knee swelling  EXAM: RIGHT KNEE ASPIRATION UNDER FLUOROSCOPY  FLUOROSCOPY TIME:  Radiation Exposure Index (as provided by the fluoroscopic device): 2.3 microGy*m^2  PROCEDURE: PROCEDURE  I discussed the risks (including hemorrhage, infection, and allergic reaction, among others), benefits, and alternatives to the procedure with the patient. We specifically discussed the likelihood of success of the procedure. The patient understood and elected to undergo the procedure.  Standard time-out was employed. The suprapatellar bursal region was wrapped with a compression bandage in order to force fluid down into the joint. Following sterile skin prep and local anesthetic administration consisting of 1% lidocaine, an 18 gauge needle was advanced into the knee joint from a medial approach below the patella. I obtained in image to show positioning. A total of 2 cc of sanguinous fluid was withdrawn. Despite repositioning so I was unable to get more fluid from the joint. The needle was subsequently removed and the skin cleansed and bandaged. No immediate complications were observed.  IMPRESSION: 1. Right knee arthrocentesis yielded 2 cc of sanguinous fluid. Despite repositioning side I was unable to get or joint fluid.   Electronically Signed   By: Van Clines M.D.   On: 07/10/2014 14:09   Attempted to contact Dr. Enrique Sack, DDS, no answer after several attempts .  MDM  I spoke with Dr.Croituru from cardiology service. Okay to stop Eliquis for 48 hours. Hematemesis is likely from swallowed blood.pt instructed to keep appt with  Dr. Darien Ramus 07/31/14 Final diagnoses:  None   Diagnosis #1postoperative bleeding #2 anemia     Orlie Dakin, MD 07/27/14 [place], MD 07/27/14 1021

## 2014-07-27 NOTE — ED Notes (Signed)
Pt has area of breakdown on spine - thoracic area-- stage 1-- open area approx size of pea. Covered with replicare. Dr. Lenna Sciara notified.

## 2014-07-27 NOTE — ED Notes (Addendum)
Had 20 teeth pulled last week -- pt states because of heart problems-- supposed to have valve replacement. Woke up spitting up blood from extraction area-- was swallowing blood -- nauseated at present-- rinsed mouth with salt water last night, states feels like sutures dissolved after that.   Restarted elaquis Monday.

## 2014-07-31 ENCOUNTER — Ambulatory Visit (HOSPITAL_COMMUNITY): Payer: Self-pay | Admitting: Dentistry

## 2014-07-31 ENCOUNTER — Encounter (HOSPITAL_COMMUNITY): Payer: Self-pay | Admitting: Dentistry

## 2014-07-31 DIAGNOSIS — K08109 Complete loss of teeth, unspecified cause, unspecified class: Secondary | ICD-10-CM

## 2014-07-31 NOTE — Patient Instructions (Signed)
PLAN:  1. Continue salt water rinses to aid healing.  2. The patient is now cleared for heart valve surgery from a dental standdpoint.  3. Return to medicine in 1 month for further evaluation of healing. Patient will then follow-up with a dentist of his choice for fabrication of upper and lower complete dentures if cleared medically by the heart surgeon.  4. The patient is aware that ideally impacted #32 should be removed prior to fabrication of lower denture, however, this may be contraindicated due to the patient's overall health and risks for complications.  Lenn Cal, DDS

## 2014-07-31 NOTE — Progress Notes (Signed)
POST OPERATIVE NOTE:  07/31/2014   Joe Kennedy [id number]  VITALS: BP 138/25 mmHg  Pulse 57  Temp(Src) 98.2 F (36.8 C) (Oral)  LABS:  Lab Results  Component Value Date   WBC 5.7 07/27/2014   HGB 10.7* 07/27/2014   HCT 33.8* 07/27/2014   MCV 82.2 07/27/2014   PLT 234 07/27/2014   BMET    Component Value Date/Time   NA 138 07/27/2014 0810   K 3.9 07/27/2014 0810   CL 105 07/27/2014 0810   CO2 23 07/27/2014 0810   GLUCOSE 129* 07/27/2014 0810   BUN 24* 07/27/2014 0810   CREATININE 1.62* 07/27/2014 0810   CALCIUM 8.5 07/27/2014 0810   GFRNONAA 40* 07/27/2014 0810   GFRAA 46* 07/27/2014 0810    Lab Results  Component Value Date   INR 1.21 07/07/2014   INR 1.22 07/06/2014   No results found for: PTT   Joe Kennedy is status post multiple extractions with alveoloplasty in the operating room on 07/20/2014.  SUBJECTIVE: Patient denies any significant complaints. Patient did have 1 episode of bleeding that required further evaluation by the emergency department.  However, by the time the patient return to the emergency department, the bleeding and stop. The patient had no further episodes of significant bleeding.   EXAM: There is no sign of infection, heme, or ooze. Sutures are loosely intact. Patient is healing in by generalized primary closure but there are several areas that are healing in by secondary intention. Patient is now edentulous.  PROCEDURE: The patient was given a chlorhexidine gluconate rinse for 30 seconds. Sutures were then removed without complication. Patient tolerated the procedure well.  ASSESSMENT: 1. Post operative course is consistent with dental procedures performed in the OR. 2. The patient is edentulous.  PLAN: 1. Continue salt water rinses to aid healing. 2. The patient is now cleared for heart valve surgery from a dental standdpoint. 3. Return to medicine in 1 month for further evaluation of healing. Patient will then  follow-up with a dentist of his choice for fabrication of upper and lower complete dentures if cleared medically by the heart surgeon.  4. The patient is aware that ideally impacted #32 should be removed prior to fabrication of lower denture, however, this may be contraindicated due to the patient's overall health and risks for complications.   Lenn Cal, DDS

## 2014-08-02 ENCOUNTER — Institutional Professional Consult (permissible substitution) (INDEPENDENT_AMBULATORY_CARE_PROVIDER_SITE_OTHER): Payer: Commercial Managed Care - HMO | Admitting: Surgery

## 2014-08-02 ENCOUNTER — Encounter: Payer: Self-pay | Admitting: Surgery

## 2014-08-02 ENCOUNTER — Other Ambulatory Visit (INDEPENDENT_AMBULATORY_CARE_PROVIDER_SITE_OTHER): Payer: Commercial Managed Care - HMO

## 2014-08-02 VITALS — BP 146/38 | HR 94 | Resp 22 | Ht 72.0 in | Wt 182.0 lb

## 2014-08-02 DIAGNOSIS — I351 Nonrheumatic aortic (valve) insufficiency: Secondary | ICD-10-CM | POA: Diagnosis not present

## 2014-08-02 DIAGNOSIS — I5022 Chronic systolic (congestive) heart failure: Secondary | ICD-10-CM

## 2014-08-02 DIAGNOSIS — I358 Other nonrheumatic aortic valve disorders: Secondary | ICD-10-CM | POA: Diagnosis not present

## 2014-08-02 LAB — BASIC METABOLIC PANEL
BUN: 18 mg/dL (ref 6–23)
CO2: 26 mEq/L (ref 19–32)
Calcium: 9.1 mg/dL (ref 8.4–10.5)
Chloride: 103 mEq/L (ref 96–112)
Creatinine, Ser: 1.5 mg/dL (ref 0.40–1.50)
GFR: 48.49 mL/min — ABNORMAL LOW (ref >60.00)
Glucose, Bld: 103 mg/dL — ABNORMAL HIGH (ref 70–99)
Potassium: 4.1 mEq/L (ref 3.5–5.1)
Sodium: 137 mEq/L (ref 135–145)

## 2014-08-02 LAB — BRAIN NATRIURETIC PEPTIDE: Pro B Natriuretic peptide (BNP): 1522 pg/mL — ABNORMAL HIGH (ref 0.0–100.0)

## 2014-08-03 ENCOUNTER — Other Ambulatory Visit: Payer: Self-pay | Admitting: *Deleted

## 2014-08-03 DIAGNOSIS — I339 Acute and subacute endocarditis, unspecified: Secondary | ICD-10-CM

## 2014-08-03 DIAGNOSIS — I39 Endocarditis and heart valve disorders in diseases classified elsewhere: Secondary | ICD-10-CM

## 2014-08-04 ENCOUNTER — Encounter: Payer: Self-pay | Admitting: Surgery

## 2014-08-04 NOTE — Progress Notes (Signed)
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 He also has severe TR with no apparent vegetation on that valve and the valve structurally looks good. He has moderate CAD with no significant stenosis in the LAD but a significant antero-apical wall motion abnormality on LV gram. That could be due to embolic disease from the endocarditis. His LV function on the recent echos looks better than the cath. The OM is occluded but small and probably not graftable. The distal RCA has significant disease that could be grafted. He had poor dentition and this was felt to be the likely cause of his endocarditis so he underwent extraction of all of his teeth by Dr. Enrique Sack on 07/20/2014. He tolerated this well and has been at home on home antibiotic therapy via a PICC line. He has also had this right knee pain and had a joint aspiration with only a small amount of fluid that had a negative gram stain and negative culture so far. An MR did show some osteoarthritis and extensive degenerative tearing of the medial meniscus but no specific signs of infection or osteo. He has severe malnutrition and says he has not been able to eat any solid food since dental extraction but his wife is trying to improve his nutrition with Ensure milkshakes and soup. He still feels weak but better. He has mild shortness of breath with exertion but has been able to lay flat and denies any peripheral edema. He has had no fever or chills.   Current Outpatient Prescriptions  Medication Sig Dispense Refill  . amiodarone (PACERONE) 200 MG tablet Take 1 tablet (200 mg total) by mouth daily. 30 tablet 0  . digoxin (LANOXIN) 0.125 MG tablet Take 125 mcg by mouth daily.  6  . ELIQUIS 5 MG TABS tablet Take 5 mg by mouth 2 (two) times daily.     . feeding supplement, ENSURE ENLIVE, (ENSURE ENLIVE) LIQD Take 237 mLs by  mouth 2 (two) times daily between meals. 237 mL 12  . furosemide (LASIX) 40 MG tablet Take 1.5 tablets (60 mg total) by mouth daily. 45 tablet 6  . HYDROcodone-acetaminophen (NORCO/VICODIN) 5-325 MG per tablet Take 1 tablet by mouth every 4 (four) hours as needed.   0  . metoprolol tartrate (LOPRESSOR) 50 MG tablet Take 1 tablet (50 mg total) by mouth 2 (two) times daily. 62 tablet 0  . NITROSTAT 0.4 MG SL tablet Take 0.4 mg by mouth. Dissolve 1 tablet under the tongue every 5 minutes as needed for chest pain up to 3 times.  0  . pantoprazole (PROTONIX) 40 MG tablet Take 1 tablet (40 mg total) by mouth daily. 30 tablet 0  . potassium chloride SA (K-DUR,KLOR-CON) 20 MEQ tablet Take 2 tablets (40 mEq total) by mouth daily. 30 tablet 0  . sodium chloride 0.9 % infusion Inject into the vein once.     . traMADol (ULTRAM) 50 MG tablet Take 50 mg by mouth 3 (three) times daily as needed for moderate pain.    . vancomycin (VANCOCIN) 10 G SOLR injection Inject into the vein daily.     . Vancomycin (VANCOCIN) 750 MG/150ML SOLN Inject 150 mLs (750 mg total) into the vein every 12 (twelve) hours. 6300 mL 0   No current facility-administered medications for this visit.     Physical Exam: BP 146/38 mmHg  Pulse  94  Resp 22  Ht 6' (1.829 m)  Wt 182 lb (82.555 kg)  BMI 24.68 kg/m2  SpO2 99% He looks tired but in no distress Cardiac exam shows a regular rate and rhythm with a 3/6 diastolic murmur along the LLSB. Lungs are clear There is no peripheral edema  Diagnostic Tests: None today  Impression:  He has been stable on home antibiotic therapy. The vancomycin is supposed to be finished on 08/04/2014 and he is going to return to see the ID consultant soon. He will require AVR and CABG for severe multi-vessel coronary disease and severe AI from endocarditis. Ideally it would be nice to let him recover a little longer from his dental extractions and improve his nutrition and mobility but with severe AI  I don't want to let him wait too long and have him return with failure to thrive or CHF.  Plan:  I will plan to get a repeat echo in 2 weeks and see him back in 3 weeks to assess how he is doing and schedule surgery. He is going to return to see the ID consultant soon.   Gaye Pollack, MD [facility] [phone]

## 2014-08-06 NOTE — Consult Note (Signed)
PATIENT NAME:  Joe Kennedy, Joe Kennedy MR#:  [id number] DATE OF BIRTH:  1941/01/16  DATE OF CONSULTATION:  06/22/2014  REFERRING PHYSICIAN:  Dr. Lance Coon.   PRIMARY PHYSICIAN:  Otilio Miu, M.D.   CONSULTING PHYSICIAN:  Jannine Abreu D. Betina Puckett, MD  INDICATION: Angina, possible non-STEMI.    HISTORY OF PRESENT ILLNESS:  Patient is a 74 year old male who presented to the Emergency Room with chest pain, persistent and recurrent, with arm numbness.  Described the pain as substernal, tightness, occasionally sharp, again radiating to both arms. Denied any prior cardiac history.  No jaw or back pain. He had some diaphoresis and nausea, but no vomiting. He has had this pain in the past about a week before, but did not come for medical attention.  The pain got severe enough when he tried to lay down and go to sleep.  It would not go away, so he came in.  He took aspirin, and then eventually called EMS. An EKG by EMS was nonspecific, but his initial troponin was 0.13, so he was admitted for further evaluation and care.   PAST MEDICAL HISTORY: Hypertension, atrial fibrillation, osteoarthritis.  MEDICATIONS:  He is on tramadol 50 three times a day as needed, Lopressor 25 twice a day, etodolac 500 twice a day, digoxin 0.125 a day, niacin 500 a day.   PAST SURGICAL HISTORY: He denies.   ALLERGIES: PENICILLIN.   FAMILY HISTORY:  Coronary artery disease.   SOCIAL HISTORY: Nonsmoker. Occasional alcohol consumption. Retired Dealer.   REVIEW OF SYSTEMS:  No blackout spells or syncope. Denies nausea or vomiting. No fever, no chills, no , no weight loos, no weight gain, no hemoptysis, hematemesis. No bright red blood per rectum. No vision change. Denies any change in sputum production or cough. He has had irregular heart beat and atrial fibrillation symptoms.  No significant syncope.  PHYSICAL EXAMINATION:  VITAL SIGNS: Blood pressure 160/80, pulse of 90, respiratory rate of 15, afebrile.  HEENT: Normocephalic,  atraumatic. Pupils equal and reactive to light.  NECK: Supple. No significant JVD, bruits or adenopathy.  LUNG EXAMINATION: Clear to auscultation and percussion. No significant wheeze, rhonchi, or rale.  HEART EXAMINATION: Regular rate and rhythm.  ABDOMEN:  Positive bowel sounds. No significant rebound, guarding, or tenderness.  EXTREMITY EXAMINATION: Within normal limits.  NEUROLOGICAL EXAMINATION: Intact.  SKIN EXAMINATION: Normal.  LABORATORY DATA:  White count of 11.6, hemoglobin of 9.9, hematocrit was 32, platelet count of 334,000.  Sodium of 137, potassium of 4.3, chloride of 108, CO2 24, BUN of 18, creatinine of 1.14, glucose of  Troponin 0.13.  Chest x-ray essentially negative.   ASSESSMENT:  1.  Possible non-Q-wave myocardial infarction. 2.  Elevated troponin. 3.  Chronic atrial fibrillation.  4.  Hypertension.  5.  Possible acute systolic failure based on chest x-ray.  6.  Mild obesity.  7.  Degenerative joint disease.   PLAN:  1.  Agree with admit. Rule out for myocardial infarction. Follow up troponins. Follow up EKGs. Consider echocardiogram. Would recommend short-term anticoagulation. Consider functional study versus cardiac catheterization.  2.  Chronic atrial fibrillation.  Patient should be considered for long-term anticoagulation. Recommend continued rate control.  3.  For hypertension, continue beta blockade therapy. Consider ACE inhibitor therapy.  4.  For congestive heart failure, consider diuretic therapy, ACE inhibitor therapy. Echocardiogram may be helpful.  5.  Consider statin therapy for possible hyperlipidemia. He is on Niaspan, but will consider a true statin.  6.  Degenerative joint disease with right knee  trouble. Would recommend orthopedic evaluation and possible x-rays.  7.  Would consider cardiac catheterization with the patient's seemingly unstable symptoms and elevated troponins, possible non-STEMI.   ____________________________ Loran Senters. Clayborn Bigness,  MD ddc:sp D: 06/22/2014 09:47:23 ET T: 06/22/2014 10:46:50 ET JOB#: [id number]  cc: Shereta Crothers D. Clayborn Bigness, MD, <Dictator> Yolonda Kida MD ELECTRONICALLY SIGNED 06/26/2014 13:32

## 2014-08-06 NOTE — Consult Note (Signed)
Chief Complaint:  Subjective/Chief Complaint Patient states to feel well denies any palpitations tachycardia no chest pain denies shortness of breath would like to go home soon.   VITAL SIGNS/ANCILLARY NOTES: **Vital Signs.:   18-Mar-16 04:31  Vital Signs Type Routine  Temperature Temperature (F) 99.3  Celsius 37.3  Temperature Source oral  Pulse Pulse 74  Respirations Respirations 18  Systolic BP Systolic BP 076  Diastolic BP (mmHg) Diastolic BP (mmHg) 59  Mean BP 86  Pulse Ox % Pulse Ox % 98  Pulse Ox Activity Level  At rest  Oxygen Delivery Room Air/ 21 %  *Intake and Output.:   Shift 18-Mar-16 07:00  Grand Totals Intake:  1135.8 Output:  500    Net:  635.8 24 Hr.:  -24.2  [place]      In:  99.8  IV (Primary)      In:  900  IV (Secondary)      In:  136  Urine ml     Out:  500  Length of Stay Totals Intake:  1375.8 Output:  1400    Net:  -24.2   Brief Assessment:  GEN well developed, well nourished, no acute distress   Cardiac Irregular  murmur present  -- JVD   Respiratory normal resp effort  clear BS   Gastrointestinal Normal   Gastrointestinal details normal Soft  Nontender  Nondistended   EXTR negative cyanosis/clubbing, negative edema   Lab Results: Routine Chem:  18-Mar-16 06:17   Cholesterol, Serum 114 (0-200 NOTE: New Reference Range  05/30/14)  Triglycerides, Serum 120 (0-149 NOTE: New Reference Range  05/30/14)  HDL (INHOUSE)  16 (41-135 NOTE: New Reference Range:  05/30/14)  VLDL Cholesterol Calculated 24 (0-40 NOTE: New Reference Range  05/30/14)  LDL Cholesterol Calculated 74 (0-99 NOTE: New Reference Range  05/30/14)  Glucose, Serum 97 (65-99 NOTE: New Reference Range  05/30/14)  BUN 15 (6-20 NOTE: New Reference Range  05/30/14)  Creatinine (comp) 1.03 (0.61-1.24 NOTE: New Reference Range  05/30/14)  Sodium, Serum 137 (135-145 NOTE: New Reference Range  05/30/14)  Potassium, Serum 3.6 (3.5-5.1 NOTE: New Reference Range  05/30/14)  Chloride, Serum 108 (101-111 NOTE: New Reference Range  05/30/14)  CO2, Serum 24 (22-32 NOTE: New Reference Range  05/30/14)  Calcium (Total), Serum  7.9 (8.9-10.3 NOTE: New Reference Range  05/30/14)  Anion Gap  5  eGFR (African American) >60  eGFR (Non-African American) >60 (eGFR values <43m/min/1.73 m2 may be an indication of chronic kidney disease (CKD). Calculated eGFR is useful in patients with stable renal function. The eGFR calculation will not be reliable in acutely ill patients when serum creatinine is changing rapidly. It is not useful in patients on dialysis. The eGFR calculation may not be applicable to patients at the low and high extremes of body sizes, pregnant women, and vegetarians.)  Routine Hem:  18-Mar-16 06:17   WBC (CBC) 7.7  RBC (CBC)  3.59  Hemoglobin (CBC)  9.1  Hematocrit (CBC)  28.1  Platelet Count (CBC) 282  MCV  78  MCH  25.4  MCHC 32.5  RDW  15.2  Neutrophil % 74.9  Lymphocyte % 14.1  Monocyte % 7.8  Eosinophil % 2.1  Basophil % 1.1  Neutrophil # 5.8  Lymphocyte # 1.1  Monocyte # 0.6  Eosinophil # 0.2  Basophil # 0.1 (Result(s) reported on 23 Jun 2014 at 07:11AM.)   Radiology Results: XRay:    17-Mar-16 02:39, Chest PA and Lateral  Chest PA and Lateral  REASON FOR EXAM:    shortness of breath  COMMENTS:       PROCEDURE: DXR - DXR CHEST PA (OR AP) AND LATERAL  - Jun 22 2014  2:39AM     CLINICAL DATA:  Dyspnea and chest pain    EXAM:  CHEST  2 VIEW    COMPARISON:  None.    FINDINGS:  There is mild cardiomegaly. There is mild vascular and interstitial  prominence which may represent mild congestive heart failure. There  are no effusions. There is no dense airspace consolidation.     IMPRESSION:  Cardiomegaly and mild vascular/ interstitial prominence. This may  represent mild congestive heart failure.      Electronically Signed    By: Andreas Newport M.D.    On: 06/22/2014 02:45         Verified By:  Andreas Newport, M.D.,  Cardiology:    17-Mar-16 01:58, ED ECG  Ventricular Rate 110  Atrial Rate 110  P-R Interval 164  QRS Duration 100  QT 350  QTc 473  P Axis 33  R Axis -41  T Axis 72  ECG interpretation   Sinus tachycardia  Left axis deviation  Nonspecific ST abnormality  Abnormal ECG  No previous ECGs available  ----------unconfirmed----------  Confirmed by OVERREAD, NOT ([id number]), editor PEARSON, BARBARA ([id number]) on 06/23/2014 8:06:27 AM  ED ECG     17-Mar-16 10:06, Echo Doppler  Echo Doppler   REASON FOR EXAM:      COMMENTS:       PROCEDURE: [facility] - ECHO DOPPLER COMPLETE(TRANSTHOR)  - Jun 22 2014 10:06AM     RESULT: Echocardiogram Report    Patient Name:   Joe Kennedy Date of Exam: 06/22/2014  Medical Rec #:  [id number]          Custom1:  Date of Birth:  11/13/1940       Height:       73.0 in  Patient Age:    74 years        Weight:       187.0 lb  Patient Gender: M               BSA:          2.09 m??    Indications: MI  Sonographer:    Sherrie Sport RDCS  Referring Phys: Lance Coon, F    Summary:   1. Left ventricular ejection fraction, by visual estimation, is 50 to   55%.   2. Low normal global left ventricular systolic function.   3. Mild left ventricular hypertrophy.   4. Mildly increased left ventricular internal cavity size.   5. Normal right ventricular size and systolic function.   6. Aortic valve not well visualized, unable to exclude bicuspid aortic   valve   7. Mild to moderate dilatation of the aortic root and ascending aorta.   8. Mildly dilated left atrium.   9. Mildto moderate aortic regurgitation.  10. Mild tricuspid regurgitation.  11. Mild mitral valve regurgitation.  12. Borderline elevated RVSP  2D AND M-MODE MEASUREMENTS (normal ranges within parentheses):  Left Ventricle:         Normal  IVSd (2D):      1.46 cm (0.7-1.1)  LVPWd (2D):     1.08 cm (0.7-1.1) Aorta/LA:                 Normal  LVIDd (2D):     5.71 cm (3.4-5.7) Aortic Root  (2D): 4.03 cm (  2.4-3.7)  LVIDs (2D):     4.66 cm           Left Atrium (2D): 4.50 cm (1.9-4.0)  LV FS (2D):     18.4 % (>25%)  LV EF (2D):     37.6 %  (>50%)                                    Right Ventricle:                                    RVd (2D):        5.28 cm  LV DIASTOLIC FUNCTION:  MV Peak E: 0.89 m/s E/e' Ratio: 11.20                      Decel Time: 183 msec  SPECTRAL DOPPLER ANALYSIS (where applicable):  Mitral Valve:  MV P1/2 Time: 53.07 msec  MV Area, PHT: 4.15 cm??  Aortic Valve: AoV Max Vel: 1.14 m/s AoV Peak PG: 5.2 mmHg AoV Mean PG:  LVOT Vmax: 0.81 m/s LVOT VTI:  LVOT Diameter: 2.00 cm  AoV Area, Vmax: 2.24 cm?? AoV Area, VTI:  AoV Area, Vmn:  Aortic Insufficiency:  AI Half-time:  448 msec  AI Decel Rate: 2.19 m/s??  Tricuspid Valve and PA/RV Systolic Pressure: TR Max Velocity: 2.64 m/s RA   Pressure: 5 mmHg RVSP/PASP: 32.9 mmHg  Pulmonic Valve:  PV Max Velocity: 0.79 m/s PV Max PG: 2.5 mmHg PV Mean PG:  PHYSICIAN INTERPRETATION:  Left Ventricle: The left ventricular internal cavity size was mildly   increased. LV posterior wall thickness was normal. Mild left ventricular   hypertrophy. Global LV systolic function was low normal. Left ventricular   ejection fraction, by visual estimation, is 50 to 55%.  Right Ventricle: Normal right ventricular size, wall thickness, and   systolic function. The right ventricular size is normal. Global RV   systolic function is normal.  Left Atrium: The left atrium is mildly dilated.  Right Atrium: The right atrium is normal in size.  Pericardium: There is no evidence of pericardial effusion.  Mitral Valve: The mitral valve is normal in structure. Mild mitral valve   regurgitation is seen.  Tricuspid Valve: The tricuspid valve is normal. Mild tricuspid   regurgitation is visualized. The tricuspid regurgitant velocity is 2.64     m/s, and with an assumed right atrial pressure of 5 mmHg, the estimated   right ventricular  systolic pressure is normal at 32.9 mmHg.  Aortic Valve: Mild to moderate aortic valve regurgitation is seen.  Pulmonic Valve: The pulmonic valve is normal. Mild pulmonic valve   regurgitation.  Aorta: There is mild to moderate dilatation of the aortic root and   ascending aorta.    [id number] Ida Rogue MD  Electronically signed by [id number] Ida Rogue MD  Signature Date/Time: 06/22/2014/2:04:29 PM    *** Final ***    IMPRESSION: .    Verified By: Minna Merritts, M.D., MD   Assessment/Plan:  Assessment/Plan:  Assessment IMP  unstable angina  non STEMI  elevated troponin  atrial fibrillation  hypertension  coronary artery disease  congestive heart failure systolic dysfunction  cardiomyopathy .   Plan amiodarone switch from IV to p.o. 200 mg a day  discontinue digoxin  consider low-dose Coreg for heart failure and  blood pressure control  low-dose lisinopril therapy for heart failure therapy blood pressure   Imdur 30 mg a day for blood pressure can tolerate  anticoagulation long-term because of AFib  low-dose Lasix as necessary if there is any shortness of breath  outpatient follow-up with Cardiology   Electronic Signatures: Lujean Amel D (MD)  (Signed 18-Mar-16 11:01)  Authored: Chief Complaint, VITAL SIGNS/ANCILLARY NOTES, Brief Assessment, Lab Results, Radiology Results, Assessment/Plan   Last Updated: 18-Mar-16 11:01 by Yolonda Kida (MD)

## 2014-08-06 NOTE — Discharge Summary (Signed)
PATIENT NAME:  Joe Kennedy, Joe Kennedy MR#:  [id number] DATE OF BIRTH:  1940-09-29  DATE OF ADMISSION:  07/04/2014 DATE OF DISCHARGE:  07/06/2014  ADMITTING PHYSICIAN: Gladstone Lighter, MD  PRIMARY CARE PHYSICIAN: Otilio Miu, MD  PRIMARY CARDIOLOGIST: Lujean Amel, MD  [place]: Cardiology by Dr. Clayborn Bigness.  TRANSFER ACCEPTING FACILITY: [facility].   ACCEPTING PHYSICIAN: Dr. Wadie Lessen  FINAL DIAGNOSES: 1.  Non-ST segment elevation myocardial infarction with recent cardiac catheterization showing 2 vessel disease and failed medical management.  2.  Coronary artery disease.  3.  Acute respiratory failure.  4.  Right lower lobe pneumonia.  5.  Positive blood cultures with gram-positive cocci from the 30th of March 2016. Final sensitivities pending.   6.  Chronic atrial fibrillation, on Eliquis, currently on hold.   PROCEDURES: Cardiac catheterization revealing apical akinesia with ejection fraction of 35%. Moderately depressed global LV systolic function.   DISCHARGE HOME MEDICATIONS:  1.  Metoprolol 25 mg p.o. b.i.d.  2.  Aspirin 81 mg p.o. daily.  3.  Amiodarone 200 mg p.o. daily.  4.  Atorvastatin 40 mg p.o. daily.  5.  Digoxin 125 mcg p.o. daily.  6.  Tramadol 50 mg 3 times a day p.r.n. for pain.  7.  Nitroglycerin ointment 1 inch topically twice a day.  8.  Tylenol 650 mg q. 4 hours p.r.n. for pain or fever.  9.  Levaquin 750 mg p.o. daily for 5 more days.  10.  Vancomycin 1 gram IV q. 18 hours, started on July 06, 2014 as blood cultures are positive.   DISCHARGE HOME OXYGEN: None.   DISCHARGE DIET: Low-sodium diet.   DISCHARGE ACTIVITY: As tolerated.   FOLLOWUP INSTRUCTIONS: The patient will be transferred to [facility] based on bed availability for possible bypass.   LABS AND IMAGING STUDIES PRIOR TO DISCHARGE: WBC 14.5, hemoglobin 9.3, hematocrit 30.1, platelet count 269,000.   Sodium 136, potassium 3.9, chloride 110, bicarb 23, BUN 30, creatinine 1.65,  glucose 103, calcium 7.9. Magnesium 2.3.   One set of blood cultures from July 05, 2014 growing gram-positive cocci.   Urinalysis negative for any infection.   Chest x-ray on July 05, 2014 showing new right base infiltrate.   Troponins went as high as 11.49 this admission.  BRIEF HOSPITAL COURSE: Joe Kennedy is a 74 year old pleasant Caucasian male with history of CAD, A-fib, and hypertension who was in the hospital from June 22, 2014 and got discharged on June 23, 2014 for NSTEMI, had a cardiac catheterization at the time which showed 2 vessel disease, but not amenable for any intervention so Imdur was added to his home meds and the patient was discharged home with medical management. Ten days later the patient goes for followup with his primary care physician and has a syncopal episode with EKG changes and brought back to the hospital.  1.  NSTEMI. Initially admitted for syncope however ruled in with troponin as high as 11.49. He was started on heparin drip. His Eliquis has been on hold since July 05, 2014. The last dose of Eliquis he received was on July 04, 2014. Seen by cardiologist, Dr. Clayborn Bigness. He is on aspirin, metoprolol and statin. His cath about 10 days ago, prior to this admission, did show apical akinesis, EF of 35% , moderately reduced LV ejection fraction. However, his lisinopril is being held due to hypotension and mild acute renal insufficiency on admission. His Imdur is on hold as he is on nitroglycerin paste at this time. He will probably  need bypass surgery, but that will be evaluated at [facility] after possible repeat cath. Dr. Wadie Lessen from [facility] cardiology has kindly accepted the patient and Joe Kennedy will be transferred to [facility] today.  2.  Known history of chronic A-fib. Was on Eliquis as an outpatient, held from July 04, 2014. Went into RVR in the hospital secondary to a respiratory episode. He was on Cardizem drip. That has been discontinued. Heart rate is better controlled now,  and he is on metoprolol. He is also on amiodarone and digoxin.  3.  Acute respiratory failure, was hypoxic requiring BiPAP and nasal cannula. Currently on room air and more stable now. X-ray only showed pneumonia. Blood cultures are growing gram-positive cocci. He is on vancomycin for now and Levaquin and final sensitivities are pending. Continue to monitor and clinically improving.   His course has been otherwise uneventful in the hospital.   DISCHARGE CONDITION: Stable.   DISCHARGE DISPOSITION: To [place] ON DISCHARGE: 45 minutes.   ____________________________ Gladstone Lighter, MD rk:sb D: 07/06/2014 13:51:13 ET T: 07/06/2014 14:05:10 ET JOB#: [id number]  cc: Gladstone Lighter, MD, <Dictator> Gladstone Lighter MD ELECTRONICALLY SIGNED 07/13/2014 12:25

## 2014-08-06 NOTE — H&P (Signed)
PATIENT NAME:  Joe Kennedy, Joe Kennedy MR#:  [id number] DATE OF BIRTH:  04-11-1940  DATE OF ADMISSION:  06/22/2014  CHIEF COMPLAINT:  Chest pain.   HISTORY OF PRESENT ILLNESS:  This is a 74 year old male who presented tonight to the ED after a second episode of chest pain. He describes the pain as substernally located. He describes it as a tight and also sharp-type pain. He describes it as radiating down both arms and down into both hands. He denies any radiation to his jaw or back. He endorses some associated diaphoresis and some associated nausea with no vomiting, dizziness, blurred vision, or abdominal pain. He states that he had this pain one time before last week. He ignored it and it kind of went away after a short period of time. The pain occurred tonight at rest when he was lying down to go to sleep. He states that the pain was worse this time than it was the time before, and he took full-strength aspirin. Shortly after that the pain subsided, but he had already called EMS, and so he came in to the ED for evaluation. Here, in the ED he was found to have no EKG changes, but troponin positive at 0.13, so at this point, the hospitalist service was called for admission for NSTEMI.   PRIMARY CARE PHYSICIAN:  Juline Patch, MD   PAST MEDICAL HISTORY:  Hypertension, atrial fibrillation, osteoarthritis.  CURRENT MEDICATIONS:  Tramadol 50 mg t.i.d. p.r.n., Lopressor 25 mg b.i.d., etodolac 500 mg b.i.d., digoxin 125 mcg daily, niacin 500 mg daily.   PAST SURGICAL HISTORY:  The patient denies any prior surgeries.   ALLERGIES:  PENICILLIN, WHICH CAUSES ANAPHYLAXIS.   FAMILY HISTORY:  Includes only coronary artery disease.   SOCIAL HISTORY:  The patient was never a smoker. Occasional social drinker. He denies illicit drug use.   REVIEW OF SYSTEMS: CONSTITUTIONAL:  Denies fever, fatigue, or weakness.  EYES:  Denies blurred or double vision, pain, or redness.  EAR, NOSE, AND THROAT:  Denies ear pain,  hearing loss, or difficulty swallowing.  RESPIRATORY:  Denies cough, wheeze, or dyspnea.  CARDIOVASCULAR:  Endorses chest pain. Denies orthopnea, edema, or palpitations.  GASTROINTESTINAL:  Endorses some nausea. Denies vomiting or diarrhea. Denies abdominal pain. Denies constipation.  GENITOURINARY:  Denies dysuria, hematuria, or frequency.  ENDOCRINE:  Denies nocturia, thyroid problems, or heat or cold intolerance.  HEMATOLOGIC AND LYMPHATIC:  Denies easy bruising or bleeding or swollen glands.  INTEGUMENTARY:  Denies acne, rash, or lesion.  MUSCULOSKELETAL:  Endorses osteoarthritis in both knees, specifically, the right knee. Denies acute joint swelling or gout.  NEUROLOGIC:  Denies numbness, weakness, or headache.  PSYCHIATRIC:  Denies anxiety, insomnia, or depression.   PHYSICAL EXAMINATION: VITAL SIGNS:  Blood pressure 159/79, pulse 93, temperature 100.9, and respirations 20 with 93% oxygen saturations on room air.  GENERAL:  This is a well-nourished male in no acute distress, lying in bed.  HEENT:  Pupils are equal, round, and reactive to light and accommodation. Extraocular movements are intact. No scleral icterus. Moist mucosal membranes.  NECK:  Thyroid is not enlarged. Neck is supple. No masses. Nontender. No cervical adenopathy. No JVD noted.  RESPIRATORY:  Clear to auscultation bilaterally with no rales, rhonchi, or wheezes. Good breath sounds throughout. No respiratory distress.  CARDIOVASCULAR:  Irregular heart rate. No murmurs, rubs, or gallops noted on auscultation. Good pedal pulses with no lower extremity edema.  ABDOMEN:  Soft, nontender, nondistended with good bowel sounds. No hepatosplenomegaly.  MUSCULOSKELETAL:  He has 5/5 muscular strength in all 4 extremities. Full range of motion spontaneously throughout. No cyanosis or clubbing.  SKIN:  No rash or lesions. Skin is warm, dry, and intact.  LYMPHATIC:  No adenopathy.  NEUROLOGIC:  Cranial nerves are intact. Sensation is  intact throughout. No dysarthria or aphasia.  PSYCHIATRIC:  Alert and oriented x 3. Cooperative with good insight and judgment.   LABORATORY DATA:  White count is 11.6, hemoglobin 9.9, hematocrit 32, platelet count 334,000. Sodium is 137, potassium 4.3, chloride 104, CO2 of 24, BUN 18, creatinine 1.14, glucose 128. Troponin is 0.13.   RADIOLOGIC DATA:  Chest x-ray showed cardiomegaly and mild vascular or interstitial prominence. This may represent mild congestive heart failure.   ASSESSMENT AND PLAN: 1.  Non-ST segment elevation myocardial infarction. The patient has positive troponins. We will continue to trend these troponins here. His history is typical symptoms for cardiac disease. We will get a cardiology consult. We have started him on full-dose Lovenox. We will get an echocardiogram and follow up with cardiology recommendations from there.  2.  Chronic atrial fibrillation. The patient is currently rate controlled on his home medicines of digoxin and Lopressor. We will continue his medications here.  3.  Hypertension: The patient's blood pressure is mildly elevated. We will continue his home blood pressure medications, and we will add IV p.r.n. medications as needed to keep his blood pressure controlled.  4.  Acute systolic congestive heart failure. We will order a BNP and echocardiogram as above to evaluate this. Finding was originally noted on chest x-ray.  5.  Osteoarthritis - stable, both knees, monitor condition and provide pain control as needed 6.  Deep vein thrombosis prophylaxis. This patient is on full-dose anticoagulation.   CODE STATUS:  This patient is a full code.   TIME SPENT ON THIS ADMISSION:  45 minutes.    ____________________________ Wilford Corner. Jannifer Franklin, MD dfw:[id number] D: 06/22/2014 04:58:41 ET T: 06/22/2014 05:21:12 ET JOB#: [id number]  cc: Wilford Corner. Jannifer Franklin, MD, <Dictator> Nora Rooke Fawn Kirk MD ELECTRONICALLY SIGNED 06/22/2014 13:30

## 2014-08-06 NOTE — H&P (Signed)
PATIENT NAME:  Joe Kennedy, Joe Kennedy MR#:  [id number] DATE OF BIRTH:  January 26, 1941  DATE OF ADMISSION:  07/04/2014  ADMITTING PHYSICIAN: Gladstone Lighter, MD  PRIMARY CARE PHYSICIAN: Juline Patch, MD  PRIMARY CARDIOLOGIST: Dwayne D. Callwood, MD   CHIEF COMPLAINT: Syncope.   HISTORY OF PRESENT ILLNESS: Joe Kennedy is a very pleasant 74 year old Caucasian male past medical history significant for history of atrial fibrillation, recently started on Eliquis; coronary artery disease status post cardiac catheterization last week showing 2-vessel disease, medical management recommended; systolic heart failure, apical akinesis with EF of 35% noted on the catheterization; hypertension; right knee osteoarthritis, presents to the hospital secondary to a syncopal episode. The patient, as mentioned above, was in the hospital about 10 days ago for angina, NSTEMI, had a cardiac catheterization, which showed 2-vessel disease, not amenable for intervention, and so he was discharged on medical management. The patient also has a history of atrial fibrillation and was started on Eliquis at the same time. He denies any nausea, vomiting, or bleeding anywhere. No hematemesis or melena. He states his appetite has been extremely poor with poor APO eating and also drinking fluids. He went to his PCP office for post hospital visit followup and was noted to be hypotensive and passed out in the office. He has been feeling lightheaded over the last few days. The patient denies any chest pain at this time.   PAST MEDICAL HISTORY:  1.  Chronic anemia.  2.  Atrial fibrillation, rate controlled on Eliquis.  3.  CAD with angina pectoris, recently cardiac catheterization.  4.  Systolic heart failure, apical akinesis, EF noted to be 35%.  5.  Hypertension.  6.  Osteoarthritis of right knee.  7.  History of dermatitis.  8.  Hyperlipidemia.   PAST SURGICAL HISTORY: None.   ALLERGIES TO MEDICATIONS: PENICILLIN AND STATINS.   CURRENT  HOME MEDICATION:  1.  Amiodarone 200 mg p.o. daily.  2.  Atorvastatin 40 mg p.o. daily.  3.  Digoxin 125 mcg p.o. daily.  4.  Eliquis 5 mg p.o. b.i.d.  5.  Etodolac 500 mg p.o. b.i.d.  6.  Imdur 30 mg p.o. daily.  7.  Lisinopril 10 mg p.o. daily.  8.  Metoprolol 25 mg p.o. b.i.d.  9.  Sublingual nitroglycerin p.r.n. for chest pain.  10.  Tramadol 50 mg p.o. t.i.d. p.r.n. for pain.   SOCIAL HISTORY: Lives at home with his wife. No smoking or alcohol use. He has significant right knee pain from arthritis and using crutches with minimal weightbearing. He is following with Dr. Marry Guan for the same.  FAMILY HISTORY: Significant for heart problems and diabetes in the family.    REVIEW OF SYSTEMS:  CONSTITUTIONAL: Positive for fatigue, weakness. No fevers.  EYES: No blurred vision, double vision, inflammation, or glaucoma.  EAR, NOSE, AND THROAT: No tinnitus, ear pain, hearing loss, epistaxis, or discharge.  RESPIRATORY: No cough, wheeze, hemoptysis, or COPD.  CARDIOVASCULAR: No chest pain, orthopnea, edema, arrhythmia, palpitations. Positive for syncope.  GASTROINTESTINAL: No nausea, vomiting, diarrhea, abdominal pain, hematemesis, or melena.  GENITOURINARY: No hematuria, renal calculus, frequency, or incontinence.  ENDOCRINE: No polyuria, nocturia, thyroid problems, heat or cold intolerance.  HEMATOLOGY: No anemia, easy bruising or bleeding.  SKIN: No acne, rash, or lesions.  MUSCULOSKELETAL: No neck, back, shoulder pain. Positive for knee pain. No gout. Positive for arthritis.  NEUROLOGICAL: No numbness, weakness, CVA, TIA, or seizures.  PSYCHOLOGICAL: No anxiety, insomnia, or depression.   PHYSICAL EXAMINATION:  VITAL SIGNS: Temperature  98 degrees Fahrenheit, pulse 78, respirations 20, blood pressure 116/33, pulse oximetry 98% on room air.  GENERAL: A well-built, well-nourished male, pale-appearing, not in any acute distress.  HEENT: Normocephalic, atraumatic. Pupils equal, round,  reacting to light. Anicteric sclerae. Extraocular movements intact. Oropharynx clear without erythema, mass, or exudates.  NECK: Supple. No thyromegaly, JVD, or carotid bruits. No lymphadenopathy.  LUNGS: Moving air bilaterally. No wheeze or crackles. No use of accessory muscles for breathing.  CARDIOVASCULAR: S1, S2, regular rate and rhythm. A 3/6 systolic murmur heard. No rubs or gallops.  ABDOMEN: Soft, nontender, nondistended. No hepatosplenomegaly. Normal bowel sounds.  EXTREMITIES: No pedal edema. No clubbing or cyanosis. Dorsalis pedis pulses 2+ palpable bilaterally.  LYMPHATICS: No cervical lymphadenopathy.  NEUROLOGIC: Cranial nerves intact. No focal motor or sensory deficits.  PSYCHOLOGICAL: The patient is awake, alert, oriented x 3.   LABORATORY DATA:  1.  WBC is 9.3, hemoglobin 8.9, hematocrit 28.2, platelet count 261,000.  2.  Sodium 135, potassium 3.9, chloride 101, bicarbonate 26, BUN 21, creatinine 1.54, glucose 113, and calcium of 8.1.  3.  ALT is 29, AST 35, alkaline phosphatase is 71, total bilirubin 0.5, and albumin of 2.6, magnesium 2.1.  4.  INR is 1.3.  5.  Troponin 0.08.  6.  Chest x-ray showing no acute cardiopulmonary abnormality.  7.  EKG showing normal sinus rhythm, heart rate of 73, ST depressions in lead II, which have been there in old EKG from last admission; deep T wave inversions in the lateral leads, which are also old from previous EKG.   ASSESSMENT AND PLAN: A 74 year old male with history of atrial fibrillation on Eliquis, anemia, coronary artery disease on medical management, had a syncopal episode at primary care physician's office today and brought in.  1.  Syncope. Could be from hypotension, poor appetite, p.o. intake also has been decreased lately. Scheduled for esophagogastroduodenoscopy and colonoscopy for weight loss and poor appetite, we will continue to follow that. Intravenous fluids. Orthostatics will be checked. Monitor on telemetry. Recent  echocardiogram and cardiac catheterization have been discrepancies in ejection fraction, ejection fraction noted to be 35% on left ventriculogram. Not on Lasix, so we will not start any now. We will get carotid Dopplers and cardiology as an consulted.  2.  Acute renal failure, likely prerenal. Continue intravenous fluids. Hold medications that control the blood pressure at this time. Also, not on any Lasix.  3.  Coronary artery disease with recent angina. Cardiac catheterization with diffuse diagonal 1 and left circumflex disease. Medical management recommended; however, medications on hold due to hypotension today. Echocardiogram with normal ejection fraction but catheterization with apical akinesis, ejection fraction of 30%. Again, as mentioned above, not on Lasix. Continue to monitor.  4.  Atrial fibrillation. Hold metoprolol due to hypotension. Continue digoxin and amiodarone. On Eliquis, we will continue. Monitor hemoglobin.  5.  Hypotension. Hold medications.  6.  Anemia, chronic. Monitor while on Eliquis.   7.  Deep vein thrombosis prophylaxis on Eliquis.   CODE STATUS: Full code.   TOTAL TIME SPENT ON ADMISSION: 50 minutes.    ____________________________ Gladstone Lighter, MD rk:bm D: 07/04/2014 20:27:30 ET T: 07/04/2014 23:26:24 ET JOB#: [id number]  cc: Gladstone Lighter, MD, <Dictator> Dwayne D. Clayborn Bigness, MD Juline Patch, MD Gladstone Lighter MD ELECTRONICALLY SIGNED 07/13/2014 12:25

## 2014-08-06 NOTE — Discharge Summary (Signed)
PATIENT NAME:  Joe Kennedy, ALEXA MR#:  [id number] DATE OF BIRTH:  1940-06-28  DATE OF ADMISSION:  06/22/2014 DATE OF DISCHARGE:  06/23/2014  CONSULTATIONS: Dwayne D. Callwood, MD   FINAL DIAGNOSES: 1.  Non-ST elevation myocardial infarction.  2.  Chronic atrial fibrillation.  3.  Hypertension.  4.  Anemia.  5.  Coronary artery disease.   CONDITION: Stable.   CODE STATUS: Full code.   HOME MEDICATIONS: Please refer to the medication reconciliation list.   DIET: Low-sodium, low-fat, low-cholesterol diet.   ACTIVITY: As tolerated.   FOLLOWUP CARE: Follow with PCP and Dr. Clayborn Bigness within 1 to 2 weeks.   REASON FOR ADMISSION: Chest pain.   HOSPITAL COURSE: The patient is a 74 year old Caucasian male with a history of hypertension and atrial fibrillation, came to the ED due to chest pain. The patient has no EKG changes, but was found to have an elevated troponin at 0.13. For detailed history and physical examination, please refer to the admission note dictated by Dr. Jannifer Franklin. The patient's chest x-ray showed cardiomegaly with mild vascular or interstitial prominence. Laboratory data was unremarkable except a troponin 0.13. The patient was admitted for non-STEMI. The patient has no chest pain after admission.  He has been treated with Lovenox 1 mg per kg q. 12 hours. In addition, the patient has been treated with Lipitor. Dr. Clayborn Bigness did a cardiac catheterization last night, report is pending. Dr. Clayborn Bigness suggested the patient needed to start anticoagulation for both non-STEMI and atrial fibrillation. In addition, he suggested to add Imdur, continue Lipitor, lisinopril, and Lopressor. The patient's echocardiogram showed a normal ejection fraction.   For chronic atrial fibrillation, the patient's heart rate is controlled. Dr. Clayborn Bigness suggested to start amiodarone drip after cardiac cath.  In addition, he suggested to change amiodarone to 200 mg daily after discharge and discontinue digoxin.  Since Dr. Clayborn Bigness suggested anticoagulation therapy, I discussed with the patient and the patient's wife about significant side effects of Eliquis. The patient agrees to take Eliquis. I advised them to watch bleeding.   Hypertension is controlled with lisinopril and Lopressor.   Anemia, unclear etiology. The patient's hemoglobins was 9.9 and today is 9.1. Advised the patient to follow up with PCP for anemia workup.   The patient has no complaints. His vital signs are stable. His physical examination is unremarkable. He is clinically stable and will be discharged to home today. I discussed patient's discharge plan with the patient, the patient's wife, nurse, case manager, and Dr. Clayborn Bigness.   TIME SPENT: About 42 minutes.    ____________________________ Demetrios Loll, MD qc:at D: 06/23/2014 14:16:27 ET T: 06/23/2014 15:28:51 ET JOB#: [id number]  cc: Demetrios Loll, MD, <Dictator> Demetrios Loll MD ELECTRONICALLY SIGNED 06/23/2014 16:13

## 2014-08-08 ENCOUNTER — Telehealth: Payer: Self-pay | Admitting: Licensed Clinical Social Worker

## 2014-08-08 NOTE — Telephone Encounter (Signed)
Vaughan Basta RN from [facility] called for the ok to pull patient's picc after completion of antibiotics. Ok to end antibiotics since 4 weeks is up and picc can be pulled. Left a message on the RN's voicemail.

## 2014-08-17 ENCOUNTER — Other Ambulatory Visit (HOSPITAL_COMMUNITY): Payer: Self-pay

## 2014-08-17 ENCOUNTER — Ambulatory Visit (HOSPITAL_COMMUNITY)
Admission: RE | Admit: 2014-08-17 | Discharge: 2014-08-17 | Disposition: A | Discharge status: Home / Self Care | Payer: Commercial Managed Care - HMO | Source: Ambulatory Visit | Attending: Surgery | Admitting: Surgery

## 2014-08-17 DIAGNOSIS — I272 Other secondary pulmonary hypertension: Secondary | ICD-10-CM | POA: Insufficient documentation

## 2014-08-17 DIAGNOSIS — I358 Other nonrheumatic aortic valve disorders: Secondary | ICD-10-CM | POA: Diagnosis not present

## 2014-08-17 DIAGNOSIS — J90 Pleural effusion, not elsewhere classified: Secondary | ICD-10-CM | POA: Diagnosis not present

## 2014-08-17 DIAGNOSIS — I252 Old myocardial infarction: Secondary | ICD-10-CM | POA: Insufficient documentation

## 2014-08-17 DIAGNOSIS — I10 Essential (primary) hypertension: Secondary | ICD-10-CM | POA: Diagnosis not present

## 2014-08-17 DIAGNOSIS — I339 Acute and subacute endocarditis, unspecified: Secondary | ICD-10-CM

## 2014-08-17 DIAGNOSIS — I509 Heart failure, unspecified: Secondary | ICD-10-CM | POA: Diagnosis not present

## 2014-08-17 DIAGNOSIS — I2510 Atherosclerotic heart disease of native coronary artery without angina pectoris: Secondary | ICD-10-CM | POA: Diagnosis not present

## 2014-08-17 DIAGNOSIS — I4891 Unspecified atrial fibrillation: Secondary | ICD-10-CM | POA: Diagnosis not present

## 2014-08-17 DIAGNOSIS — I39 Endocarditis and heart valve disorders in diseases classified elsewhere: Secondary | ICD-10-CM

## 2014-08-17 NOTE — Progress Notes (Signed)
*  PRELIMINARY RESULTS* Echocardiogram 2D Echocardiogram has been performed.  Leavy Cella 08/17/2014, 12:01 PM

## 2014-08-20 ENCOUNTER — Encounter: Payer: Self-pay | Admitting: *Deleted

## 2014-08-20 ENCOUNTER — Emergency Department: Payer: Commercial Managed Care - HMO

## 2014-08-20 ENCOUNTER — Inpatient Hospital Stay
Admission: EM | Admit: 2014-08-20 | Discharge: 2014-08-21 | DRG: 280 | Disposition: A | Discharge status: Home / Self Care | Payer: Commercial Managed Care - HMO | Attending: Internal Medicine | Admitting: Internal Medicine

## 2014-08-20 DIAGNOSIS — I4891 Unspecified atrial fibrillation: Secondary | ICD-10-CM | POA: Diagnosis present

## 2014-08-20 DIAGNOSIS — I5043 Acute on chronic combined systolic (congestive) and diastolic (congestive) heart failure: Secondary | ICD-10-CM | POA: Diagnosis present

## 2014-08-20 DIAGNOSIS — B9689 Other specified bacterial agents as the cause of diseases classified elsewhere: Secondary | ICD-10-CM | POA: Diagnosis present

## 2014-08-20 DIAGNOSIS — I10 Essential (primary) hypertension: Secondary | ICD-10-CM | POA: Diagnosis present

## 2014-08-20 DIAGNOSIS — R011 Cardiac murmur, unspecified: Secondary | ICD-10-CM | POA: Diagnosis present

## 2014-08-20 DIAGNOSIS — M13869 Other specified arthritis, unspecified knee: Secondary | ICD-10-CM | POA: Diagnosis present

## 2014-08-20 DIAGNOSIS — Z79899 Other long term (current) drug therapy: Secondary | ICD-10-CM | POA: Diagnosis not present

## 2014-08-20 DIAGNOSIS — I350 Nonrheumatic aortic (valve) stenosis: Secondary | ICD-10-CM | POA: Diagnosis not present

## 2014-08-20 DIAGNOSIS — Z9889 Other specified postprocedural states: Secondary | ICD-10-CM | POA: Diagnosis not present

## 2014-08-20 DIAGNOSIS — I480 Paroxysmal atrial fibrillation: Secondary | ICD-10-CM

## 2014-08-20 DIAGNOSIS — I509 Heart failure, unspecified: Secondary | ICD-10-CM

## 2014-08-20 DIAGNOSIS — Z888 Allergy status to other drugs, medicaments and biological substances status: Secondary | ICD-10-CM

## 2014-08-20 DIAGNOSIS — K219 Gastro-esophageal reflux disease without esophagitis: Secondary | ICD-10-CM | POA: Diagnosis present

## 2014-08-20 DIAGNOSIS — R0600 Dyspnea, unspecified: Secondary | ICD-10-CM

## 2014-08-20 DIAGNOSIS — I351 Nonrheumatic aortic (valve) insufficiency: Secondary | ICD-10-CM | POA: Diagnosis present

## 2014-08-20 DIAGNOSIS — I272 Other secondary pulmonary hypertension: Secondary | ICD-10-CM | POA: Diagnosis present

## 2014-08-20 DIAGNOSIS — Z8249 Family history of ischemic heart disease and other diseases of the circulatory system: Secondary | ICD-10-CM | POA: Diagnosis not present

## 2014-08-20 DIAGNOSIS — J9811 Atelectasis: Secondary | ICD-10-CM | POA: Diagnosis present

## 2014-08-20 DIAGNOSIS — I252 Old myocardial infarction: Secondary | ICD-10-CM | POA: Diagnosis not present

## 2014-08-20 DIAGNOSIS — Z8701 Personal history of pneumonia (recurrent): Secondary | ICD-10-CM

## 2014-08-20 DIAGNOSIS — I2510 Atherosclerotic heart disease of native coronary artery without angina pectoris: Secondary | ICD-10-CM

## 2014-08-20 DIAGNOSIS — R0789 Other chest pain: Secondary | ICD-10-CM | POA: Diagnosis present

## 2014-08-20 DIAGNOSIS — Z880 Allergy status to penicillin: Secondary | ICD-10-CM | POA: Diagnosis not present

## 2014-08-20 DIAGNOSIS — I214 Non-ST elevation (NSTEMI) myocardial infarction: Secondary | ICD-10-CM | POA: Diagnosis present

## 2014-08-20 DIAGNOSIS — I38 Endocarditis, valve unspecified: Secondary | ICD-10-CM | POA: Diagnosis present

## 2014-08-20 LAB — BASIC METABOLIC PANEL
Anion gap: 9 (ref 5–15)
BUN: 24 mg/dL — ABNORMAL HIGH (ref 6–20)
CO2: 23 mmol/L (ref 22–32)
Calcium: 8.9 mg/dL (ref 8.9–10.3)
Chloride: 105 mmol/L (ref 101–111)
Creatinine, Ser: 1.57 mg/dL — ABNORMAL HIGH (ref 0.61–1.24)
GFR calc Af Amer: 48 mL/min — ABNORMAL LOW (ref >60)
GFR calc non Af Amer: 41 mL/min — ABNORMAL LOW (ref >60)
GLUCOSE: 125 mg/dL — AB (ref 65–99)
POTASSIUM: 4.3 mmol/L (ref 3.5–5.1)
Sodium: 137 mmol/L (ref 135–145)

## 2014-08-20 LAB — CBC
HEMATOCRIT: 40.1 % (ref 40.0–52.0)
Hemoglobin: 12.7 g/dL — ABNORMAL LOW (ref 13.0–18.0)
MCH: 26.3 pg (ref 26.0–34.0)
MCHC: 31.6 g/dL — ABNORMAL LOW (ref 32.0–36.0)
MCV: 83.2 fL (ref 80.0–100.0)
Platelets: 266 10*3/uL (ref 150–440)
RBC: 4.82 MIL/uL (ref 4.40–5.90)
RDW: 20.2 % — ABNORMAL HIGH (ref 11.5–14.5)
WBC: 11.8 10*3/uL — ABNORMAL HIGH (ref 3.8–10.6)

## 2014-08-20 LAB — TROPONIN I
TROPONIN I: 0.07 ng/mL — AB (ref <0.031)
Troponin I: 0.08 ng/mL — ABNORMAL HIGH (ref <0.031)

## 2014-08-20 LAB — BRAIN NATRIURETIC PEPTIDE: B Natriuretic Peptide: 1769 pg/mL — ABNORMAL HIGH (ref 0.0–100.0)

## 2014-08-20 MED ORDER — ACETAMINOPHEN 325 MG PO TABS: 650.0000 mg | ORAL_TABLET | ORAL | Status: DC | PRN

## 2014-08-20 MED ORDER — ENOXAPARIN SODIUM 80 MG/0.8ML ~~LOC~~ SOLN
1.0000 mg/kg | Freq: Once | SUBCUTANEOUS | Status: DC
  Filled 2014-08-20: qty 0.8

## 2014-08-20 MED ORDER — ASPIRIN 81 MG PO CHEW
CHEWABLE_TABLET | ORAL | Status: AC
  Administered 2014-08-20: 324 mg via ORAL
  Filled 2014-08-20: qty 4

## 2014-08-20 MED ORDER — ENSURE ENLIVE PO LIQD
237.0000 mL | Freq: Two times a day (BID) | ORAL | Status: DC
  Administered 2014-08-20 (×2): 237 mL via ORAL

## 2014-08-20 MED ORDER — FUROSEMIDE 10 MG/ML IJ SOLN
40.0000 mg | Freq: Once | INTRAMUSCULAR | Status: AC
  Administered 2014-08-20: 40 mg via INTRAVENOUS

## 2014-08-20 MED ORDER — ASPIRIN EC 81 MG PO TBEC
81.0000 mg | DELAYED_RELEASE_TABLET | Freq: Every day | ORAL | Status: DC
  Administered 2014-08-21: 81 mg via ORAL
  Filled 2014-08-20: qty 1

## 2014-08-20 MED ORDER — TRAMADOL HCL 50 MG PO TABS: 50.0000 mg | ORAL_TABLET | Freq: Three times a day (TID) | ORAL | Status: DC | PRN

## 2014-08-20 MED ORDER — NITROGLYCERIN 0.4 MG SL SUBL: 0.4000 mg | SUBLINGUAL_TABLET | SUBLINGUAL | Status: DC | PRN

## 2014-08-20 MED ORDER — ONDANSETRON HCL 4 MG/2ML IJ SOLN: 4.0000 mg | Freq: Four times a day (QID) | INTRAMUSCULAR | Status: DC | PRN

## 2014-08-20 MED ORDER — FUROSEMIDE 40 MG PO TABS: 60.0000 mg | ORAL_TABLET | Freq: Every day | ORAL | Status: DC

## 2014-08-20 MED ORDER — METOPROLOL TARTRATE 50 MG PO TABS
50.0000 mg | ORAL_TABLET | Freq: Two times a day (BID) | ORAL | Status: DC
  Administered 2014-08-20 – 2014-08-21 (×2): 50 mg via ORAL
  Filled 2014-08-20 (×3): qty 1

## 2014-08-20 MED ORDER — PANTOPRAZOLE SODIUM 40 MG PO TBEC
40.0000 mg | DELAYED_RELEASE_TABLET | Freq: Every day | ORAL | Status: DC
  Administered 2014-08-20 – 2014-08-21 (×2): 40 mg via ORAL
  Filled 2014-08-20 (×2): qty 1

## 2014-08-20 MED ORDER — AMIODARONE HCL 200 MG PO TABS
200.0000 mg | ORAL_TABLET | Freq: Every day | ORAL | Status: DC
  Administered 2014-08-20 – 2014-08-21 (×2): 200 mg via ORAL
  Filled 2014-08-20 (×2): qty 1

## 2014-08-20 MED ORDER — POTASSIUM CHLORIDE CRYS ER 20 MEQ PO TBCR
40.0000 meq | EXTENDED_RELEASE_TABLET | Freq: Every day | ORAL | Status: DC
  Administered 2014-08-20 – 2014-08-21 (×2): 40 meq via ORAL
  Filled 2014-08-20 (×2): qty 2

## 2014-08-20 MED ORDER — ASPIRIN 81 MG PO CHEW
324.0000 mg | CHEWABLE_TABLET | Freq: Once | ORAL | Status: AC
  Administered 2014-08-20: 324 mg via ORAL

## 2014-08-20 MED ORDER — ENOXAPARIN SODIUM 80 MG/0.8ML ~~LOC~~ SOLN
1.0000 mg/kg | Freq: Two times a day (BID) | SUBCUTANEOUS | Status: DC
  Administered 2014-08-21: 80 mg via SUBCUTANEOUS
  Filled 2014-08-20: qty 0.8

## 2014-08-20 MED ORDER — DIGOXIN 125 MCG PO TABS
125.0000 ug | ORAL_TABLET | Freq: Every day | ORAL | Status: DC
  Administered 2014-08-20: 125 ug via ORAL
  Filled 2014-08-20: qty 1

## 2014-08-20 MED ORDER — FUROSEMIDE 10 MG/ML IJ SOLN
INTRAMUSCULAR | Status: AC
  Administered 2014-08-20: 40 mg via INTRAVENOUS
  Filled 2014-08-20: qty 4

## 2014-08-20 NOTE — ED Notes (Signed)
Patient resting in stretcher. Respirations even and unlabored. No obvious distress. Cardiac monitor in place. No needs/concerns verbalized at this time. Family at bedside. Call bell within reach. Encouraged to call with needs. Will continue to monitor.

## 2014-08-20 NOTE — Progress Notes (Signed)
Pt's BP 128/24 HR 62-64, Metoprolol 50 mg schedule. Notified Dr Jannifer Franklin on the phone. Order received to hold Med. Dr to put in parameters on BP med.

## 2014-08-20 NOTE — ED Notes (Signed)
Pt presents w/ c/o dyspnea x 2 days and chest pain starting last night at 2100. Pt reports worsening pain at 0130. Pt took NTG 0.4 MG SL X 1 with some relief. Pt c/o diaphoresis prior to arrival. Pt is pale and dyspneic at this time.

## 2014-08-20 NOTE — ED Notes (Signed)
Patient states he is unsure about taking aspirin and lovenox, because he takes eliquis at home. Discussed with ED MD. Verbal order to d/c lovenox but continue with asa. Patient agreeable.

## 2014-08-20 NOTE — Consult Note (Signed)
CARDIOLOGY CONSULT NOTE  Patient ID: Joe Kennedy, MRN: [id number], DOB/AGE: 75-Feb-1942 75 y.o. Admit date: 08/20/2014 Date of Consult: 08/20/2014  Primary Physician: Otilio Miu, MD Primary Cardiologist: PNah  Chief Complaint: dyspnea    HPI Joe Kennedy is a 74 y.o. male   He has known strep. His endocarditis which he has been on antibiotics with anticipation of aortic valve replacement for severe aortic regurgitation as well as "2 vessel disease not amenable to intervention". This is identified a catheterization by Dr. Clayborn Bigness.  He apparently had a syncopal episode 3/16 was found to have depressed LV function and a positive troponin--11. At that time Dr. Clayborn Bigness recommended transfer for bypass. He also turned out to have pneumonia and subsequent gram-positive bacteremia   Repeat echo 4/16 demonstrated normalization of LV function severe-moderate TR with apparently structurally normal valve. Pulmonary hypertension.  He also underwent repeat catheterization demonstrating moderate three-vessel disease. He has been seen by Dr. Arvid Right with anticipation of bypass at the time of his aortic valve replacement. This is been currently deferred because of severe oral disease for which he has been followed by oral surgery  having undergone extraction 4/13.  He has a history of atrial fibrillation currently treated with digoxin and amiodarone and apixaban. He is unaware of palpitations with his atrial fibrillation  Yesterday evening he awoke unable to breathe. This was responsive to nitroglycerin. He went back to bed and again awakened with dyspnea. EMS was called. His breathing has been better. He has been Chest pain-free   The troponin was borderline elevated at 0.08.  He notes that he failed to take his diuretics yesterday.   He is scheduled to see Dr. Buckner Malta on Wednesday  Past Medical History  Diagnosis Date  . Hypertension   . Myocardial infarction   . CHF (congestive heart  failure)   . Shortness of breath dyspnea   . Arthritis     KNEES  . Heart murmur   . Pneumonia     hx 3/15  . GERD (gastroesophageal reflux disease)     occ  . Status post PICC central line placement     placed for vanco infusion rt arm      Surgical History:  Past Surgical History  Procedure Laterality Date  . Tee without cardioversion N/A 07/11/2014    Procedure: TRANSESOPHAGEAL ECHOCARDIOGRAM (TEE);  Surgeon: Josue Hector, MD;  Location: [facility] ENDOSCOPY;  Service: Cardiovascular;  Laterality: N/A;  . Tonsillectomy    . Multiple extractions with alveoloplasty N/A 07/20/2014    Procedure: Extraction of tooth #'s 2,6,8,14,21,22,23,27,28,30 with alveoloplasty ;  Surgeon: Lenn Cal, DDS;  Location: [place];  Service: Oral Surgery;  Laterality: N/A;     Home Meds: Prior to Admission medications   Medication Sig Start Date End Date Taking? Authorizing Provider  amiodarone (PACERONE) 200 MG tablet Take 1 tablet (200 mg total) by mouth daily. 07/15/14   Eugenie Filler, MD  digoxin (LANOXIN) 0.125 MG tablet Take 125 mcg by mouth daily. 06/17/14   Historical Provider, MD  ELIQUIS 5 MG TABS tablet Take 5 mg by mouth 2 (two) times daily.  07/18/14   Historical Provider, MD  feeding supplement, ENSURE ENLIVE, (ENSURE ENLIVE) LIQD Take 237 mLs by mouth 2 (two) times daily between meals. 07/15/14   Eugenie Filler, MD  furosemide (LASIX) 40 MG tablet Take 1.5 tablets (60 mg total) by mouth daily. 07/26/14   Burtis Junes, NP  HYDROcodone-acetaminophen (NORCO/VICODIN)  5-325 MG per tablet Take 1 tablet by mouth every 4 (four) hours as needed.  07/20/14   Historical Provider, MD  metoprolol tartrate (LOPRESSOR) 50 MG tablet Take 1 tablet (50 mg total) by mouth 2 (two) times daily. 07/15/14   Eugenie Filler, MD  NITROSTAT 0.4 MG SL tablet Take 0.4 mg by mouth. Dissolve 1 tablet under the tongue every 5 minutes as needed for chest pain up to 3 times. 06/23/14   Historical Provider, MD  pantoprazole  (PROTONIX) 40 MG tablet Take 1 tablet (40 mg total) by mouth daily. 07/15/14   Eugenie Filler, MD  potassium chloride SA (K-DUR,KLOR-CON) 20 MEQ tablet Take 2 tablets (40 mEq total) by mouth daily. 07/15/14   Eugenie Filler, MD  sodium chloride 0.9 % infusion Inject into the vein once.  07/15/14   Historical Provider, MD  traMADol (ULTRAM) 50 MG tablet Take 50 mg by mouth 3 (three) times daily as needed for moderate pain.    Historical Provider, MD  vancomycin (VANCOCIN) 10 G SOLR injection Inject into the vein daily.  07/15/14   Historical Provider, MD    Inpatient Medications:  . amiodarone  200 mg Oral Daily  . [START ON 08/21/2014] aspirin EC  81 mg Oral Daily  . digoxin  125 mcg Oral Daily  . enoxaparin (LOVENOX) injection  1 mg/kg Subcutaneous Q12H  . feeding supplement (ENSURE ENLIVE)  237 mL Oral BID BM  . furosemide  60 mg Oral Daily  . metoprolol tartrate  50 mg Oral BID  . pantoprazole  40 mg Oral Daily  . potassium chloride SA  40 mEq Oral Daily    Allergies:  Allergies  Allergen Reactions  . Lipitor [Atorvastatin] Other (See Comments)    Abdominal pain, nausea and chest pain  . Penicillins     Difficulty breathing    History   Social History  . Marital Status: Married    Spouse Name: N/A  . Number of Children: N/A  . Years of Education: N/A   Occupational History  . Not on file.   Social History Main Topics  . Smoking status: Never Smoker   . Smokeless tobacco: Never Used  . Alcohol Use: 0.6 oz/week    1 Cans of beer per week  . Drug Use: No  . Sexual Activity: Not on file   Other Topics Concern  . Not on file   Social History Narrative     Family History  Problem Relation Age of Onset  . Coronary artery disease Brother      ROS:  Please see the history of present illness.     All other systems reviewed and negative.    Physical Exam:   Blood pressure 141/33, pulse 59, temperature 98 F (36.7 C), temperature source Oral, resp. rate 16, height  6' (1.829 m), weight 168 lb 3.2 oz (76.295 kg), SpO2 99 %. General: Well developed, well nourished male in no acute distress. Head: Normocephalic, atraumatic, sclera non-icteric, no xanthomas, nares are without discharge. EENT: normal  Lymph Nodes:  none Neck: Negative for carotid bruits. JVD not elevated. Back:without scoliosis kyphosis  Lungs: Clear bilaterally to auscultation without wheezes, rales, or rhonchi. Breathing is unlabored. Heart: RRR with S1 S2. There is a 2/6 systolic murmur and a 3 over 4 mid diastolic terminating* murmur . No rubs, or gallops appreciated. Abdomen: Soft, non-tender, non-distended with normoactive bowel sounds. No hepatomegaly. No rebound/guarding. No obvious abdominal masses. Msk:  Strength and tone appear normal  for age. Extremities: No clubbing or cyanosis. No  edema.  Distal pedal pulses are 2+ and equal bilaterally. Skin: Warm and Dry Neuro: Alert and oriented X 3. CN III-XII intact Grossly normal sensory and motor function . Psych:  Responds to questions appropriately with a normal affect.      Labs: Cardiac Enzymes  Recent Labs  08/20/14 0426  TROPONINI 0.08*   CBC Lab Results  Component Value Date   WBC 11.8* 08/20/2014   HGB 12.7* 08/20/2014   HCT 40.1 08/20/2014   MCV 83.2 08/20/2014   PLT 266 08/20/2014   PROTIME: No results for input(s): LABPROT, INR in the last 72 hours. Chemistry  Recent Labs Lab 08/20/14 0426  NA 137  K 4.3  CL 105  CO2 23  BUN 24*  CREATININE 1.57*  CALCIUM 8.9  GLUCOSE 125*   Lipids Lab Results  Component Value Date   CHOL 82 07/07/2014   HDL 18* 07/07/2014   LDLCALC 43 07/07/2014   TRIG 103 07/07/2014   BNP PRO B NATRIURETIC PEPTIDE (BNP)  Date/Time Value Ref Range Status  08/02/2014 01:27 PM 1522.0* 0.0 - 100.0 pg/mL Final  07/25/2014 10:53 AM 1471.0* 0.0 - 100.0 pg/mL Final   Thyroid Function Tests: No results for input(s): TSH, T4TOTAL, T3FREE, THYROIDAB in the last 72  hours.  Invalid input(s): FREET3 Miscellaneous No results found for: DDIMER  Radiology/Studies:  Dg Chest Port 1 View  08/20/2014   CLINICAL DATA:  Chest pain earlier but not right now. Shortness of breath and cough for 1 day.  EXAM: PORTABLE CHEST - 1 VIEW  COMPARISON:  07/06/2014  FINDINGS: Mild cardiac enlargement. No pulmonary vascular congestion. There appears to be patchy focal areas of airspace disease in the perihilar regions bilaterally suggesting pneumonia. Atelectasis or infiltration also in the left lung base. Degenerative changes in the spine and shoulders. Mediastinal contours are intact.  IMPRESSION: Mild cardiac enlargement. Vague patchy perihilar airspace disease with infiltration or atelectasis in the lung bases.   Electronically Signed   By: Lucienne Capers M.D.   On: 08/20/2014 05:11    EKG:  Sinus with occ ectopics and withjout ischemic changes   Assessment and Plan:  Atrial fibrillation-paroxysmal  Ischemic heart disease with three-vessel disease and normal LV function  Aortic insufficiency secondary to aortic valve endocarditis with anticipated aortic valve replacement/CABG  Acute Dyspnea  The patient presents with acute dyspnea responsive to nitroglycerin and most consistent with heart failure. It is possible that it was simply related to the fact that he missed his diuretics. Another explanation would be transient atrial arrhythmias which  he is aware. It is unlikely given the significant improvement in his symptoms without further interventions that relates to progressive endocarditis i.e. potentially involving the mitral valve.  I would resume  his oral diuretics. If he is stable in the morning, I would anticipate him discharged with follow-up with Dr. Cyndia Bent for timing of surgery scheduled for Wednesday.  Old charts were reviewed and laboratories are stable  Will repeat Tn         Joe Kennedy

## 2014-08-20 NOTE — H&P (Signed)
Joe Kennedy is an 74 y.o. male.  HPI: 74 y/o male with history of strep viridans aortic valve endocarditis with severe AI, severe multi-vessel coronary artery disease, and hypertension who presents with above complaint. Last evening at [place] 9:30 patient develop shortness of breath and chest pain. He took nitroglycerin which relieved the pain. He went to sleep and then it began around 2:30 AM he woke up with short of breath and chest pressure again he took a nitroglycerin and his wife decided to bring him to the ER for further evaluation. Patient is scheduled to see Dr. Gita Kudo [facility] on Wednesday for aVR and CABG. Patient is currently chest pain-free.Patient has a slightly elevated troponin at 0.08.  Past Medical History  Diagnosis Date  . Hypertension   . Myocardial infarction   . CHF (congestive heart failure)   . Shortness of breath dyspnea   . Arthritis     KNEES  . Heart murmur   . Pneumonia     hx 3/15  . GERD (gastroesophageal reflux disease)     occ  . Status post PICC central line placement     placed for vanco infusion rt arm  endocarditis  Past Surgical History  Procedure Laterality Date  . Tee without cardioversion N/A 07/11/2014    Procedure: TRANSESOPHAGEAL ECHOCARDIOGRAM (TEE); Surgeon: Josue Hector, MD; Location: [facility] ENDOSCOPY; Service: Cardiovascular; Laterality: N/A;  . Tonsillectomy    . Multiple extractions with alveoloplasty N/A 07/20/2014    Procedure: Extraction of tooth #'s 2,6,8,14,21,22,23,27,28,30 with alveoloplasty ; Surgeon: Lenn Cal, DDS; Location: [place]; Service: Oral Surgery; Laterality: N/A;    Family History  Problem Relation Age of Onset  . Coronary artery disease Brother     Social History:  reports that he has never smoked. He has never used smokeless tobacco. He reports that he drinks about 0.6 oz of alcohol per  week. He reports that he does not use illicit drugs.  Allergies:  Allergies  Allergen Reactions  . Lipitor [Atorvastatin] Other (See Comments)    Abdominal pain, nausea and chest pain  . Penicillins     Difficulty breathing    Medications: Prior to Admission:  Amiodarone 20 mg daily Digoxin 0.125 mg daily Eliquis 5 mg PO BID Lasix 40 mg daily Metoprolol 50 mg by mouth twice a day Nitroglycerin sublingual when necessary chest pain Protonix 40 mg daily KCl 20 mEq daily   Lab Results Last 48 Hours    Results for orders placed or performed during the hospital encounter of 08/20/14 (from the past 48 hour(s))  CBC Status: Abnormal   Collection Time: 08/20/14 4:26 AM  Result Value Ref Range   WBC 11.8 (H) 3.8 - 10.6 K/uL   RBC 4.82 4.40 - 5.90 MIL/uL   Hemoglobin 12.7 (L) 13.0 - 18.0 g/dL   HCT 40.1 40.0 - 52.0 %   MCV 83.2 80.0 - 100.0 fL   MCH 26.3 26.0 - 34.0 pg   MCHC 31.6 (L) 32.0 - 36.0 g/dL   RDW 20.2 (H) 11.5 - 14.5 %   Platelets 266 150 - 440 K/uL  Basic metabolic panel Status: Abnormal   Collection Time: 08/20/14 4:26 AM  Result Value Ref Range   Sodium 137 135 - 145 mmol/L   Potassium 4.3 3.5 - 5.1 mmol/L   Chloride 105 101 - 111 mmol/L   CO2 23 22 - 32 mmol/L   Glucose, Bld 125 (H) 65 - 99 mg/dL   BUN 24 (  H) 6 - 20 mg/dL   Creatinine, Ser 1.57 (H) 0.61 - 1.24 mg/dL   Calcium 8.9 8.9 - 10.3 mg/dL   GFR calc non Af Amer 41 (L) >60 mL/min   GFR calc Af Amer 48 (L) >60 mL/min    Comment: (NOTE) The eGFR has been calculated using the CKD EPI equation. This calculation has not been validated in all clinical situations. eGFR's persistently <60 mL/min signify possible Chronic Kidney Disease.    Anion gap 9 5 - 15  BNP (order ONLY if patient complains of dyspnea/SOB AND you have documented it for THIS visit) Status: Abnormal    Collection Time: 08/20/14 4:26 AM  Result Value Ref Range   B Natriuretic Peptide 1769.0 (H) 0.0 - 100.0 pg/mL  Troponin I Status: Abnormal   Collection Time: 08/20/14 4:26 AM  Result Value Ref Range   Troponin I 0.08 (H) <0.031 ng/mL    Comment: READ BACK AND VERIFIED BY LAURIE ALLEN AT 4854 ON 08/20/14/RWW   PERSISTENTLY INCREASED TROPONIN VALUES IN THE RANGE OF 0.04-0.49 ng/mL CAN BE SEEN IN:  -UNSTABLE ANGINA  -CONGESTIVE HEART FAILURE  -MYOCARDITIS  -CHEST TRAUMA  -ARRYHTHMIAS  -LATE PRESENTING MYOCARDIAL INFARCTION  -COPD  CLINICAL FOLLOW-UP RECOMMENDED.       Dg Chest Port 1 View  08/20/2014 IMPRESSION: Mild cardiac enlargement. Vague patchy perihilar airspace disease with infiltration or atelectasis in the lung bases. Electronically Signed By: Lucienne Capers M.D. On: 08/20/2014 05:11    Review of Systems  Constitutional: Negative for fever, chills, weight loss and malaise/fatigue.  Eyes: Negative for blurred vision.  Respiratory: Positive for shortness of breath. Negative for cough, hemoptysis, sputum production and wheezing.  Cardiovascular: Positive for chest pain. Negative for palpitations, orthopnea, claudication, leg swelling and PND.  Gastrointestinal: Negative for heartburn, nausea, vomiting and abdominal pain.  Neurological: Negative for dizziness, tingling and headaches.   Blood pressure 158/38, pulse 68, temperature 97.6 F (36.4 C), temperature source Oral, resp. rate 22, height 6' (1.829 m), weight 78.926 kg (174 lb), SpO2 96 %. Physical Exam  Constitutional: He is oriented to person, place, and time. He appears well-developed and well-nourished.  HENT:  Head: Normocephalic and atraumatic.  Eyes: Pupils are equal, round, and reactive to light. No scleral icterus.  Neck: Normal range of motion. Neck supple. No tracheal deviation present.  Cardiovascular: Normal rate and  regular rhythm. Exam reveals no gallop Exam reveals no friction rub.  Murmur heard. Respiratory: Effort normal and breath sounds normal. No respiratory distress. He has no wheezes. He has no rales. He exhibits no tenderness.  GI: Soft. Bowel sounds are normal. He exhibits no distension. There is no tenderness. There is no rebound.  Musculoskeletal: Normal range of motion. He exhibits no edema or tenderness.  Neurological: He is alert and oriented to person, place, and time. No cranial nerve deficit.  Skin: Skin is warm and dry. No rash noted. No erythema. No pallor.  Psychiatric: He has a normal mood and affect.    Assessment/Plan: This is a 74 year old male with a history of aortic valve endocarditis, atrial fibrillation on Eliquis, and severe three-vessel coronary artery disease requiring CABG who presents with non-ST elevation MI.  1.non-ST elevation MI: Dr. Dahlia Client has called [facility]. He recommended the patient stay here for further evaluation and treatment as his surgery for CABG and aVR is an elective surgery. I will consult cardiology. I will hold Eliquis and start full dose Lovenox. We will continue beta blocker, start aspirin and nitroglycerin when  necessary. He is intolerable to statins so I have not started statin. He will need to be referred back to [facility] at discharge. He has an appointment scheduled on Wednesday with Dr. Cyndia Bent    2. Atrial fibrillation: Patient is in normal sinus rhythm at this time. If you are going to treat him for problem #1 I would hold Eliquis and use Lovenox full dose. I would continue digoxin and metoprolol and amiodarone.  3. History of endocarditis: Patient is no longer on vancomycin. He will need aortic valve replacement. Patient is currently seeing Dr. Wilford [facility]. The plan is for him to follow up on Wednesday to schedule CABG and aortic valve replacement.  4. Essential hypertension: Patient will continue metoprolol.  5. Chronic  systolic heart failure: Patient does not seem to be in exacerbation at this time. Patient should continue Lasix and metoprolol.   TIME 55 mniutes FULL CODE Plan discussed with patient and family.

## 2014-08-20 NOTE — Consult Note (Signed)
Reason for Consult:chest pain/SOB  Referring Physician: Dr. Levy Sjogren is an 74 y.o. male.  HPI: 74 y/o male with history of strep viridans aortic valve endocarditis with severe AI, severe multi-vessel coronary artery disease, and hypertension who presents with above complaint. Last evening at [place] 9:30 patient develop shortness of breath and chest pain. He took nitroglycerin which relieved the pain. He went to sleep and then it began around 2:30 AM he woke up with short of breath and chest pressure again he took a nitroglycerin and his wife decided to bring him to the ER for further evaluation. Patient is scheduled to see Dr. Dema Severin at [facility] on Wednesday for aVR and CABG. Patient is currently chest pain-free.Patient has a slightly elevated troponin at 0.08.  Past Medical History  Diagnosis Date  . Hypertension   . Myocardial infarction   . CHF (congestive heart failure)   . Shortness of breath dyspnea   . Arthritis     KNEES  . Heart murmur   . Pneumonia     hx 3/15  . GERD (gastroesophageal reflux disease)     occ  . Status post PICC central line placement     placed for vanco infusion rt arm  endocarditis   Past Surgical History  Procedure Laterality Date  . Tee without cardioversion N/A 07/11/2014    Procedure: TRANSESOPHAGEAL ECHOCARDIOGRAM (TEE);  Surgeon: Josue Hector, MD;  Location: [facility] ENDOSCOPY;  Service: Cardiovascular;  Laterality: N/A;  . Tonsillectomy    . Multiple extractions with alveoloplasty N/A 07/20/2014    Procedure: Extraction of tooth #'s 2,6,8,14,21,22,23,27,28,30 with alveoloplasty ;  Surgeon: Lenn Cal, DDS;  Location: [place];  Service: Oral Surgery;  Laterality: N/A;    Family History  Problem Relation Age of Onset  . Coronary artery disease Brother     Social History:  reports that he has never smoked. He has never used smokeless tobacco. He reports that he drinks about 0.6 oz of alcohol per week.  He reports that he does not use illicit drugs.  Allergies:  Allergies  Allergen Reactions  . Lipitor [Atorvastatin] Other (See Comments)    Abdominal pain, nausea and chest pain  . Penicillins     Difficulty breathing    Medications: Prior to Admission:  Amiodarone 20 mg daily Digoxin 0.125 mg daily Eliquis 5 mg PO BID Lasix 40 mg daily Metoprolol 50 mg by mouth twice a day Nitroglycerin sublingual when necessary chest pain Protonix 40 mg daily KCl 20 mEq daily  Results for orders placed or performed during the hospital encounter of 08/20/14 (from the past 48 hour(s))  CBC     Status: Abnormal   Collection Time: 08/20/14  4:26 AM  Result Value Ref Range   WBC 11.8 (H) 3.8 - 10.6 K/uL   RBC 4.82 4.40 - 5.90 MIL/uL   Hemoglobin 12.7 (L) 13.0 - 18.0 g/dL   HCT 40.1 40.0 - 52.0 %   MCV 83.2 80.0 - 100.0 fL   MCH 26.3 26.0 - 34.0 pg   MCHC 31.6 (L) 32.0 - 36.0 g/dL   RDW 20.2 (H) 11.5 - 14.5 %   Platelets 266 150 - 440 K/uL  Basic metabolic panel     Status: Abnormal   Collection Time: 08/20/14  4:26 AM  Result Value Ref Range   Sodium 137 135 - 145 mmol/L   Potassium 4.3 3.5 - 5.1 mmol/L   Chloride 105 101 - 111 mmol/L  CO2 23 22 - 32 mmol/L   Glucose, Bld 125 (H) 65 - 99 mg/dL   BUN 24 (H) 6 - 20 mg/dL   Creatinine, Ser 1.57 (H) 0.61 - 1.24 mg/dL   Calcium 8.9 8.9 - 10.3 mg/dL   GFR calc non Af Amer 41 (L) >60 mL/min   GFR calc Af Amer 48 (L) >60 mL/min    Comment: (NOTE) The eGFR has been calculated using the CKD EPI equation. This calculation has not been validated in all clinical situations. eGFR's persistently <60 mL/min signify possible Chronic Kidney Disease.    Anion gap 9 5 - 15  BNP (order ONLY if patient complains of dyspnea/SOB AND you have documented it for THIS visit)     Status: Abnormal   Collection Time: 08/20/14  4:26 AM  Result Value Ref Range   B Natriuretic Peptide 1769.0 (H) 0.0 - 100.0 pg/mL  Troponin I     Status: Abnormal   Collection  Time: 08/20/14  4:26 AM  Result Value Ref Range   Troponin I 0.08 (H) <0.031 ng/mL    Comment: READ BACK AND VERIFIED  BY LAURIE ALLEN AT 4332 ON 08/20/14/RWW        PERSISTENTLY INCREASED TROPONIN VALUES IN THE RANGE OF 0.04-0.49 ng/mL CAN BE SEEN IN:       -UNSTABLE ANGINA       -CONGESTIVE HEART FAILURE       -MYOCARDITIS       -CHEST TRAUMA       -ARRYHTHMIAS       -LATE PRESENTING MYOCARDIAL INFARCTION       -COPD   CLINICAL FOLLOW-UP RECOMMENDED.     Dg Chest Port 1 View  08/20/2014  IMPRESSION: Mild cardiac enlargement. Vague patchy perihilar airspace disease with infiltration or atelectasis in the lung bases.   Electronically Signed   By: Lucienne Capers M.D.   On: 08/20/2014 05:11    Review of Systems  Constitutional: Negative for fever, chills, weight loss and malaise/fatigue.  Eyes: Negative for blurred vision.  Respiratory: Positive for shortness of breath. Negative for cough, hemoptysis, sputum production and wheezing.   Cardiovascular: Positive for chest pain. Negative for palpitations, orthopnea, claudication, leg swelling and PND.  Gastrointestinal: Negative for heartburn, nausea, vomiting and abdominal pain.  Neurological: Negative for dizziness, tingling and headaches.   Blood pressure 158/38, pulse 68, temperature 97.6 F (36.4 C), temperature source Oral, resp. rate 22, height 6' (1.829 m), weight 78.926 kg (174 lb), SpO2 96 %. Physical Exam  Constitutional: He is oriented to person, place, and time. He appears well-developed and well-nourished.  HENT:  Head: Normocephalic and atraumatic.  Eyes: Pupils are equal, round, and reactive to light. No scleral icterus.  Neck: Normal range of motion. Neck supple. No tracheal deviation present.  Cardiovascular: Normal rate and regular rhythm.  Exam reveals gallop. Exam reveals no friction rub.   Murmur heard. Respiratory: Effort normal and breath sounds normal. No respiratory distress. He has no wheezes. He has no  rales. He exhibits no tenderness.  GI: Soft. Bowel sounds are normal. He exhibits no distension. There is no tenderness. There is no rebound.  Musculoskeletal: Normal range of motion. He exhibits no edema or tenderness.  Neurological: He is alert and oriented to person, place, and time. No cranial nerve deficit.  Skin: Skin is warm and dry. No rash noted. No erythema. No pallor.  Psychiatric: He has a normal mood and affect.    Assessment/Plan: This is a 74 year old male  with a history of aortic valve endocarditis, atrial fibrillation on Eliquis, and severe three-vessel coronary artery disease requiring CABG who presents with non-ST elevation MI.  1.non-ST elevation MI: Patient has received care and is plan to see his cardiac thoracic surgery and on Wednesday, Dr. Dema Severin. Patient would be best served at [facility] where his cardiologist and cardiothoracic surgeon are. In the interim I would anticoagulate the patient with Lovenox and hold his Eliquis for now.  2. Atrial fibrillation: Patient is in normal sinus rhythm at this time. If you are going to treat him for problem #1 I would hold Eliquis and use Lovenox full dose. I would continue digoxin and metoprolol and amiodarone.  3. History of endocarditis: Patient is no longer on vancomycin. He will need a air to valve replacement. Patient is currently seeing Dr. Dema Severin at [facility]. The plan is for him to follow up on Wednesday to schedule CABG and aortic valve replacement.  4. Essential hypertension: Patient will continue metoprolol.  5. Chronic systolic heart failure: Patient does not seem to be in exacerbation at this time. Patient should continue Lasix and metoprolol.  I have discussed my findings with Dr. Dahlia Client, emergency department physician. Patient would best be served at [facility]. Dr. Pollyann Samples calling for transfer.   Kinnley Paulson 08/20/2014, 7:28 AM

## 2014-08-20 NOTE — ED Provider Notes (Signed)
[facility] Emergency Department Provider Note  ____________________________________________  Time seen: Approximately 426 AM  I have reviewed the triage vital signs and the nursing notes.   HISTORY  Chief Complaint Chest Pain and Shortness of Breath    HPI JLON BETKER is a 74 y.o. male who comes into the emergency department with shortness of breath and chest pain. The patient reports that approximately around 2100 was going to bed and felt as though he couldn't breathe. He reports it was worse when he was laying down. The patient reports that he did have some chest pain. He woke his wife up at approximately 1 AM and was given nitroglycerin which did help improve the chest pain but the patient reports that he still felt as though he couldn't breathe. He reports that the difficulty breathing does come and go but it was concerning which made him come in to the emergency department. The patient did have some sweats and cough with no nausea and vomiting. He reports that he does have some mild upper abdominal pain. He denies fever. The patient has had an MI in the past and is concerned that the chest pain and shortness of breath is similar to his MI which is had in the past.Currently the patient's chest pain is gone but he said it felt like tightness and pressure.   Past Medical History  Diagnosis Date  . Hypertension   . Myocardial infarction   . CHF (congestive heart failure)   . Shortness of breath dyspnea   . Arthritis     KNEES  . Heart murmur   . Pneumonia     hx 3/15  . GERD (gastroesophageal reflux disease)     occ  . Status post PICC central line placement     placed for vanco infusion rt arm    Patient Active Problem List   Diagnosis Date Noted  . Severe aortic insufficiency   . Knee pain, acute   . Aortic regurgitation   . Acute and subacute infective endocarditis in diseases classified elsewhere   . Protein-calorie malnutrition, severe  07/08/2014  . Viridans streptococci infection 07/08/2014  . Unintentional weight loss 07/08/2014  . Acute pain of right knee 07/08/2014  . Normocytic anemia 07/08/2014  . Hypertension 07/08/2014  . Coronary artery disease 07/08/2014  . Dyslipidemia 07/08/2014  . NSTEMI (non-ST elevated myocardial infarction) 07/06/2014  . Bacteremia 07/06/2014  . Atrial fibrillation 07/06/2014  . Chronic systolic CHF (congestive heart failure) 07/06/2014  . Right lower lobe pneumonia 07/06/2014    Past Surgical History  Procedure Laterality Date  . Tee without cardioversion N/A 07/11/2014    Procedure: TRANSESOPHAGEAL ECHOCARDIOGRAM (TEE);  Surgeon: Josue Hector, MD;  Location: [facility] ENDOSCOPY;  Service: Cardiovascular;  Laterality: N/A;  . Tonsillectomy    . Multiple extractions with alveoloplasty N/A 07/20/2014    Procedure: Extraction of tooth #'s 2,6,8,14,21,22,23,27,28,30 with alveoloplasty ;  Surgeon: Lenn Cal, DDS;  Location: [facility];  Service: Oral Surgery;  Laterality: N/A;    Current Outpatient Rx  Name  Route  Sig  Dispense  Refill  . amiodarone (PACERONE) 200 MG tablet   Oral   Take 1 tablet (200 mg total) by mouth daily.   30 tablet   0   . digoxin (LANOXIN) 0.125 MG tablet   Oral   Take 125 mcg by mouth daily.      6   . ELIQUIS 5 MG TABS tablet   Oral   Take 5  mg by mouth 2 (two) times daily.            Dispense as written.   . feeding supplement, ENSURE ENLIVE, (ENSURE ENLIVE) LIQD   Oral   Take 237 mLs by mouth 2 (two) times daily between meals.   237 mL   12   . furosemide (LASIX) 40 MG tablet   Oral   Take 1.5 tablets (60 mg total) by mouth daily.   45 tablet   6   . HYDROcodone-acetaminophen (NORCO/VICODIN) 5-325 MG per tablet   Oral   Take 1 tablet by mouth every 4 (four) hours as needed.       0   . metoprolol tartrate (LOPRESSOR) 50 MG tablet   Oral   Take 1 tablet (50 mg total) by mouth 2 (two) times daily.   62 tablet   0   . NITROSTAT  0.4 MG SL tablet   Oral   Take 0.4 mg by mouth. Dissolve 1 tablet under the tongue every 5 minutes as needed for chest pain up to 3 times.      0     Dispense as written.   . pantoprazole (PROTONIX) 40 MG tablet   Oral   Take 1 tablet (40 mg total) by mouth daily.   30 tablet   0   . potassium chloride SA (K-DUR,KLOR-CON) 20 MEQ tablet   Oral   Take 2 tablets (40 mEq total) by mouth daily.   30 tablet   0   . sodium chloride 0.9 % infusion   Intravenous   Inject into the vein once.          . traMADol (ULTRAM) 50 MG tablet   Oral   Take 50 mg by mouth 3 (three) times daily as needed for moderate pain.         . vancomycin (VANCOCIN) 10 G SOLR injection   Intravenous   Inject into the vein daily.            Allergies Lipitor and Penicillins  Family History  Problem Relation Age of Onset  . Coronary artery disease Brother     Social History History  Substance Use Topics  . Smoking status: Never Smoker   . Smokeless tobacco: Never Used  . Alcohol Use: 0.6 oz/week    1 Cans of beer per week    Review of Systems Constitutional: No fever/chills Eyes: No visual changes. ENT: No sore throat. Cardiovascular: chest pain. Respiratory: shortness of breath. Gastrointestinal:  abdominal pain.  No nausea, no vomiting.   Genitourinary: Negative for dysuria. Musculoskeletal: Negative for back pain. Skin: Negative for rash. Neurological: Negative for headaches, focal weakness or numbness.  10-point ROS otherwise negative.  ____________________________________________   PHYSICAL EXAM:  VITAL SIGNS: ED Triage Vitals  Enc Vitals Group     BP 08/20/14 0500 143/42 mmHg     Pulse Rate 08/20/14 0403 69     Resp 08/20/14 0403 24     Temp 08/20/14 0403 97.6 F (36.4 C)     Temp Source 08/20/14 0403 Oral     SpO2 08/20/14 0403 100 %     Weight 08/20/14 0509 174 lb (78.926 kg)     Height 08/20/14 0403 6' (1.829 m)     Head Cir --      Peak Flow --       Pain Score 08/20/14 0413 0     Pain Loc --      Pain Edu? --  Excl. in [place]? --     Constitutional: Alert and oriented. Well appearing and in mild distress. Eyes: Conjunctivae are normal. PERRL. EOMI. Head: Atraumatic. Nose: No congestion/rhinnorhea. Mouth/Throat: Mucous membranes are moist.  Oropharynx non-erythematous. Cardiovascular: Normal rate, regular rhythm. Grossly normal heart sounds.  Good peripheral circulation. Respiratory: Normal respiratory effort.  No retractions. Lungs CTAB. Gastrointestinal: Soft and nontender. No distention. Positive bowel sounds  Genitourinary: Deferred Musculoskeletal: No lower extremity tenderness nor edema.   Neurologic:  Normal speech and language. No gross focal neurologic deficits are appreciated. Speech is normal. No gait instability. Skin:  Skin is warm, dry and intact. No rash noted. Psychiatric: Mood and affect are normal. Speech and behavior are normal.  ____________________________________________   LABS (all labs ordered are listed, but only abnormal results are displayed)  Labs Reviewed  CBC - Abnormal; Notable for the following:    WBC 11.8 (*)    Hemoglobin 12.7 (*)    MCHC 31.6 (*)    RDW 20.2 (*)    All other components within normal limits  BASIC METABOLIC PANEL - Abnormal; Notable for the following:    Glucose, Bld 125 (*)    BUN 24 (*)    Creatinine, Ser 1.57 (*)    GFR calc non Af Amer 41 (*)    GFR calc Af Amer 48 (*)    All other components within normal limits  BRAIN NATRIURETIC PEPTIDE - Abnormal; Notable for the following:    B Natriuretic Peptide 1769.0 (*)    All other components within normal limits  TROPONIN I - Abnormal; Notable for the following:    Troponin I 0.08 (*)    All other components within normal limits   ____________________________________________  EKG  ED ECG REPORT   Date: 08/20/2014  EKG Time: 4:15  Rate: 62  Rhythm: normal sinus rhythm  Axis: Left axis deviation   Intervals:none  ST&T Change: Less than 1 mm ST depression in leads 1, aVL, V6  ____________________________________________  RADIOLOGY  Chest x-ray: Mild cardiac enlargement, fake patchy perihilar airspace disease with infiltration or atelectasis in the lung bases ____________________________________________   PROCEDURES  Procedure(s) performed: None  Critical Care performed: No  ____________________________________________   INITIAL IMPRESSION / ASSESSMENT AND PLAN / ED COURSE  Pertinent labs & imaging results that were available during my care of the patient were reviewed by me and considered in my medical decision making (see chart for details).  This is a 74 year old male with a history of MI and significant cardiac disease who comes in today with some chest pain and shortness of breath. The patient reports that the shortness of breath has persisted although the chest pain has improved which is what brought him into the hospital today. The patient does have some elevation of his BMP as well as what looks like some edema on his chest x-ray. The patient also does have a mildly elevated troponin. I will admit the patient to the hospitalist service for further evaluation of his elevated troponin as well as the fluid in his lungs. The patient received a dose of Lovenox and aspirin and Lasix. I will discuss the patient's care with the hospitalist service for admittance of the hospital.  I contacted [facility] cardiology and spoke to Dr. Ron Parker about the patient. I discussed the possibility of transferring the patient given his need for surgery with the cardiothoracic surgeon. Dr. Ron Parker felt that the patient was stable at this time and did not need emergent intervention. He felt that  the patient could likely stay here to be stabilized and then follow-up with arterial thoracic surgeon as scheduled. I discussed this with the hospitalist who will admit the patient. Since the patient is on eliquis I  will DC the Lovenox. ____________________________________________   FINAL CLINICAL IMPRESSION(S) / ED DIAGNOSES  Final diagnoses:  Chest pain CHF exacerbation      Loney Hering, MD 08/20/14 [phone]

## 2014-08-21 NOTE — Progress Notes (Signed)
Discharge:  Pt d/c from room via wheelchair, wife with pt.  Discharge instructions given to the patient and wife.  No questions from pt,  reintegrated to the pt to call or go to the ED for chest discomfort.  Pt dressed in street clothes and left with discharge papers in hand

## 2014-08-21 NOTE — Progress Notes (Signed)
Pt alert and oriented x4, no complaints of pain or discomfort.  Bed in low position, call bell within reach.  Bed alarms on and functioning.  Assessment done and charted.  Will continue to monitor and do hourly rounding throughout the shiftPain noted 0  /10./  Pt very excited about going home.  Wife in room with the pt.  Assessment done and charted.  Will continue to assess till d/c is complete

## 2014-08-21 NOTE — Discharge Summary (Signed)
[facility] [place] NAME: Joe Kennedy    MR#:  [id number]  DATE OF BIRTH:  [birth year]  DATE OF ADMISSION:  08/20/2014 ADMITTING PHYSICIAN: Bettey Costa, MD  DATE OF DISCHARGE:08/21/2014 11:09 AM  PRIMARY CARE PHYSICIAN: Otilio Miu, MD    ADMISSION DIAGNOSIS:  Other chest pain [R07.89] CHF exacerbation [I50.9]  DISCHARGE DIAGNOSIS:  Active Problems:   NSTEMI (non-ST elevated myocardial infarction)   CHF exacerbation   SECONDARY DIAGNOSIS:   Past Medical History  Diagnosis Date  . Hypertension   . Myocardial infarction   . CHF (congestive heart failure)   . Shortness of breath dyspnea   . Arthritis     KNEES  . Heart murmur   . Pneumonia     hx 3/15  . GERD (gastroesophageal reflux disease)     occ  . Status post PICC central line placement     placed for vanco infusion rt arm    HOSPITAL COURSE:  This is a 74 year old male with a history of aortic valve endocarditis and severe coronary artery disease who presented with shortness of breath and chest pain. For further details please refer the H&P.  1. Chest pain: Patient's chest pain seemed to be associated with his dyspnea. His first set of troponins were slightly elevated. We monitored 2 more sets of troponins, basically these were not indicative of acute coronary syndrome. He did not have chest pain throughout his hospitalization. He does have severe coronary artery disease in the plan is for him to have follow-up with Dr. Mohammed Kindle on Wednesday at [facility]. He is planned for a CABG as well as in aortic valve replacement. I appreciate cardiology consultation. Patient will continue his outpatient medications.  2. Acute dyspnea: This is likely secondary to acute on chronic systolic heart failure and diastolic heart failure. Patient forgot to take his Lasix. He was given 1 dose of Lasix in the emergency department. Patient had no dyspnea during admission. Patient is not  short of breath at discharge. Patient has no crackles on examination at discharge.  3. Endocarditis: Apparently patient has completed his course of antibiotics. He is planned for an aortic valve replacement at [facility]. He needs to follow up on Wednesday with his scheduled appointment with Dr. Mohammed Kindle.  4. Atrophic fibrillation: Patient was in normal sinus rhythm during hospital physician. Patient will continue on his outpatient medications including Eliquis.  5. Essential hypertension: Patient will continue with metoprolol.   DISCHARGE CONDITIONS AND DIET:  Patient is stable for discharge ADA/heart healthy diet  CONSULTS OBTAINED:    Cardiology DRUG ALLERGIES:   Allergies  Allergen Reactions  . Lipitor [Atorvastatin] Other (See Comments)    Abdominal pain, nausea and chest pain  . Penicillins     Difficulty breathing    DISCHARGE MEDICATIONS:   Discharge Medication List as of 08/21/2014 10:11 AM    CONTINUE these medications which have NOT CHANGED   Details  amiodarone (PACERONE) 200 MG tablet Take 1 tablet (200 mg total) by mouth daily., Starting 07/15/2014, Until Discontinued, Print    digoxin (LANOXIN) 0.125 MG tablet Take 125 mcg by mouth daily., Starting 06/17/2014, Until Discontinued, Historical Med    ELIQUIS 5 MG TABS tablet Take 5 mg by mouth 2 (two) times daily. , Starting 07/18/2014, Until Discontinued, Historical Med    feeding supplement, ENSURE ENLIVE, (ENSURE ENLIVE) LIQD Take 237 mLs by mouth 2 (two) times daily between meals., Starting 07/15/2014, Until Discontinued, No Print  furosemide (LASIX) 40 MG tablet Take 1.5 tablets (60 mg total) by mouth daily., Starting 07/26/2014, Until Discontinued, Normal    HYDROcodone-acetaminophen (NORCO/VICODIN) 5-325 MG per tablet Take 1 tablet by mouth every 4 (four) hours as needed. , Starting 07/20/2014, Until Discontinued, Historical Med    metoprolol tartrate (LOPRESSOR) 50 MG tablet Take 1 tablet (50 mg total) by mouth  2 (two) times daily., Starting 07/15/2014, Until Discontinued, Print    NITROSTAT 0.4 MG SL tablet Take 0.4 mg by mouth. Dissolve 1 tablet under the tongue every 5 minutes as needed for chest pain up to 3 times., Starting 06/23/2014, Until Discontinued, Historical Med    pantoprazole (PROTONIX) 40 MG tablet Take 1 tablet (40 mg total) by mouth daily., Starting 07/15/2014, Until Discontinued, Print    potassium chloride SA (K-DUR,KLOR-CON) 20 MEQ tablet Take 2 tablets (40 mEq total) by mouth daily., Starting 07/15/2014, Until Discontinued, Print    traMADol (ULTRAM) 50 MG tablet Take 50 mg by mouth 3 (three) times daily as needed for moderate pain., Until Discontinued, Historical Med      STOP taking these medications     sodium chloride 0.9 % infusion      vancomycin (VANCOCIN) 10 G SOLR injection               Today   CHIEF COMPLAINT:  Patient is doing well this morning. Patient denies any shortness of breath or chest pain.   VITAL SIGNS:  Blood pressure 134/35, pulse 66, temperature 98.2 F (36.8 C), temperature source Oral, resp. rate 18, height 6' (1.829 m), weight 75.66 kg (166 lb 12.8 oz), SpO2 98 %.   REVIEW OF SYSTEMS:  Review of Systems  Constitutional: Negative for chills and weight loss.  Eyes: Negative for double vision.  Respiratory: Negative for cough, hemoptysis, sputum production and shortness of breath.   Cardiovascular: Negative for chest pain, palpitations, orthopnea, claudication, leg swelling and PND.  Gastrointestinal: Negative for vomiting, abdominal pain and diarrhea.     PHYSICAL EXAMINATION:  GENERAL:  74 y.o.-year-old patient lying in the bed with no acute distress.  NECK:  Supple, no jugular venous distention. No thyroid enlargement, no tenderness.  LUNGS: Normal breath sounds bilaterally, no wheezing, rales,rhonchi  No use of accessory muscles of respiration.  CARDIOVASCULAR: S1, S2 normal. 2/6 systolic ejection murmur heard best at the right  sternal border along with a diastolic heart murmur  ABDOMEN: Soft, non-tender, non-distended. Bowel sounds present. No organomegaly or mass.  EXTREMITIES: No pedal edema, cyanosis, or clubbing.  PSYCHIATRIC: The patient is alert and oriented x 3.  SKIN: No obvious rash, lesion, or ulcer.   DATA REVIEW:   CBC  Recent Labs Lab 08/20/14 0426  WBC 11.8*  HGB 12.7*  HCT 40.1  PLT 266    Chemistries   Recent Labs Lab 08/20/14 0426  NA 137  K 4.3  CL 105  CO2 23  GLUCOSE 125*  BUN 24*  CREATININE 1.57*  CALCIUM 8.9    Cardiac Enzymes  Recent Labs Lab 08/20/14 0426 08/20/14 1415  TROPONINI 0.08* 0.07*    Microbiology Results  @[email or web address] @  RADIOLOGY:  Dg Chest Port 1 View  08/20/2014   CLINICAL DATA:  Chest pain earlier but not right now. Shortness of breath and cough for 1 day.  EXAM: PORTABLE CHEST - 1 VIEW  COMPARISON:  07/06/2014  FINDINGS: Mild cardiac enlargement. No pulmonary vascular congestion. There appears to be patchy focal areas of airspace disease in the perihilar regions bilaterally suggesting  pneumonia. Atelectasis or infiltration also in the left lung base. Degenerative changes in the spine and shoulders. Mediastinal contours are intact.  IMPRESSION: Mild cardiac enlargement. Vague patchy perihilar airspace disease with infiltration or atelectasis in the lung bases.   Electronically Signed   By: Lucienne Capers M.D.   On: 08/20/2014 05:11      Management plans discussed with the patient and he is in agreement. Stable for discharge home  Patient should follow up with  Dr. Cyndia Bent on Wednesday  CODE STATUS:     Code Status Orders        Start     Ordered   08/20/14 0919  Full code   Continuous     08/20/14 0918    Advance Directive Documentation        Most Recent Value   Type of Advance Directive  Healthcare Power of Attorney   Pre-existing out of facility DNR order (yellow form or pink MOST form)     "MOST" Form in Place?         TOTAL TIME TAKING CARE OF THIS PATIENT 42minutes.    Ercel Normoyle M.D on 08/21/2014 at 11:14 AM  Between 7am to 6pm - Pager - [phone] After 6pm go to [email or web address] - password [email or web address] [facility]  [facility]  Office  [phone]  CC: Primary care physician; Otilio Miu, MD

## 2014-08-23 ENCOUNTER — Encounter: Payer: Self-pay | Admitting: Surgery

## 2014-08-23 ENCOUNTER — Ambulatory Visit (INDEPENDENT_AMBULATORY_CARE_PROVIDER_SITE_OTHER): Payer: Commercial Managed Care - HMO | Admitting: Surgery

## 2014-08-23 ENCOUNTER — Other Ambulatory Visit: Payer: Self-pay | Admitting: *Deleted

## 2014-08-23 VITALS — BP 126/35 | HR 56 | Resp 16 | Ht 72.0 in | Wt 182.0 lb

## 2014-08-23 DIAGNOSIS — I38 Endocarditis, valve unspecified: Secondary | ICD-10-CM

## 2014-08-23 DIAGNOSIS — I351 Nonrheumatic aortic (valve) insufficiency: Secondary | ICD-10-CM

## 2014-08-23 DIAGNOSIS — I2510 Atherosclerotic heart disease of native coronary artery without angina pectoris: Secondary | ICD-10-CM

## 2014-08-23 NOTE — Progress Notes (Signed)
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 He also has severe TR with no apparent vegetation on that valve and the valve structurally looks good. He has moderate CAD with no significant stenosis in the LAD but a significant antero-apical wall motion abnormality on LV gram. That could be due to embolic disease from the endocarditis. His LV function on the recent echos looks better than the cath. The OM is occluded but small and probably not graftable. The distal RCA has significant disease that could be grafted. He had poor dentition and this was felt to be the likely cause of his endocarditis so he underwent extraction of all of his teeth by Dr. Enrique Sack on 07/20/2014. He completed his antibiotic therapy at home. He has not had a return appt with the ID physician yet. He has also had this right knee pain and had a joint aspiration with only a small amount of fluid that had a negative gram stain and negative culture so far. An MR did show some osteoarthritis and extensive degenerative tearing of the medial meniscus but no specific signs of infection or osteo. He has significant malnutrition and says he has not been able to eat any solid food since dental extraction but his wife is trying to improve his nutrition with Ensure milkshakes and soup. He was just recently back in the hospital a few days ago with shortness of breath after he didn't take his lasix for a day. He had a BNP of 1769 and troponin was mildly elevated at .08, .07. He is still weak. He denies any fever or chills or sweats.   Current Outpatient Prescriptions  Medication Sig Dispense Refill  . amiodarone (PACERONE) 200 MG tablet Take 1 tablet (200 mg total) by mouth daily. 30 tablet 0  . digoxin (LANOXIN) 0.125 MG tablet Take 125 mcg by mouth daily.  6  . ELIQUIS 5 MG TABS tablet Take 5 mg by mouth 2  (two) times daily.     . feeding supplement, ENSURE ENLIVE, (ENSURE ENLIVE) LIQD Take 237 mLs by mouth 2 (two) times daily between meals. 237 mL 12  . furosemide (LASIX) 40 MG tablet Take 1.5 tablets (60 mg total) by mouth daily. 45 tablet 6  . HYDROcodone-acetaminophen (NORCO/VICODIN) 5-325 MG per tablet Take 1 tablet by mouth every 4 (four) hours as needed.   0  . metoprolol tartrate (LOPRESSOR) 50 MG tablet Take 1 tablet (50 mg total) by mouth 2 (two) times daily. 62 tablet 0  . NITROSTAT 0.4 MG SL tablet Take 0.4 mg by mouth. Dissolve 1 tablet under the tongue every 5 minutes as needed for chest pain up to 3 times.  0  . pantoprazole (PROTONIX) 40 MG tablet Take 1 tablet (40 mg total) by mouth daily. 30 tablet 0  . potassium chloride SA (K-DUR,KLOR-CON) 20 MEQ tablet Take 2 tablets (40 mEq total) by mouth daily. 30 tablet 0  . traMADol (ULTRAM) 50 MG tablet Take 50 mg by mouth 3 (three) times daily as needed for moderate pain.     No current facility-administered medications for this visit.     Physical Exam: BP 126/35 mmHg  Pulse 56  Resp 16  Ht 6' (1.829 m)  Wt 182 lb (82.555 kg)  BMI 24.68 kg/m2  SpO2 99% He looks tired but in no distress Cardiac exam shows a regular  rate and rhythm with a 3/6 diastolic murmur along the LLSB. Lungs are clear There is no peripheral edema   Diagnostic Tests:  He has a repeat echo on 08/17/2014 that I have reviewed personally. The report is not in the computer yet. The aortic valve is thickened but not clear vegetation present. There is severe AI. The mitral valve looks ok with no significant MR. The tricuspid valve looks ok with mild TR. LVEF 40-45% with anterior, anterolateral and inferolateral hypokinesis.  Impression:  He has severe AI following strep viridans endocarditis with NYHA class III heart failure symptoms of shortness of breath and fatigue with minimal activity and a recent admission for an exacerbation of heart failure after  stopping lasix for a day. He has completed antibiotic treatment for his endocarditis and I think it is time to proceed with surgery. His nutritional state is not great but I don't think it is going to improve if we wait. I think he will have a slow course and will need physical therapy postop. He has stage III CKD and moderate LV dysfunction. He has a history of PAF and is on Amiodarone and Eliquis. I would not plan to do a MAZE in this chronically ill gentleman who already requires a long surgery but would clip the left atrial appendage as long as there is no clot present on TEE in the OR.  I discussed the operative procedure of aortic valve replacement and CABG and clipping of the left atrial appendage with the patient and his wife including alternatives, benefits and risks; including but not limited to bleeding, blood transfusion, infection, stroke, myocardial infarction, graft failure, heart block requiring a permanent pacemaker, organ dysfunction, and death.  Joe Kennedy understands and agrees to proceed.     Plan:  AVR using a tissue valve and CABG Tuesday 08/29/2014. He will stop his Eliquis after today for surgery.    Gaye Pollack, MD [facility] [phone]

## 2014-08-24 ENCOUNTER — Other Ambulatory Visit: Payer: Self-pay | Admitting: *Deleted

## 2014-08-24 DIAGNOSIS — I351 Nonrheumatic aortic (valve) insufficiency: Secondary | ICD-10-CM

## 2014-08-24 DIAGNOSIS — I2510 Atherosclerotic heart disease of native coronary artery without angina pectoris: Secondary | ICD-10-CM

## 2014-08-24 DIAGNOSIS — I38 Endocarditis, valve unspecified: Secondary | ICD-10-CM

## 2014-08-25 ENCOUNTER — Telehealth: Payer: Self-pay | Admitting: Cardiovascular Disease

## 2014-08-28 ENCOUNTER — Encounter (HOSPITAL_COMMUNITY)
Admission: RE | Admit: 2014-08-28 | Discharge: 2014-08-28 | Disposition: A | Discharge status: Home / Self Care | Payer: Commercial Managed Care - HMO | Source: Ambulatory Visit | Attending: Surgery | Admitting: Surgery

## 2014-08-28 ENCOUNTER — Ambulatory Visit (HOSPITAL_COMMUNITY)
Admission: RE | Admit: 2014-08-28 | Discharge: 2014-08-28 | Disposition: A | Discharge status: Home / Self Care | Payer: Commercial Managed Care - HMO | Source: Ambulatory Visit | Attending: Surgery | Admitting: Surgery

## 2014-08-28 ENCOUNTER — Encounter (HOSPITAL_COMMUNITY): Payer: Self-pay | Admitting: Certified Registered Nurse Anesthetist

## 2014-08-28 VITALS — BP 120/26 | HR 53 | Resp 20 | Ht 72.0 in | Wt 165.2 lb

## 2014-08-28 DIAGNOSIS — I38 Endocarditis, valve unspecified: Secondary | ICD-10-CM | POA: Insufficient documentation

## 2014-08-28 DIAGNOSIS — Z01818 Encounter for other preprocedural examination: Secondary | ICD-10-CM | POA: Insufficient documentation

## 2014-08-28 DIAGNOSIS — I2510 Atherosclerotic heart disease of native coronary artery without angina pectoris: Secondary | ICD-10-CM | POA: Insufficient documentation

## 2014-08-28 DIAGNOSIS — I10 Essential (primary) hypertension: Secondary | ICD-10-CM

## 2014-08-28 DIAGNOSIS — R918 Other nonspecific abnormal finding of lung field: Secondary | ICD-10-CM | POA: Insufficient documentation

## 2014-08-28 DIAGNOSIS — I351 Nonrheumatic aortic (valve) insufficiency: Secondary | ICD-10-CM

## 2014-08-28 LAB — PROTIME-INR
INR: 1.11 (ref 0.00–1.49)
Prothrombin Time: 14.5 seconds (ref 11.6–15.2)

## 2014-08-28 LAB — COMPREHENSIVE METABOLIC PANEL
ALK PHOS: 86 U/L (ref 38–126)
ALT: 13 U/L — ABNORMAL LOW (ref 17–63)
AST: 16 U/L (ref 15–41)
Albumin: 3.2 g/dL — ABNORMAL LOW (ref 3.5–5.0)
Anion gap: 12 (ref 5–15)
BUN: 21 mg/dL — ABNORMAL HIGH (ref 6–20)
CO2: 18 mmol/L — ABNORMAL LOW (ref 22–32)
Calcium: 9 mg/dL (ref 8.9–10.3)
Chloride: 104 mmol/L (ref 101–111)
Creatinine, Ser: 1.78 mg/dL — ABNORMAL HIGH (ref 0.61–1.24)
GFR calc Af Amer: 41 mL/min — ABNORMAL LOW (ref >60)
GFR calc non Af Amer: 36 mL/min — ABNORMAL LOW (ref >60)
GLUCOSE: 123 mg/dL — AB (ref 65–99)
Potassium: 4.6 mmol/L (ref 3.5–5.1)
Sodium: 134 mmol/L — ABNORMAL LOW (ref 135–145)
Total Bilirubin: 0.4 mg/dL (ref 0.3–1.2)
Total Protein: 6.3 g/dL — ABNORMAL LOW (ref 6.5–8.1)

## 2014-08-28 LAB — CBC
HEMATOCRIT: 38.3 % — AB (ref 39.0–52.0)
Hemoglobin: 12.2 g/dL — ABNORMAL LOW (ref 13.0–17.0)
MCH: 26.5 pg (ref 26.0–34.0)
MCHC: 31.9 g/dL (ref 30.0–36.0)
MCV: 83.1 fL (ref 78.0–100.0)
PLATELETS: 345 10*3/uL (ref 150–400)
RBC: 4.61 MIL/uL (ref 4.22–5.81)
RDW: 17.3 % — ABNORMAL HIGH (ref 11.5–15.5)
WBC: 9 10*3/uL (ref 4.0–10.5)

## 2014-08-28 LAB — BLOOD GAS, ARTERIAL
ACID-BASE DEFICIT: 2.1 mmol/L — AB (ref 0.0–2.0)
Bicarbonate: 20.9 mEq/L (ref 20.0–24.0)
Drawn by: 206361
FIO2: 0.21 %
O2 Saturation: 98 %
PO2 ART: 104 mmHg — AB (ref 80.0–100.0)
Patient temperature: 98.6
TCO2: 21.8 mmol/L (ref 0–100)
pCO2 arterial: 28.2 mmHg — ABNORMAL LOW (ref 35.0–45.0)
pH, Arterial: 7.483 — ABNORMAL HIGH (ref 7.350–7.450)

## 2014-08-28 LAB — ABO/RH: ABO/RH(D): A POS

## 2014-08-28 LAB — URINALYSIS, ROUTINE W REFLEX MICROSCOPIC
GLUCOSE, UA: NEGATIVE mg/dL
Hgb urine dipstick: NEGATIVE
KETONES UR: 15 mg/dL — AB
Leukocytes, UA: NEGATIVE
Nitrite: NEGATIVE
PROTEIN: NEGATIVE mg/dL
Specific Gravity, Urine: 1.022 (ref 1.005–1.030)
UROBILINOGEN UA: 0.2 mg/dL (ref 0.0–1.0)
pH: 5 (ref 5.0–8.0)

## 2014-08-28 LAB — SURGICAL PCR SCREEN
MRSA, PCR: NEGATIVE
Staphylococcus aureus: NEGATIVE

## 2014-08-28 LAB — APTT: APTT: 38 s — AB (ref 24–37)

## 2014-08-28 MED ORDER — CHLORHEXIDINE GLUCONATE 4 % EX LIQD: 30.0000 mL | CUTANEOUS | Status: DC

## 2014-08-28 MED ORDER — PHENYLEPHRINE HCL 10 MG/ML IJ SOLN
30.0000 ug/min | INTRAMUSCULAR | Status: DC
  Filled 2014-08-28: qty 2

## 2014-08-28 MED ORDER — DEXMEDETOMIDINE HCL IN NACL 400 MCG/100ML IV SOLN
0.1000 ug/kg/h | INTRAVENOUS | Status: DC
  Filled 2014-08-28: qty 100

## 2014-08-28 MED ORDER — LEVOFLOXACIN IN D5W 500 MG/100ML IV SOLN
500.0000 mg | INTRAVENOUS | Status: DC
  Filled 2014-08-28 (×2): qty 100

## 2014-08-28 MED ORDER — PLASMA-LYTE 148 IV SOLN
INTRAVENOUS | Status: DC
  Filled 2014-08-28: qty 2.5

## 2014-08-28 MED ORDER — MAGNESIUM SULFATE 50 % IJ SOLN
40.0000 meq | INTRAMUSCULAR | Status: DC
  Filled 2014-08-28 (×2): qty 10

## 2014-08-28 MED ORDER — DOPAMINE-DEXTROSE 3.2-5 MG/ML-% IV SOLN
0.0000 ug/kg/min | INTRAVENOUS | Status: DC
  Filled 2014-08-28: qty 250

## 2014-08-28 MED ORDER — EPINEPHRINE HCL 1 MG/ML IJ SOLN
0.0000 ug/min | INTRAMUSCULAR | Status: DC
  Filled 2014-08-28: qty 4

## 2014-08-28 MED ORDER — NITROGLYCERIN IN D5W 200-5 MCG/ML-% IV SOLN
2.0000 ug/min | INTRAVENOUS | Status: DC
  Filled 2014-08-28: qty 250

## 2014-08-28 MED ORDER — VANCOMYCIN HCL 10 G IV SOLR
1250.0000 mg | INTRAVENOUS | Status: DC
  Filled 2014-08-28 (×2): qty 1250

## 2014-08-28 MED ORDER — POTASSIUM CHLORIDE 2 MEQ/ML IV SOLN
80.0000 meq | INTRAVENOUS | Status: DC
  Filled 2014-08-28: qty 40

## 2014-08-28 MED ORDER — SODIUM CHLORIDE 0.9 % IV SOLN
INTRAVENOUS | Status: DC
  Filled 2014-08-28: qty 40

## 2014-08-28 MED ORDER — SODIUM CHLORIDE 0.9 % IV SOLN
INTRAVENOUS | Status: DC
  Filled 2014-08-28: qty 2.5

## 2014-08-28 MED ORDER — SODIUM CHLORIDE 0.9 % IV SOLN
INTRAVENOUS | Status: DC
  Filled 2014-08-28: qty 30

## 2014-08-28 NOTE — Pre-Procedure Instructions (Signed)
    NASIIR MONTS  08/28/2014      [id number] Lorina Rabon, [place] - [address] [place] [address] Phone: [phone] Fax: [phone]    Your procedure is scheduled on Aug 29 2014.  Report to [facility] Admitting at 5:30 A.M.  Call this number if you have problems the morning of surgery:  [phone]   Remember:  Do not eat food or drink liquids after midnight TONIGHT.  Take these medicines the morning of surgery with A SIP OF WATER amiodarone (PACERONE) , digoxin (LANOXIN), metoprolol tartrate (LOPRESSOR) , pantoprazole  (PROTONIX); pain pill if needed   ELIQUIS AS DIRECTED   Do not wear jewelry.  Do not wear lotions, powders, or colognes.  You may NOT wear deodorant.  Men may shave face and neck.  Do not bring valuables to the hospital.  [facility] is not responsible for any belongings or valuables.  Contacts, dentures or bridgework may not be worn into surgery.  Leave your suitcase in the car.  After surgery it may be brought to your room.  For patients admitted to the hospital, discharge time will be determined by your treatment team.  Patients discharged the day of surgery will not be allowed to drive home.   Name and phone number of your driver:   FAMILY/FRIEND Special instructions:  PREPARING FOR SURGERY'  Please read over the following fact sheets that you were given. Pain Booklet, Coughing and Deep Breathing, Blood Transfusion Information and Surgical Site Infection Prevention

## 2014-08-28 NOTE — Progress Notes (Signed)
Pt and spouse stated instructed by Dr Cyndia Bent to stop eliquis 5/18/206.

## 2014-08-28 NOTE — H&P (Signed)
[address]       [place] [address]             [phone]      Cardiothoracic Surgery History and Physical    DERL ABALOS is an 74 y.o. male.   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 He also has severe TR with no apparent vegetation on that valve and the valve structurally looks good. He has moderate CAD with no significant stenosis in the LAD but a significant antero-apical wall motion abnormality on LV gram. That could be due to embolic disease from the endocarditis. His LV function on the recent echos looks better than the cath. The OM is occluded but small and probably not graftable. The distal RCA has significant disease that could be grafted. He had poor dentition and this was felt to be the likely cause of his endocarditis so he underwent extraction of all of his teeth by Dr. Enrique Sack on 07/20/2014. He completed his antibiotic therapy at home. He has not had a return appt with the ID physician yet. He has also had this right knee pain and had a joint aspiration with only a small amount of fluid that had a negative gram stain and negative culture so far. An MR did show some osteoarthritis and extensive degenerative tearing of the medial meniscus but no specific signs of infection or osteo. He has significant malnutrition and says he has not been able to eat any solid food since dental extraction but his wife is trying to improve his nutrition with Ensure milkshakes and soup. He was just recently back in the hospital a few days ago with shortness of breath after he didn't take his lasix for a day. He had a BNP of 1769 and troponin was mildly elevated at .08, .07. He is still weak. He denies any fever or chills or  sweats.    Past Medical History  Diagnosis Date  . Hypertension   . Myocardial infarction   . CHF (congestive heart failure)   . Shortness of breath dyspnea   . Arthritis     KNEES  . Heart murmur   . Pneumonia     hx 3/15  . GERD (gastroesophageal reflux disease)     occ  . Status post PICC central line placement     placed for vanco infusion rt arm    Past Surgical History  Procedure Laterality Date  . Tee without cardioversion N/A 07/11/2014    Procedure: TRANSESOPHAGEAL ECHOCARDIOGRAM (TEE);  Surgeon: Josue Hector, MD;  Location: [facility] ENDOSCOPY;  Service: Cardiovascular;  Laterality: N/A;  . Tonsillectomy    . Multiple extractions with alveoloplasty N/A 07/20/2014    Procedure: Extraction of tooth #'s 2,6,8,14,21,22,23,27,28,30 with alveoloplasty ;  Surgeon: Lenn Cal, DDS;  Location: [place];  Service: Oral Surgery;  Laterality: N/A;    Family History  Problem Relation Age of Onset  . Coronary artery disease Brother    Social History:  reports that he has never smoked. He has never used smokeless tobacco. He reports that he drinks about 0.6 oz of alcohol per week. He reports that he does not use illicit drugs.  Allergies:  Allergies  Allergen Reactions  . Lipitor [  Atorvastatin] Other (See Comments)    Abdominal pain, nausea and chest pain  . Penicillins     Difficulty breathing    No prescriptions prior to admission    Results for orders placed or performed during the hospital encounter of 08/28/14 (from the past 48 hour(s))  Urinalysis, Routine w reflex microscopic     Status: Abnormal   Collection Time: 08/28/14 12:27 PM  Result Value Ref Range   Color, Urine AMBER (A) YELLOW    Comment: BIOCHEMICALS MAY BE AFFECTED BY COLOR   APPearance CLEAR CLEAR   Specific Gravity, Urine 1.022 1.005 - 1.030   pH 5.0 5.0 - 8.0   Glucose, UA NEGATIVE NEGATIVE mg/dL   Hgb urine dipstick NEGATIVE NEGATIVE   Bilirubin Urine SMALL (A) NEGATIVE   Ketones, ur 15 (A)  NEGATIVE mg/dL   Protein, ur NEGATIVE NEGATIVE mg/dL   Urobilinogen, UA 0.2 0.0 - 1.0 mg/dL   Nitrite NEGATIVE NEGATIVE   Leukocytes, UA NEGATIVE NEGATIVE    Comment: MICROSCOPIC NOT DONE ON URINES WITH NEGATIVE PROTEIN, BLOOD, LEUKOCYTES, NITRITE, OR GLUCOSE <1000 mg/dL.  Surgical pcr screen     Status: None   Collection Time: 08/28/14 12:27 PM  Result Value Ref Range   MRSA, PCR NEGATIVE NEGATIVE   Staphylococcus aureus NEGATIVE NEGATIVE    Comment:        The Xpert SA Assay (FDA approved for NASAL specimens in patients over 15 years of age), is one component of a comprehensive surveillance program.  Test performance has been validated by [facility] for patients greater than or equal to 81 year old. It is not intended to diagnose infection nor to guide or monitor treatment.   APTT     Status: Abnormal   Collection Time: 08/28/14 12:28 PM  Result Value Ref Range   aPTT 38 (H) 24 - 37 seconds    Comment:        IF BASELINE aPTT IS ELEVATED, SUGGEST PATIENT RISK ASSESSMENT BE USED TO DETERMINE APPROPRIATE ANTICOAGULANT THERAPY.   Blood gas, arterial on room air     Status: Abnormal   Collection Time: 08/28/14 12:28 PM  Result Value Ref Range   FIO2 0.21 %   pH, Arterial 7.483 (H) 7.350 - 7.450   pCO2 arterial 28.2 (L) 35.0 - 45.0 mmHg   pO2, Arterial 104 (H) 80.0 - 100.0 mmHg   Bicarbonate 20.9 20.0 - 24.0 mEq/L   TCO2 21.8 0 - 100 mmol/L   Acid-base deficit 2.1 (H) 0.0 - 2.0 mmol/L   O2 Saturation 98.0 %   Patient temperature 98.6    Collection site LEFT RADIAL    Drawn by [id number]    Sample type ARTERIAL DRAW    Allens test (pass/fail) PASS PASS  CBC     Status: Abnormal   Collection Time: 08/28/14 12:28 PM  Result Value Ref Range   WBC 9.0 4.0 - 10.5 K/uL   RBC 4.61 4.22 - 5.81 MIL/uL   Hemoglobin 12.2 (L) 13.0 - 17.0 g/dL   HCT 38.3 (L) 39.0 - 52.0 %   MCV 83.1 78.0 - 100.0 fL   MCH 26.5 26.0 - 34.0 pg   MCHC 31.9 30.0 - 36.0 g/dL   RDW 17.3 (H) 11.5 -  15.5 %   Platelets 345 150 - 400 K/uL  Comprehensive metabolic panel     Status: Abnormal   Collection Time: 08/28/14 12:28 PM  Result Value Ref Range   Sodium 134 (L) 135 - 145 mmol/L  Potassium 4.6 3.5 - 5.1 mmol/L   Chloride 104 101 - 111 mmol/L   CO2 18 (L) 22 - 32 mmol/L   Glucose, Bld 123 (H) 65 - 99 mg/dL   BUN 21 (H) 6 - 20 mg/dL   Creatinine, Ser 1.78 (H) 0.61 - 1.24 mg/dL   Calcium 9.0 8.9 - 10.3 mg/dL   Total Protein 6.3 (L) 6.5 - 8.1 g/dL   Albumin 3.2 (L) 3.5 - 5.0 g/dL   AST 16 15 - 41 U/L   ALT 13 (L) 17 - 63 U/L   Alkaline Phosphatase 86 38 - 126 U/L   Total Bilirubin 0.4 0.3 - 1.2 mg/dL   GFR calc non Af Amer 36 (L) >60 mL/min   GFR calc Af Amer 41 (L) >60 mL/min    Comment: (NOTE) The eGFR has been calculated using the CKD EPI equation. This calculation has not been validated in all clinical situations. eGFR's persistently <60 mL/min signify possible Chronic Kidney Disease.    Anion gap 12 5 - 15  Protime-INR     Status: None   Collection Time: 08/28/14 12:28 PM  Result Value Ref Range   Prothrombin Time 14.5 11.6 - 15.2 seconds   INR 1.11 0.00 - 1.49  Type and screen     Status: None   Collection Time: 08/28/14 12:40 PM  Result Value Ref Range   ABO/RH(D) A POS    Antibody Screen NEG    Sample Expiration 08/31/2014    Dg Chest 2 View  08/28/2014   CLINICAL DATA:  Preoperative exam prior to cardiac surgery 08/29/2014. Severe aortic insufficiency, myocardial infarction, hypertension.  EXAM: CHEST  2 VIEW  COMPARISON:  None.  FINDINGS: Small left pleural effusion with partial loculation noted. This partly obscures the left lung base, with patchy left basilar opacity identified. Hyperinflation suggesting emphysema is noted. Remote left posterior seventh rib fracture noted. Heart size upper limits of normal.  IMPRESSION: Small partly loculated left pleural effusion or thickening obscuring the left lung base, with patchy left basilar opacity. This could  represent scarring or atelectasis but is not further evaluated. Consider chest CT without contrast for further evaluation.  These results will be called to the ordering clinician or representative by the Radiologist Assistant, and communication documented in the PACS or zVision Dashboard.   Electronically Signed   By: Conchita Paris M.D.   On: 08/28/2014 13:07    Review of Systems  Constitutional: Positive for weight loss and malaise/fatigue. Negative for fever, chills and diaphoresis.  HENT: Negative.   Eyes: Negative.   Respiratory: Positive for shortness of breath. Negative for cough, hemoptysis and sputum production.   Cardiovascular: Negative for chest pain, palpitations, orthopnea, leg swelling and PND.  Gastrointestinal: Negative.   Genitourinary: Negative.   Musculoskeletal: Positive for joint pain.  Skin: Negative.   Neurological: Negative.   Endo/Heme/Allergies: Negative.   Psychiatric/Behavioral: Negative.     There were no vitals taken for this visit. Physical Exam  Constitutional: He is oriented to person, place, and time.  Chronically-ill appearing gentleman in no distress  HENT:  Head: Normocephalic and atraumatic.  Mouth/Throat: Oropharynx is clear and moist.  Eyes: EOM are normal. Pupils are equal, round, and reactive to light.  Neck: Normal range of motion. Neck supple. No JVD present. No thyromegaly present.  Cardiovascular: Normal rate, regular rhythm and intact distal pulses.   Murmur heard. III/VI diastolic murmur along RSB to apex  Respiratory: Effort normal and breath sounds normal. No respiratory distress.  He has no rales.  GI: Soft. Bowel sounds are normal. He exhibits no distension and no mass. There is no tenderness.  Musculoskeletal: Normal range of motion. He exhibits no edema or tenderness.  Lymphadenopathy:    He has no cervical adenopathy.  Neurological: He is alert and oriented to person, place, and time. He has normal strength. No cranial nerve  deficit or sensory deficit.  Skin: Skin is warm and dry.  Psychiatric: He has a normal mood and affect.    He has a repeat echo on 08/17/2014 that I have reviewed personally. The report is not in the computer yet. The aortic valve is thickened but not clear vegetation present. There is severe AI. The mitral valve looks ok with no significant MR. The tricuspid valve looks ok with mild TR. LVEF 40-45% with anterior, anterolateral and inferolateral hypokinesis.  Assessment/Plan  He has severe AI following strep viridans endocarditis with NYHA class III heart failure symptoms of shortness of breath and fatigue with minimal activity and a recent admission for an exacerbation of heart failure after stopping lasix for a day. He has completed antibiotic treatment for his endocarditis and I think it is time to proceed with surgery. His nutritional state is not great but I don't think it is going to improve if we wait. I think he will have a slow course and will need physical therapy postop. He has stage III CKD and moderate LV dysfunction. He has a history of PAF and is on Amiodarone and Eliquis. I would not plan to do a MAZE in this chronically ill gentleman who already requires a long surgery but would clip the left atrial appendage as long as there is no clot present on TEE in the OR. I discussed the operative procedure of aortic valve replacement and CABG and clipping of the left atrial appendage with the patient and his wife including alternatives, benefits and risks; including but not limited to bleeding, blood transfusion, infection, stroke, myocardial infarction, graft failure, heart block requiring a permanent pacemaker, organ dysfunction, and death. [name] understands and agrees to proceed.    Plan:  AVR using a tissue valve and CABG, clipping of left atrial appendage on Tuesday 08/29/2014.  Gaye Pollack 08/28/2014, 3:51 PM

## 2014-08-28 NOTE — Progress Notes (Signed)
Anesthesia Chart Review:  Pt is 74 year old male scheduled for aortic valve replacement, CABG on 08/29/2014 with Dr. Cyndia Bent.   Please see Myra Gianotti, PA's note dated 07/19/2014.   Chest x-ray 08/28/2014 reviewed. Small partly loculated left pleural effusion or thickening obscuring the left lung base, with patchy left basilar opacity. This could represent scarring or atelectasis but is not further evaluated. Consider chest CT without contrast for further evaluation.  Preoperative labs reviewed.  PTT 38. Cr 1.78;  prior results ranged from 1.25 to today's high of 1.78).   Notified Ryan in Dr. Vivi Martens office of CXR results and Cr which has been trending upward since 07/19/2014.   Willeen Cass, FNP-BC [facility] Short Stay Surgical Center/Anesthesiology Phone: [phone] 08/28/2014 3:16 PM

## 2014-08-28 NOTE — Progress Notes (Signed)
Pre-op Cardiac Surgery  Carotid Findings:  1-39% ICA stenosis.  Vertebral artery flow is antegrade.   Upper Extremity Right Left  Brachial Pressures 133T 136T  Radial Waveforms T T  Ulnar Waveforms T T  Palmar Arch (Allen's Test) WNL Doppler signal remains normal with radial compression and obliterates with ulnar compression   Findings:      Lower  Extremity Right Left  Dorsalis Pedis    Anterior Tibial    Posterior Tibial    Ankle/Brachial Indices      Findings:

## 2014-08-29 ENCOUNTER — Inpatient Hospital Stay (HOSPITAL_COMMUNITY): Admission: RE | Admit: 2014-08-29 | Payer: Commercial Managed Care - HMO | Source: Ambulatory Visit | Admitting: Surgery

## 2014-08-29 ENCOUNTER — Encounter (HOSPITAL_COMMUNITY): Admission: RE | Payer: Self-pay | Source: Ambulatory Visit

## 2014-08-29 ENCOUNTER — Other Ambulatory Visit: Payer: Self-pay | Admitting: *Deleted

## 2014-08-29 ENCOUNTER — Inpatient Hospital Stay: Payer: Self-pay | Admitting: Internal Medicine

## 2014-08-29 LAB — HEMOGLOBIN A1C
HEMOGLOBIN A1C: 5.2 % (ref 4.8–5.6)
Mean Plasma Glucose: 103 mg/dL

## 2014-08-29 SURGERY — REPLACEMENT, AORTIC VALVE, OPEN
Anesthesia: General | Site: Chest

## 2014-08-30 ENCOUNTER — Ambulatory Visit (HOSPITAL_COMMUNITY): Payer: Commercial Managed Care - HMO | Admitting: Dentistry

## 2014-08-30 MED ORDER — POTASSIUM CHLORIDE 2 MEQ/ML IV SOLN
80.0000 meq | INTRAVENOUS | Status: DC
  Filled 2014-08-30: qty 40

## 2014-08-30 MED ORDER — DEXMEDETOMIDINE HCL IN NACL 400 MCG/100ML IV SOLN
0.1000 ug/kg/h | INTRAVENOUS | Status: AC
  Administered 2014-08-31: .4 ug/kg/h via INTRAVENOUS
  Filled 2014-08-30: qty 100

## 2014-08-30 MED ORDER — NITROGLYCERIN IN D5W 200-5 MCG/ML-% IV SOLN
2.0000 ug/min | INTRAVENOUS | Status: AC
  Administered 2014-08-31: 5 ug/min via INTRAVENOUS
  Filled 2014-08-30: qty 250

## 2014-08-30 MED ORDER — LEVOFLOXACIN IN D5W 500 MG/100ML IV SOLN
500.0000 mg | INTRAVENOUS | Status: AC
  Administered 2014-08-31: 500 mg via INTRAVENOUS
  Filled 2014-08-30: qty 100

## 2014-08-30 MED ORDER — SODIUM CHLORIDE 0.9 % IV SOLN
INTRAVENOUS | Status: DC
  Filled 2014-08-30: qty 30

## 2014-08-30 MED ORDER — EPINEPHRINE HCL 1 MG/ML IJ SOLN
0.0000 ug/min | INTRAVENOUS | Status: DC
  Filled 2014-08-30: qty 4

## 2014-08-30 MED ORDER — SODIUM CHLORIDE 0.9 % IV SOLN
INTRAVENOUS | Status: AC
  Administered 2014-08-31: 13:00:00 via INTRAVENOUS
  Administered 2014-08-31: 69.8 mL/h via INTRAVENOUS
  Filled 2014-08-30: qty 40

## 2014-08-30 MED ORDER — DOPAMINE-DEXTROSE 3.2-5 MG/ML-% IV SOLN
0.0000 ug/kg/min | INTRAVENOUS | Status: AC
  Administered 2014-08-31: 2 ug/kg/min via INTRAVENOUS
  Filled 2014-08-30: qty 250

## 2014-08-30 MED ORDER — SODIUM CHLORIDE 0.9 % IV SOLN
INTRAVENOUS | Status: AC
  Administered 2014-08-31: 1 [IU]/h via INTRAVENOUS
  Filled 2014-08-30: qty 2.5

## 2014-08-30 MED ORDER — PLASMA-LYTE 148 IV SOLN
INTRAVENOUS | Status: AC
  Administered 2014-08-31: 300 mL
  Filled 2014-08-30: qty 2.5

## 2014-08-30 MED ORDER — MAGNESIUM SULFATE 50 % IJ SOLN
40.0000 meq | INTRAMUSCULAR | Status: DC
  Filled 2014-08-30: qty 10

## 2014-08-30 MED ORDER — PHENYLEPHRINE HCL 10 MG/ML IJ SOLN
30.0000 ug/min | INTRAVENOUS | Status: AC
  Administered 2014-08-31: 10 ug/min via INTRAVENOUS
  Filled 2014-08-30: qty 2

## 2014-08-30 MED ORDER — METOPROLOL TARTRATE 12.5 MG HALF TABLET: 12.5000 mg | ORAL_TABLET | ORAL | Status: DC

## 2014-08-30 MED ORDER — VANCOMYCIN HCL 10 G IV SOLR
1250.0000 mg | INTRAVENOUS | Status: AC
  Administered 2014-08-31: 1250 mg via INTRAVENOUS
  Filled 2014-08-30: qty 1250

## 2014-08-30 NOTE — Progress Notes (Signed)
Notified pt of time of arrival for surgery tomorrow 08/31/14. Instructed pt to be here at 5:30 AM. Pt was already aware of this.

## 2014-08-30 NOTE — Patient Outreach (Signed)
[facility]) Care Management  08/30/2014  Joe Kennedy 1940/06/05 [id number]   Referral from [place]. Assigned to Maury Dus, RN.  [name]. [place], [place] Management [place] Assistant Phone: [phone] Fax: [phone]

## 2014-08-31 ENCOUNTER — Encounter (HOSPITAL_COMMUNITY)
Admission: RE | Disposition: A | Discharge status: Home / Self Care | Payer: Commercial Managed Care - HMO | Source: Ambulatory Visit | Attending: Surgery

## 2014-08-31 ENCOUNTER — Encounter (HOSPITAL_COMMUNITY): Payer: Self-pay | Admitting: General Practice

## 2014-08-31 ENCOUNTER — Inpatient Hospital Stay (HOSPITAL_COMMUNITY): Payer: Commercial Managed Care - HMO | Admitting: Certified Registered Nurse Anesthetist

## 2014-08-31 ENCOUNTER — Inpatient Hospital Stay (HOSPITAL_COMMUNITY): Payer: Commercial Managed Care - HMO

## 2014-08-31 ENCOUNTER — Inpatient Hospital Stay (HOSPITAL_COMMUNITY)
Admission: RE | Admit: 2014-08-31 | Discharge: 2014-09-05 | DRG: 219 | Disposition: A | Discharge status: Home / Self Care | Payer: Commercial Managed Care - HMO | Source: Ambulatory Visit | Attending: Surgery | Admitting: Surgery

## 2014-08-31 DIAGNOSIS — I480 Paroxysmal atrial fibrillation: Secondary | ICD-10-CM | POA: Diagnosis present

## 2014-08-31 DIAGNOSIS — E440 Moderate protein-calorie malnutrition: Secondary | ICD-10-CM | POA: Diagnosis present

## 2014-08-31 DIAGNOSIS — I252 Old myocardial infarction: Secondary | ICD-10-CM

## 2014-08-31 DIAGNOSIS — Z952 Presence of prosthetic heart valve: Secondary | ICD-10-CM

## 2014-08-31 DIAGNOSIS — B954 Other streptococcus as the cause of diseases classified elsewhere: Secondary | ICD-10-CM | POA: Diagnosis present

## 2014-08-31 DIAGNOSIS — I214 Non-ST elevation (NSTEMI) myocardial infarction: Secondary | ICD-10-CM

## 2014-08-31 DIAGNOSIS — I4891 Unspecified atrial fibrillation: Secondary | ICD-10-CM

## 2014-08-31 DIAGNOSIS — D62 Acute posthemorrhagic anemia: Secondary | ICD-10-CM | POA: Diagnosis not present

## 2014-08-31 DIAGNOSIS — I330 Acute and subacute infective endocarditis: Secondary | ICD-10-CM | POA: Diagnosis present

## 2014-08-31 DIAGNOSIS — I2510 Atherosclerotic heart disease of native coronary artery without angina pectoris: Secondary | ICD-10-CM | POA: Diagnosis present

## 2014-08-31 DIAGNOSIS — Z8249 Family history of ischemic heart disease and other diseases of the circulatory system: Secondary | ICD-10-CM

## 2014-08-31 DIAGNOSIS — M179 Osteoarthritis of knee, unspecified: Secondary | ICD-10-CM | POA: Diagnosis present

## 2014-08-31 DIAGNOSIS — Z888 Allergy status to other drugs, medicaments and biological substances status: Secondary | ICD-10-CM | POA: Diagnosis not present

## 2014-08-31 DIAGNOSIS — Z6823 Body mass index (BMI) 23.0-23.9, adult: Secondary | ICD-10-CM | POA: Diagnosis not present

## 2014-08-31 DIAGNOSIS — N183 Chronic kidney disease, stage 3 (moderate): Secondary | ICD-10-CM | POA: Diagnosis present

## 2014-08-31 DIAGNOSIS — K219 Gastro-esophageal reflux disease without esophagitis: Secondary | ICD-10-CM | POA: Diagnosis present

## 2014-08-31 DIAGNOSIS — Z951 Presence of aortocoronary bypass graft: Secondary | ICD-10-CM

## 2014-08-31 DIAGNOSIS — I38 Endocarditis, valve unspecified: Secondary | ICD-10-CM

## 2014-08-31 DIAGNOSIS — I5022 Chronic systolic (congestive) heart failure: Secondary | ICD-10-CM

## 2014-08-31 DIAGNOSIS — I129 Hypertensive chronic kidney disease with stage 1 through stage 4 chronic kidney disease, or unspecified chronic kidney disease: Secondary | ICD-10-CM | POA: Diagnosis present

## 2014-08-31 DIAGNOSIS — I351 Nonrheumatic aortic (valve) insufficiency: Secondary | ICD-10-CM | POA: Diagnosis present

## 2014-08-31 DIAGNOSIS — Z880 Allergy status to penicillin: Secondary | ICD-10-CM | POA: Diagnosis not present

## 2014-08-31 DIAGNOSIS — I39 Endocarditis and heart valve disorders in diseases classified elsewhere: Secondary | ICD-10-CM | POA: Diagnosis not present

## 2014-08-31 HISTORY — PX: AORTIC VALVE REPLACEMENT: SHX41

## 2014-08-31 HISTORY — PX: CORONARY ARTERY BYPASS GRAFT: SHX141

## 2014-08-31 HISTORY — PX: TEE WITHOUT CARDIOVERSION: SHX5443

## 2014-08-31 LAB — CBC
HCT: 27.8 % — ABNORMAL LOW (ref 39.0–52.0)
HCT: 28.8 % — ABNORMAL LOW (ref 39.0–52.0)
Hemoglobin: 8.7 g/dL — ABNORMAL LOW (ref 13.0–17.0)
Hemoglobin: 9.2 g/dL — ABNORMAL LOW (ref 13.0–17.0)
MCH: 26.2 pg (ref 26.0–34.0)
MCH: 26.5 pg (ref 26.0–34.0)
MCHC: 31.3 g/dL (ref 30.0–36.0)
MCHC: 31.9 g/dL (ref 30.0–36.0)
MCV: 83.7 fL (ref 78.0–100.0)
MCV: 83 fL (ref 78.0–100.0)
Platelets: 176 10*3/uL (ref 150–400)
Platelets: 185 10*3/uL (ref 150–400)
RBC: 3.32 MIL/uL — AB (ref 4.22–5.81)
RBC: 3.47 MIL/uL — ABNORMAL LOW (ref 4.22–5.81)
RDW: 16.6 % — ABNORMAL HIGH (ref 11.5–15.5)
RDW: 16.5 % — AB (ref 11.5–15.5)
WBC: 11 10*3/uL — ABNORMAL HIGH (ref 4.0–10.5)
WBC: 9 10*3/uL (ref 4.0–10.5)

## 2014-08-31 LAB — POCT I-STAT, CHEM 8
BUN: 20 mg/dL (ref 6–20)
BUN: 20 mg/dL (ref 6–20)
BUN: 18 mg/dL (ref 6–20)
BUN: 17 mg/dL (ref 6–20)
BUN: 19 mg/dL (ref 6–20)
BUN: 18 mg/dL (ref 6–20)
CALCIUM ION: 1.19 mmol/L (ref 1.13–1.30)
CALCIUM ION: 1.08 mmol/L — AB (ref 1.13–1.30)
CHLORIDE: 104 mmol/L (ref 101–111)
CREATININE: 1.3 mg/dL — AB (ref 0.61–1.24)
Calcium, Ion: 1.23 mmol/L (ref 1.13–1.30)
Calcium, Ion: 1.13 mmol/L (ref 1.13–1.30)
Calcium, Ion: 1.12 mmol/L — ABNORMAL LOW (ref 1.13–1.30)
Calcium, Ion: 1.18 mmol/L (ref 1.13–1.30)
Chloride: 105 mmol/L (ref 101–111)
Chloride: 101 mmol/L (ref 101–111)
Chloride: 108 mmol/L (ref 101–111)
Chloride: 104 mmol/L (ref 101–111)
Chloride: 103 mmol/L (ref 101–111)
Creatinine, Ser: 1.5 mg/dL — ABNORMAL HIGH (ref 0.61–1.24)
Creatinine, Ser: 1.4 mg/dL — ABNORMAL HIGH (ref 0.61–1.24)
Creatinine, Ser: 1.3 mg/dL — ABNORMAL HIGH (ref 0.61–1.24)
Creatinine, Ser: 1.4 mg/dL — ABNORMAL HIGH (ref 0.61–1.24)
Creatinine, Ser: 1.3 mg/dL — ABNORMAL HIGH (ref 0.61–1.24)
GLUCOSE: 107 mg/dL — AB (ref 65–99)
GLUCOSE: 136 mg/dL — AB (ref 65–99)
GLUCOSE: 176 mg/dL — AB (ref 65–99)
Glucose, Bld: 106 mg/dL — ABNORMAL HIGH (ref 65–99)
Glucose, Bld: 109 mg/dL — ABNORMAL HIGH (ref 65–99)
Glucose, Bld: 160 mg/dL — ABNORMAL HIGH (ref 65–99)
HCT: 30 % — ABNORMAL LOW (ref 39.0–52.0)
HCT: 25 % — ABNORMAL LOW (ref 39.0–52.0)
HCT: 24 % — ABNORMAL LOW (ref 39.0–52.0)
HEMATOCRIT: 30 % — AB (ref 39.0–52.0)
HEMATOCRIT: 25 % — AB (ref 39.0–52.0)
HEMATOCRIT: 25 % — AB (ref 39.0–52.0)
HEMOGLOBIN: 8.5 g/dL — AB (ref 13.0–17.0)
HEMOGLOBIN: 8.5 g/dL — AB (ref 13.0–17.0)
HEMOGLOBIN: 8.5 g/dL — AB (ref 13.0–17.0)
Hemoglobin: 10.2 g/dL — ABNORMAL LOW (ref 13.0–17.0)
Hemoglobin: 10.2 g/dL — ABNORMAL LOW (ref 13.0–17.0)
Hemoglobin: 8.2 g/dL — ABNORMAL LOW (ref 13.0–17.0)
POTASSIUM: 4.3 mmol/L (ref 3.5–5.1)
POTASSIUM: 4.5 mmol/L (ref 3.5–5.1)
Potassium: 4.1 mmol/L (ref 3.5–5.1)
Potassium: 4.3 mmol/L (ref 3.5–5.1)
Potassium: 5 mmol/L (ref 3.5–5.1)
Potassium: 4.7 mmol/L (ref 3.5–5.1)
Sodium: 135 mmol/L (ref 135–145)
Sodium: 137 mmol/L (ref 135–145)
Sodium: 138 mmol/L (ref 135–145)
Sodium: 136 mmol/L (ref 135–145)
Sodium: 137 mmol/L (ref 135–145)
Sodium: 137 mmol/L (ref 135–145)
TCO2: 25 mmol/L (ref 0–100)
TCO2: 24 mmol/L (ref 0–100)
TCO2: 24 mmol/L (ref 0–100)
TCO2: 24 mmol/L (ref 0–100)
TCO2: 22 mmol/L (ref 0–100)
TCO2: 23 mmol/L (ref 0–100)

## 2014-08-31 LAB — POCT I-STAT 3, ART BLOOD GAS (G3+)
ACID-BASE DEFICIT: 3 mmol/L — AB (ref 0.0–2.0)
Acid-Base Excess: 1 mmol/L (ref 0.0–2.0)
Acid-base deficit: 3 mmol/L — ABNORMAL HIGH (ref 0.0–2.0)
BICARBONATE: 22.7 meq/L (ref 20.0–24.0)
Bicarbonate: 26.2 mEq/L — ABNORMAL HIGH (ref 20.0–24.0)
Bicarbonate: 22.4 mEq/L (ref 20.0–24.0)
O2 SAT: 100 %
O2 SAT: 99 %
O2 Saturation: 99 %
PCO2 ART: 44.7 mmHg (ref 35.0–45.0)
PH ART: 7.376 (ref 7.350–7.450)
PO2 ART: 168 mmHg — AB (ref 80.0–100.0)
Patient temperature: 36.3
Patient temperature: 36.7
TCO2: 28 mmol/L (ref 0–100)
TCO2: 24 mmol/L (ref 0–100)
TCO2: 24 mmol/L (ref 0–100)
pCO2 arterial: 39.7 mmHg (ref 35.0–45.0)
pCO2 arterial: 41.4 mmHg (ref 35.0–45.0)
pH, Arterial: 7.362 (ref 7.350–7.450)
pH, Arterial: 7.34 — ABNORMAL LOW (ref 7.350–7.450)
pO2, Arterial: 411 mmHg — ABNORMAL HIGH (ref 80.0–100.0)
pO2, Arterial: 168 mmHg — ABNORMAL HIGH (ref 80.0–100.0)

## 2014-08-31 LAB — GLUCOSE, CAPILLARY
Glucose-Capillary: 110 mg/dL — ABNORMAL HIGH (ref 65–99)
Glucose-Capillary: 96 mg/dL (ref 65–99)
Glucose-Capillary: 111 mg/dL — ABNORMAL HIGH (ref 65–99)
Glucose-Capillary: 104 mg/dL — ABNORMAL HIGH (ref 65–99)

## 2014-08-31 LAB — PROTIME-INR
INR: 1.35 (ref 0.00–1.49)
Prothrombin Time: 16.8 seconds — ABNORMAL HIGH (ref 11.6–15.2)

## 2014-08-31 LAB — POCT I-STAT 4, (NA,K, GLUC, HGB,HCT)
Glucose, Bld: 119 mg/dL — ABNORMAL HIGH (ref 65–99)
HCT: 30 % — ABNORMAL LOW (ref 39.0–52.0)
HEMOGLOBIN: 10.2 g/dL — AB (ref 13.0–17.0)
Potassium: 4.5 mmol/L (ref 3.5–5.1)
Sodium: 138 mmol/L (ref 135–145)

## 2014-08-31 LAB — PLATELET COUNT: PLATELETS: 195 10*3/uL (ref 150–400)

## 2014-08-31 LAB — CREATININE, SERUM
Creatinine, Ser: 1.43 mg/dL — ABNORMAL HIGH (ref 0.61–1.24)
GFR calc Af Amer: 54 mL/min — ABNORMAL LOW (ref >60)
GFR, EST NON AFRICAN AMERICAN: 46 mL/min — AB (ref >60)

## 2014-08-31 LAB — PREPARE RBC (CROSSMATCH)

## 2014-08-31 LAB — MAGNESIUM: Magnesium: 3.3 mg/dL — ABNORMAL HIGH (ref 1.7–2.4)

## 2014-08-31 LAB — HEMOGLOBIN AND HEMATOCRIT, BLOOD
HEMATOCRIT: 24.7 % — AB (ref 39.0–52.0)
Hemoglobin: 8 g/dL — ABNORMAL LOW (ref 13.0–17.0)

## 2014-08-31 LAB — APTT: APTT: 51 s — AB (ref 24–37)

## 2014-08-31 SURGERY — REPLACEMENT, AORTIC VALVE, OPEN
Anesthesia: General | Site: Chest

## 2014-08-31 MED ORDER — HEMOSTATIC AGENTS (NO CHARGE) OPTIME
TOPICAL | Status: DC | PRN
  Administered 2014-08-31: 1 via TOPICAL

## 2014-08-31 MED ORDER — LACTATED RINGERS IV SOLN: INTRAVENOUS | Status: DC

## 2014-08-31 MED ORDER — MIDAZOLAM HCL 2 MG/2ML IJ SOLN: 2.0000 mg | INTRAMUSCULAR | Status: DC | PRN

## 2014-08-31 MED ORDER — DEXMEDETOMIDINE HCL IN NACL 200 MCG/50ML IV SOLN
0.0000 ug/kg/h | INTRAVENOUS | Status: DC
Start: 2014-08-31 — End: 2014-09-02
  Administered 2014-08-31: 0.7 ug/kg/h via INTRAVENOUS
  Filled 2014-08-31: qty 50

## 2014-08-31 MED ORDER — TRAMADOL HCL 50 MG PO TABS
50.0000 mg | ORAL_TABLET | ORAL | Status: DC | PRN
  Administered 2014-09-02: 100 mg via ORAL
  Filled 2014-08-31: qty 2

## 2014-08-31 MED ORDER — BISACODYL 10 MG RE SUPP: 10.0000 mg | Freq: Every day | RECTAL | Status: DC

## 2014-08-31 MED ORDER — INSULIN REGULAR BOLUS VIA INFUSION
0.0000 [IU] | Freq: Three times a day (TID) | INTRAVENOUS | Status: DC
  Filled 2014-08-31: qty 10

## 2014-08-31 MED ORDER — MORPHINE SULFATE 2 MG/ML IJ SOLN: 1.0000 mg | INTRAMUSCULAR | Status: DC | PRN

## 2014-08-31 MED ORDER — NITROGLYCERIN IN D5W 200-5 MCG/ML-% IV SOLN: 0.0000 ug/min | INTRAVENOUS | Status: DC

## 2014-08-31 MED ORDER — 0.9 % SODIUM CHLORIDE (POUR BTL) OPTIME
TOPICAL | Status: DC | PRN
  Administered 2014-08-31: 2000 mL

## 2014-08-31 MED ORDER — METOPROLOL TARTRATE 1 MG/ML IV SOLN
2.5000 mg | INTRAVENOUS | Status: DC | PRN
Start: 2014-08-31 — End: 2014-09-05
  Filled 2014-08-31: qty 5

## 2014-08-31 MED ORDER — ARTIFICIAL TEARS OP OINT
TOPICAL_OINTMENT | OPHTHALMIC | Status: DC | PRN
  Administered 2014-08-31: 1 via OPHTHALMIC

## 2014-08-31 MED ORDER — ONDANSETRON HCL 4 MG/2ML IJ SOLN
4.0000 mg | Freq: Four times a day (QID) | INTRAMUSCULAR | Status: DC | PRN
  Administered 2014-09-01 – 2014-09-03 (×2): 4 mg via INTRAVENOUS
  Filled 2014-08-31 (×2): qty 2

## 2014-08-31 MED ORDER — LACTATED RINGERS IV SOLN
INTRAVENOUS | Status: DC | PRN
  Administered 2014-08-31 (×6): via INTRAVENOUS

## 2014-08-31 MED ORDER — DOPAMINE-DEXTROSE 3.2-5 MG/ML-% IV SOLN: 0.0000 ug/kg/min | INTRAVENOUS | Status: DC

## 2014-08-31 MED ORDER — MORPHINE SULFATE 2 MG/ML IJ SOLN
2.0000 mg | INTRAMUSCULAR | Status: DC | PRN
  Administered 2014-09-01: 2 mg via INTRAVENOUS
  Filled 2014-08-31: qty 1

## 2014-08-31 MED ORDER — ASPIRIN 81 MG PO CHEW
324.0000 mg | CHEWABLE_TABLET | Freq: Every day | ORAL | Status: DC
  Filled 2014-08-31: qty 4

## 2014-08-31 MED ORDER — ALBUMIN HUMAN 5 % IV SOLN
250.0000 mL | INTRAVENOUS | Status: AC | PRN
  Administered 2014-08-31 (×2): 250 mL via INTRAVENOUS
  Filled 2014-08-31: qty 250

## 2014-08-31 MED ORDER — VANCOMYCIN HCL IN DEXTROSE 1-5 GM/200ML-% IV SOLN
1000.0000 mg | Freq: Once | INTRAVENOUS | Status: AC
  Administered 2014-08-31: 1000 mg via INTRAVENOUS
  Filled 2014-08-31: qty 200

## 2014-08-31 MED ORDER — ACETAMINOPHEN 500 MG PO TABS
1000.0000 mg | ORAL_TABLET | Freq: Four times a day (QID) | ORAL | Status: DC
  Administered 2014-09-01 – 2014-09-05 (×12): 1000 mg via ORAL
  Filled 2014-08-31 (×19): qty 2

## 2014-08-31 MED ORDER — SODIUM CHLORIDE 0.9 % IV SOLN
0.5000 g/h | Freq: Once | INTRAVENOUS | Status: DC
  Filled 2014-08-31: qty 40

## 2014-08-31 MED ORDER — LEVOFLOXACIN IN D5W 750 MG/150ML IV SOLN
750.0000 mg | INTRAVENOUS | Status: AC
  Administered 2014-09-01: 750 mg via INTRAVENOUS
  Filled 2014-08-31: qty 150

## 2014-08-31 MED ORDER — SODIUM CHLORIDE 0.45 % IV SOLN
INTRAVENOUS | Status: DC | PRN
  Administered 2014-08-31: 16:00:00 via INTRAVENOUS

## 2014-08-31 MED ORDER — THROMBIN 20000 UNITS EX SOLR
CUTANEOUS | Status: AC
  Filled 2014-08-31: qty 20000

## 2014-08-31 MED ORDER — SODIUM CHLORIDE 0.9 % IV SOLN: Freq: Once | INTRAVENOUS | Status: DC

## 2014-08-31 MED ORDER — ROCURONIUM BROMIDE 100 MG/10ML IV SOLN
INTRAVENOUS | Status: DC | PRN
  Administered 2014-08-31: 50 mg via INTRAVENOUS

## 2014-08-31 MED ORDER — CETYLPYRIDINIUM CHLORIDE 0.05 % MT LIQD
7.0000 mL | Freq: Four times a day (QID) | OROMUCOSAL | Status: DC
  Administered 2014-08-31 – 2014-09-01 (×2): 7 mL via OROMUCOSAL

## 2014-08-31 MED ORDER — PHENYLEPHRINE HCL 10 MG/ML IJ SOLN
0.0000 ug/min | INTRAVENOUS | Status: DC
  Administered 2014-08-31: 30 ug/min via INTRAVENOUS
  Filled 2014-08-31 (×2): qty 2

## 2014-08-31 MED ORDER — FAMOTIDINE IN NACL 20-0.9 MG/50ML-% IV SOLN
20.0000 mg | Freq: Two times a day (BID) | INTRAVENOUS | Status: DC
  Administered 2014-08-31: 20 mg via INTRAVENOUS

## 2014-08-31 MED ORDER — OXYCODONE HCL 5 MG PO TABS
5.0000 mg | ORAL_TABLET | ORAL | Status: DC | PRN
  Administered 2014-09-01: 10 mg via ORAL
  Filled 2014-08-31: qty 2

## 2014-08-31 MED ORDER — POTASSIUM CHLORIDE 10 MEQ/50ML IV SOLN: 10.0000 meq | INTRAVENOUS | Status: AC

## 2014-08-31 MED ORDER — SODIUM CHLORIDE 0.9 % IJ SOLN
3.0000 mL | Freq: Two times a day (BID) | INTRAMUSCULAR | Status: DC
  Administered 2014-09-01 – 2014-09-05 (×4): 3 mL via INTRAVENOUS

## 2014-08-31 MED ORDER — MAGNESIUM SULFATE 4 GM/100ML IV SOLN
4.0000 g | Freq: Once | INTRAVENOUS | Status: AC
  Administered 2014-08-31: 4 g via INTRAVENOUS
  Filled 2014-08-31: qty 100

## 2014-08-31 MED ORDER — LACTATED RINGERS IV SOLN: 500.0000 mL | Freq: Once | INTRAVENOUS | Status: AC | PRN

## 2014-08-31 MED ORDER — PROTAMINE SULFATE 10 MG/ML IV SOLN
INTRAVENOUS | Status: DC | PRN
Start: 2014-08-31 — End: 2014-08-31
  Administered 2014-08-31: 200 mg via INTRAVENOUS

## 2014-08-31 MED ORDER — BISACODYL 5 MG PO TBEC
10.0000 mg | DELAYED_RELEASE_TABLET | Freq: Every day | ORAL | Status: DC
  Administered 2014-09-01: 10 mg via ORAL
  Filled 2014-08-31: qty 2

## 2014-08-31 MED ORDER — THROMBIN 20000 UNITS EX SOLR
CUTANEOUS | Status: DC | PRN
  Administered 2014-08-31: 20000 [IU] via TOPICAL

## 2014-08-31 MED ORDER — SODIUM CHLORIDE 0.9 % IV SOLN
INTRAVENOUS | Status: DC | PRN
  Administered 2014-08-31: 14:00:00 via INTRAVENOUS

## 2014-08-31 MED ORDER — METOPROLOL TARTRATE 12.5 MG HALF TABLET
12.5000 mg | ORAL_TABLET | Freq: Two times a day (BID) | ORAL | Status: DC
  Administered 2014-09-01 – 2014-09-05 (×6): 12.5 mg via ORAL
  Filled 2014-08-31 (×12): qty 1

## 2014-08-31 MED ORDER — MIDAZOLAM HCL 5 MG/5ML IJ SOLN
INTRAMUSCULAR | Status: DC | PRN
  Administered 2014-08-31: 1 mg via INTRAVENOUS
  Administered 2014-08-31 (×4): 2 mg via INTRAVENOUS
  Administered 2014-08-31: 1 mg via INTRAVENOUS

## 2014-08-31 MED ORDER — ALBUMIN HUMAN 5 % IV SOLN
INTRAVENOUS | Status: DC | PRN
  Administered 2014-08-31: 14:00:00 via INTRAVENOUS

## 2014-08-31 MED ORDER — CALCIUM CHLORIDE 10 % IV SOLN
INTRAVENOUS | Status: DC | PRN
  Administered 2014-08-31: 200 mg via INTRAVENOUS
  Administered 2014-08-31: 400 mg via INTRAVENOUS

## 2014-08-31 MED ORDER — ALBUMIN HUMAN 5 % IV SOLN
12.5000 g | INTRAVENOUS | Status: AC
Start: 2014-08-31 — End: 2014-08-31
  Administered 2014-08-31: 12.5 g via INTRAVENOUS

## 2014-08-31 MED ORDER — LIDOCAINE HCL (CARDIAC) 20 MG/ML IV SOLN
INTRAVENOUS | Status: DC | PRN
  Administered 2014-08-31: 40 mg via INTRAVENOUS

## 2014-08-31 MED ORDER — ACETAMINOPHEN 650 MG RE SUPP
650.0000 mg | Freq: Once | RECTAL | Status: AC
  Administered 2014-08-31: 650 mg via RECTAL

## 2014-08-31 MED ORDER — ACETAMINOPHEN 160 MG/5ML PO SOLN
1000.0000 mg | Freq: Four times a day (QID) | ORAL | Status: DC
  Administered 2014-08-31: 1000 mg
  Filled 2014-08-31: qty 40.6

## 2014-08-31 MED ORDER — SODIUM CHLORIDE 0.9 % IV SOLN
250.0000 mL | INTRAVENOUS | Status: DC
  Administered 2014-09-01: 250 mL via INTRAVENOUS

## 2014-08-31 MED ORDER — SODIUM CHLORIDE 0.9 % IV SOLN
INTRAVENOUS | Status: DC
  Filled 2014-08-31: qty 2.5

## 2014-08-31 MED ORDER — PROPOFOL 10 MG/ML IV BOLUS
INTRAVENOUS | Status: DC | PRN
  Administered 2014-08-31: 50 mg via INTRAVENOUS

## 2014-08-31 MED ORDER — VECURONIUM BROMIDE 10 MG IV SOLR
INTRAVENOUS | Status: DC | PRN
  Administered 2014-08-31: 5 mg via INTRAVENOUS
  Administered 2014-08-31: 2 mg via INTRAVENOUS
  Administered 2014-08-31: 5 mg via INTRAVENOUS

## 2014-08-31 MED ORDER — SODIUM CHLORIDE 0.9 % IV SOLN: INTRAVENOUS | Status: DC

## 2014-08-31 MED ORDER — DOCUSATE SODIUM 100 MG PO CAPS
200.0000 mg | ORAL_CAPSULE | Freq: Every day | ORAL | Status: DC
  Administered 2014-09-01 – 2014-09-03 (×3): 200 mg via ORAL
  Filled 2014-08-31 (×5): qty 2

## 2014-08-31 MED ORDER — HEPARIN SODIUM (PORCINE) 1000 UNIT/ML IJ SOLN
INTRAMUSCULAR | Status: DC | PRN
  Administered 2014-08-31: 29000 [IU] via INTRAVENOUS

## 2014-08-31 MED ORDER — ACETAMINOPHEN 160 MG/5ML PO SOLN: 650.0000 mg | Freq: Once | ORAL | Status: AC

## 2014-08-31 MED ORDER — HEMOSTATIC AGENTS (NO CHARGE) OPTIME
TOPICAL | Status: DC | PRN
  Administered 2014-08-31: 2 via TOPICAL

## 2014-08-31 MED ORDER — PANTOPRAZOLE SODIUM 40 MG PO TBEC
40.0000 mg | DELAYED_RELEASE_TABLET | Freq: Every day | ORAL | Status: DC
  Administered 2014-09-01 – 2014-09-05 (×5): 40 mg via ORAL
  Filled 2014-08-31 (×5): qty 1

## 2014-08-31 MED ORDER — SODIUM CHLORIDE 0.9 % IJ SOLN: 3.0000 mL | INTRAMUSCULAR | Status: DC | PRN

## 2014-08-31 MED ORDER — FENTANYL CITRATE (PF) 100 MCG/2ML IJ SOLN
INTRAMUSCULAR | Status: DC | PRN
  Administered 2014-08-31 (×2): 100 ug via INTRAVENOUS
  Administered 2014-08-31: 50 ug via INTRAVENOUS
  Administered 2014-08-31: 250 ug via INTRAVENOUS
  Administered 2014-08-31: 100 ug via INTRAVENOUS
  Administered 2014-08-31: 150 ug via INTRAVENOUS
  Administered 2014-08-31: 100 ug via INTRAVENOUS
  Administered 2014-08-31: 150 ug via INTRAVENOUS

## 2014-08-31 MED ORDER — CHLORHEXIDINE GLUCONATE 0.12 % MT SOLN
15.0000 mL | Freq: Two times a day (BID) | OROMUCOSAL | Status: DC
  Administered 2014-08-31 – 2014-09-01 (×2): 15 mL via OROMUCOSAL
  Filled 2014-08-31 (×2): qty 15

## 2014-08-31 MED ORDER — ASPIRIN EC 325 MG PO TBEC
325.0000 mg | DELAYED_RELEASE_TABLET | Freq: Every day | ORAL | Status: DC
  Administered 2014-09-01 – 2014-09-05 (×5): 325 mg via ORAL
  Filled 2014-08-31 (×5): qty 1

## 2014-08-31 MED ORDER — METOPROLOL TARTRATE 25 MG/10 ML ORAL SUSPENSION
12.5000 mg | Freq: Two times a day (BID) | ORAL | Status: DC
  Administered 2014-09-03: 12.5 mg
  Filled 2014-08-31 (×11): qty 5

## 2014-08-31 SURGICAL SUPPLY — 112 items
ADAPTER CARDIO PERF ANTE/RETRO (ADAPTER) ×4 IMPLANT
BAG DECANTER FOR FLEXI CONT (MISCELLANEOUS) ×4 IMPLANT
BANDAGE ELASTIC 4 VELCRO ST LF (GAUZE/BANDAGES/DRESSINGS) ×4 IMPLANT
BANDAGE ELASTIC 6 VELCRO ST LF (GAUZE/BANDAGES/DRESSINGS) ×4 IMPLANT
BASKET HEART  (ORDER IN 25'S) (MISCELLANEOUS) ×1
BASKET HEART (ORDER IN 25'S) (MISCELLANEOUS) ×1
BASKET HEART (ORDER IN 25S) (MISCELLANEOUS) ×2 IMPLANT
BLADE STERNUM SYSTEM 6 (BLADE) ×4 IMPLANT
BLADE SURG 15 STRL LF DISP TIS (BLADE) ×2 IMPLANT
BLADE SURG 15 STRL SS (BLADE) ×2
BNDG GAUZE ELAST 4 BULKY (GAUZE/BANDAGES/DRESSINGS) ×4 IMPLANT
CANISTER SUCTION 2500CC (MISCELLANEOUS) ×4 IMPLANT
CANNULA GUNDRY RCSP 15FR (MISCELLANEOUS) ×4 IMPLANT
CATH HEART VENT LEFT (CATHETERS) ×2 IMPLANT
CATH ROBINSON RED A/P 18FR (CATHETERS) ×16 IMPLANT
CATH THORACIC 28FR (CATHETERS) ×4 IMPLANT
CATH THORACIC 36FR (CATHETERS) ×4 IMPLANT
CATH THORACIC 36FR RT ANG (CATHETERS) ×4 IMPLANT
CLIP TI MEDIUM 24 (CLIP) IMPLANT
CLIP TI WIDE RED SMALL 24 (CLIP) ×4 IMPLANT
CONT SPEC 4OZ CLIKSEAL STRL BL (MISCELLANEOUS) ×12 IMPLANT
CONT SPEC STER OR (MISCELLANEOUS) ×4 IMPLANT
COVER SURGICAL LIGHT HANDLE (MISCELLANEOUS) ×4 IMPLANT
CRADLE DONUT ADULT HEAD (MISCELLANEOUS) ×4 IMPLANT
DRAPE CARDIOVASCULAR INCISE (DRAPES) ×2
DRAPE SLUSH/WARMER DISC (DRAPES) ×4 IMPLANT
DRAPE SRG 135X102X78XABS (DRAPES) ×2 IMPLANT
DRSG COVADERM 4X14 (GAUZE/BANDAGES/DRESSINGS) ×4 IMPLANT
ELECT CAUTERY BLADE 6.4 (BLADE) ×4 IMPLANT
ELECT REM PT RETURN 9FT ADLT (ELECTROSURGICAL) ×8
ELECTRODE REM PT RTRN 9FT ADLT (ELECTROSURGICAL) ×4 IMPLANT
GAUZE SPONGE 4X4 12PLY STRL (GAUZE/BANDAGES/DRESSINGS) ×8 IMPLANT
GLOVE BIO SURGEON STRL SZ 6 (GLOVE) IMPLANT
GLOVE BIO SURGEON STRL SZ 6.5 (GLOVE) IMPLANT
GLOVE BIO SURGEON STRL SZ7 (GLOVE) IMPLANT
GLOVE BIO SURGEON STRL SZ7.5 (GLOVE) IMPLANT
GLOVE BIO SURGEONS STRL SZ 6.5 (GLOVE)
GLOVE BIOGEL PI IND STRL 6 (GLOVE) IMPLANT
GLOVE BIOGEL PI IND STRL 6.5 (GLOVE) IMPLANT
GLOVE BIOGEL PI IND STRL 7.0 (GLOVE) IMPLANT
GLOVE BIOGEL PI INDICATOR 6 (GLOVE)
GLOVE BIOGEL PI INDICATOR 6.5 (GLOVE)
GLOVE BIOGEL PI INDICATOR 7.0 (GLOVE)
GLOVE EUDERMIC 7 POWDERFREE (GLOVE) ×8 IMPLANT
GLOVE ORTHO TXT STRL SZ7.5 (GLOVE) IMPLANT
GOWN STRL REUS W/ TWL LRG LVL3 (GOWN DISPOSABLE) ×8 IMPLANT
GOWN STRL REUS W/ TWL XL LVL3 (GOWN DISPOSABLE) ×2 IMPLANT
GOWN STRL REUS W/TWL LRG LVL3 (GOWN DISPOSABLE) ×8
GOWN STRL REUS W/TWL XL LVL3 (GOWN DISPOSABLE) ×2
HEART VENT LT CURVED (MISCELLANEOUS) ×4 IMPLANT
HEMOSTAT POWDER SURGIFOAM 1G (HEMOSTASIS) ×8 IMPLANT
HEMOSTAT SURGICEL 2X14 (HEMOSTASIS) ×4 IMPLANT
INSERT FOGARTY 61MM (MISCELLANEOUS) IMPLANT
INSERT FOGARTY XLG (MISCELLANEOUS) ×4 IMPLANT
KIT BASIN OR (CUSTOM PROCEDURE TRAY) ×4 IMPLANT
KIT CATH CPB BARTLE (MISCELLANEOUS) ×4 IMPLANT
KIT ROOM TURNOVER OR (KITS) ×4 IMPLANT
KIT SUCTION CATH 14FR (SUCTIONS) ×4 IMPLANT
KIT VASOVIEW W/TROCAR VH 2000 (KITS) ×4 IMPLANT
NS IRRIG 1000ML POUR BTL (IV SOLUTION) ×24 IMPLANT
PACK OPEN HEART (CUSTOM PROCEDURE TRAY) ×4 IMPLANT
PAD ARMBOARD 7.5X6 YLW CONV (MISCELLANEOUS) ×8 IMPLANT
PAD ELECT DEFIB RADIOL ZOLL (MISCELLANEOUS) ×4 IMPLANT
PENCIL BUTTON HOLSTER BLD 10FT (ELECTRODE) ×4 IMPLANT
PUNCH AORTIC ROTATE 4.0MM (MISCELLANEOUS) IMPLANT
PUNCH AORTIC ROTATE 4.5MM 8IN (MISCELLANEOUS) ×4 IMPLANT
PUNCH AORTIC ROTATE 5MM 8IN (MISCELLANEOUS) IMPLANT
SPONGE GAUZE 4X4 12PLY STER LF (GAUZE/BANDAGES/DRESSINGS) ×8 IMPLANT
SPONGE INTESTINAL PEANUT (DISPOSABLE) IMPLANT
SPONGE LAP 18X18 X RAY DECT (DISPOSABLE) ×8 IMPLANT
SPONGE LAP 4X18 X RAY DECT (DISPOSABLE) ×4 IMPLANT
SUT BONE WAX W31G (SUTURE) ×4 IMPLANT
SUT ETHIBON 2 0 V 52N 30 (SUTURE) ×8 IMPLANT
SUT MNCRL AB 4-0 PS2 18 (SUTURE) ×4 IMPLANT
SUT PROLENE 3 0 SH DA (SUTURE) IMPLANT
SUT PROLENE 3 0 SH1 36 (SUTURE) ×4 IMPLANT
SUT PROLENE 4 0 RB 1 (SUTURE) ×6
SUT PROLENE 4 0 SH DA (SUTURE) IMPLANT
SUT PROLENE 4-0 RB1 .5 CRCL 36 (SUTURE) ×6 IMPLANT
SUT PROLENE 5 0 C 1 36 (SUTURE) IMPLANT
SUT PROLENE 6 0 C 1 30 (SUTURE) ×8 IMPLANT
SUT PROLENE 7 0 BV 1 (SUTURE) IMPLANT
SUT PROLENE 7 0 BV1 MDA (SUTURE) ×12 IMPLANT
SUT PROLENE 8 0 BV175 6 (SUTURE) ×8 IMPLANT
SUT SILK  1 MH (SUTURE)
SUT SILK 1 MH (SUTURE) IMPLANT
SUT STEEL 6MS V (SUTURE) IMPLANT
SUT STEEL STERNAL CCS#1 18IN (SUTURE) IMPLANT
SUT STEEL SZ 6 DBL 3X14 BALL (SUTURE) IMPLANT
SUT VIC AB 1 CTX 36 (SUTURE) ×4
SUT VIC AB 1 CTX36XBRD ANBCTR (SUTURE) ×4 IMPLANT
SUT VIC AB 2-0 CT1 27 (SUTURE) ×2
SUT VIC AB 2-0 CT1 TAPERPNT 27 (SUTURE) ×2 IMPLANT
SUT VIC AB 2-0 CTX 27 (SUTURE) IMPLANT
SUT VIC AB 3-0 SH 27 (SUTURE)
SUT VIC AB 3-0 SH 27X BRD (SUTURE) IMPLANT
SUT VIC AB 3-0 X1 27 (SUTURE) IMPLANT
SUT VICRYL 4-0 PS2 18IN ABS (SUTURE) IMPLANT
SUTURE E-PAK OPEN HEART (SUTURE) ×4 IMPLANT
SYSTEM SAHARA CHEST DRAIN ATS (WOUND CARE) ×4 IMPLANT
TAPE CLOTH SURG 4X10 WHT LF (GAUZE/BANDAGES/DRESSINGS) ×4 IMPLANT
TOWEL OR 17X24 6PK STRL BLUE (TOWEL DISPOSABLE) ×8 IMPLANT
TOWEL OR 17X26 10 PK STRL BLUE (TOWEL DISPOSABLE) ×8 IMPLANT
TRAY FOLEY IC TEMP SENS 16FR (CATHETERS) ×4 IMPLANT
TUBE CONNECTING 12'X1/4 (SUCTIONS) ×1
TUBE CONNECTING 12X1/4 (SUCTIONS) ×3 IMPLANT
TUBING INSUFFLATION (TUBING) ×4 IMPLANT
UNDERPAD 30X30 INCONTINENT (UNDERPADS AND DIAPERS) ×4 IMPLANT
VALVE MAGNA EASE AORTIC 27MM (Prosthesis & Implant Heart) ×4 IMPLANT
VENT LEFT HEART 12002 (CATHETERS) ×4
WATER STERILE IRR 1000ML POUR (IV SOLUTION) ×8 IMPLANT
YANKAUER SUCT BULB TIP NO VENT (SUCTIONS) ×4 IMPLANT

## 2014-08-31 NOTE — Interval H&P Note (Signed)
History and Physical Interval Note:  08/31/2014 5:48 AM  Joe Kennedy  has presented today for surgery, with the diagnosis of SEVERE AI ENDOCARDITIS SEVERE CAD  The various methods of treatment have been discussed with the patient and family. After consideration of risks, benefits and other options for treatment, the patient has consented to  Procedure(s): AORTIC VALVE REPLACEMENT (AVR) (N/A) CORONARY ARTERY BYPASS GRAFTING (CABG) (N/A) CLIPPING OF ATRIAL APPENDAGE (N/A) TRANSESOPHAGEAL ECHOCARDIOGRAM (TEE) (N/A) as a surgical intervention .  The patient's history has been reviewed, patient examined, no change in status, stable for surgery.  I have reviewed the patient's chart and labs.  Questions were answered to the patient's satisfaction.     Gaye Pollack

## 2014-08-31 NOTE — Progress Notes (Signed)
  Echocardiogram Echocardiogram Transesophageal has been performed.  Con Arganbright FRANCES 08/31/2014, 9:03 AM

## 2014-08-31 NOTE — Transfer of Care (Signed)
Immediate Anesthesia Transfer of Care Note  Patient: Joe Kennedy  Procedure(s) Performed: Procedure(s): AORTIC VALVE REPLACEMENT (AVR) (N/A) CORONARY ARTERY BYPASS GRAFTING (CABG) x six, using left internal mammary artery and right leg greater saphenous vein harvested endoscopically (N/A) TRANSESOPHAGEAL ECHOCARDIOGRAM (TEE) (N/A)  Patient Location: SICU  Anesthesia Type:General  Level of Consciousness: sedated and Patient remains intubated per anesthesia plan  Airway & Oxygen Therapy: Patient remains intubated per anesthesia plan and Patient placed on Ventilator (see vital sign flow sheet for setting)  Post-op Assessment: Report given to RN and Post -op Vital signs reviewed and stable  Post vital signs: Reviewed and stable  Last Vitals:  BP 98/53 PA 27/8 (15) HR 82 SpO2 086%  Complications: No apparent anesthesia complications

## 2014-08-31 NOTE — Progress Notes (Signed)
Patient ID: MAKAYLA CONFER, male   DOB: 1940-09-20, 74 y.o.   MRN: [id number] EVENING ROUNDS NOTE :     [address]       [place] [address]             [phone]                 Day of Surgery Procedure(s) (LRB): AORTIC VALVE REPLACEMENT (AVR) (N/A) CORONARY ARTERY BYPASS GRAFTING (CABG) x six, using left internal mammary artery and right leg greater saphenous vein harvested endoscopically (N/A) TRANSESOPHAGEAL ECHOCARDIOGRAM (TEE) (N/A)  Total Length of Stay:  LOS: 0 days  BP 100/62 mmHg  Pulse 84  Temp(Src) 97.5 F (36.4 C) (Core (Comment))  Resp 18  Ht 6' (1.829 m)  Wt 165 lb 3.2 oz (74.934 kg)  BMI 22.40 kg/m2  SpO2 100%  .Intake/Output      05/26 0701 - 05/27 0700   I.V. (mL/kg) 4878.4 (65.1)   Blood 431   IV Piggyback 900   Total Intake(mL/kg) 6209.4 (82.9)   Urine (mL/kg/hr) 890 (0.9)   Other 3254 (3.3)   Blood 800 (0.8)   Chest Tube 250 (0.3)   Total Output 5194   Net +1015.4         . sodium chloride 20 mL/hr at 08/31/14 1900  . [START ON 09/01/2014] sodium chloride    . sodium chloride 20 mL/hr at 08/31/14 1900  . dexmedetomidine Stopped (08/31/14 1838)  . DOPamine 2 mcg/kg/min (08/31/14 1900)  . insulin (NOVOLIN-R) infusion Stopped (08/31/14 1700)  . lactated ringers    . lactated ringers 20 mL/hr at 08/31/14 1900  . nitroGLYCERIN Stopped (08/31/14 1605)  . phenylephrine (NEO-SYNEPHRINE) Adult infusion 50 mcg/min (08/31/14 1900)     Lab Results  Component Value Date   WBC 9.0 08/31/2014   HGB 9.2* 08/31/2014   HCT 28.8* 08/31/2014   PLT 185 08/31/2014   GLUCOSE 119* 08/31/2014   CHOL 82 07/07/2014   TRIG 103 07/07/2014   HDL 18* 07/07/2014   LDLCALC 43 07/07/2014   ALT 13* 08/28/2014   AST 16 08/28/2014   NA 138 08/31/2014   K 4.5 08/31/2014   CL 103 08/31/2014   CREATININE 1.30* 08/31/2014   BUN 18 08/31/2014   CO2 18* 08/28/2014   TSH 2.343 07/06/2014   INR 1.35 08/31/2014   HGBA1C 5.2 08/28/2014   Remains  intubated Low urine output ci 1.7 Additional volume Not bleeding  Grace Isaac MD  Beeper [phone] Office [phone] 08/31/2014 8:04 PM

## 2014-08-31 NOTE — Progress Notes (Signed)
Dr. Cyndia Bent at beside. He said it is okay that his index has not been higher than 1.7.  Changed pacer to AAI at a rate of 84. Will continue to monitor.

## 2014-08-31 NOTE — Brief Op Note (Signed)
08/31/2014  12:48 PM  PATIENT:  Joe Kennedy  74 y.o. male  PRE-OPERATIVE DIAGNOSIS:  1. SEVERE AI 2. ENDOCARDITIS 3.SEVERE CAD  POST-OPERATIVE DIAGNOSIS:  1. SEVERE AI 2. ENDOCARDITIS 3.SEVERE CAD  PROCEDURE: TRANSESOPHAGEAL ECHOCARDIOGRAM (TEE), MEDIAN STERNOTOMY for AORTIC VALVE REPLACEMENT (AVR) (using a Pericardial Tissue Valve size 27 mm) CORONARY ARTERY BYPASS GRAFTING (CABG) x 6 (LIMA to LAD, SVG to DIAGONAL, SVG SEQUENTIALLY to OM1 and OM2, SVG SEQUENTIALLY to PDA and PBL), using left internal mammary artery and right leg greater saphenous vein harvested endoscopically   SURGEON:  Surgeon(s) and Role:    * Gaye Pollack, MD - Primary  PHYSICIAN ASSISTANT: Lars Pinks PA-C  ANESTHESIA:   general  EBL:  Total I/O In: 1000 [I.V.:1000] Out: 40 [Urine:40]   DRAINS: Chest tubes placed in the mediastinal and pleural spaces   SPECIMEN:  Source of Specimen:  Aortic valve leaftlets with vegetation  DISPOSITION OF SPECIMEN:  Pathoolgy and culture  COUNTS CORRECT:  YES  DICTATION: .Dragon Dictation  PLAN OF CARE: Admit to inpatient   PATIENT DISPOSITION:  ICU - intubated and hemodynamically stable.   Delay start of Pharmacological VTE agent (>24hrs) due to surgical blood loss or risk of bleeding: yes  BASELINE WEIGHT: 74.9 kg  Aortic Valve Etiology   Aortic Insufficiency:  Severe  Aortic Valve Disease:  Yes.  Aortic Stenosis:  No.  Etiology (Choose at least one and up to  5 etiologies):  Endocarditis without root abscess  Aortic Valve  Procedure Performed:  Replacement: Yes.  Bioprosthetic Valve. Implant Model Number:[id number], Size:27, Unique Device Identifier:[id number]  Repair/Reconstruction: No.   Aortic Annular Enlargement: No.

## 2014-08-31 NOTE — H&P (Signed)
[address]       [place] [address]             [phone]      Cardiothoracic Surgery History and Physical  Joe Kennedy is an 74 y.o. male.  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 He also has severe TR with no apparent vegetation on that valve and the valve structurally looks good. He has moderate CAD with no significant stenosis in the LAD but a significant antero-apical wall motion abnormality on LV gram. That could be due to embolic disease from the endocarditis. His LV function on the recent echos looks better than the cath. The OM is occluded but small and probably not graftable. The distal RCA has significant disease that could be grafted. He had poor dentition and this was felt to be the likely cause of his endocarditis so he underwent extraction of all of his teeth by Dr. Enrique Sack on 07/20/2014. He completed his antibiotic therapy at home. He has not had a return appt with the ID physician yet. He has also had this right knee pain and had a joint aspiration with only a small amount of fluid that had a negative gram stain and negative culture so far. An MR did show some osteoarthritis and extensive degenerative tearing of the medial meniscus but no specific signs of infection or osteo. He has significant malnutrition and says he has not been able to eat any solid food since dental extraction but his wife is trying to improve his nutrition with Ensure milkshakes and soup. He was just recently back in the hospital a few days ago with shortness of breath after he didn't take his lasix for a day. He had a BNP of 1769 and troponin was mildly elevated at .08, .07. He is still weak. He denies any fever or chills or  sweats.   Past Medical History  Diagnosis Date  . Hypertension   . Myocardial infarction   . CHF (congestive heart failure)   . Shortness of breath dyspnea   . Arthritis     KNEES  . Heart murmur   . Pneumonia     hx 3/15  . GERD (gastroesophageal reflux disease)     occ  . Status post PICC central line placement     placed for vanco infusion rt arm    Past Surgical History  Procedure Laterality Date  . Tee without cardioversion N/A 07/11/2014    Procedure: TRANSESOPHAGEAL ECHOCARDIOGRAM (TEE); Surgeon: Josue Hector, MD; Location: [facility]; Service: Cardiovascular; Laterality: N/A;  . Tonsillectomy    . Multiple extractions with alveoloplasty N/A 07/20/2014    Procedure: Extraction of tooth #'s 2,6,8,14,21,22,23,27,28,30 with alveoloplasty ; Surgeon: Lenn Cal, DDS; Location: [facility]; Service: Oral Surgery; Laterality: N/A;    Family History  Problem Relation Age of Onset  . Coronary artery disease Brother    Social History:  reports that he has never smoked. He has never used smokeless tobacco. He reports that he drinks about 0.6 oz of alcohol per week. He reports that he does not use illicit drugs.  Allergies:  Allergies  Allergen Reactions  . Lipitor [Atorvastatin] Other (See Comments)    Abdominal pain, nausea and chest pain  .  Penicillins     Difficulty breathing    No prescriptions prior to admission     Lab Results Last 48 Hours    Results for orders placed or performed during the hospital encounter of 08/28/14 (from the past 48 hour(s))  Urinalysis, Routine w reflex microscopic Status: Abnormal   Collection Time: 08/28/14 12:27 PM  Result Value Ref Range   Color, Urine AMBER (A) YELLOW    Comment: BIOCHEMICALS MAY BE AFFECTED BY COLOR   APPearance CLEAR CLEAR   Specific Gravity, Urine 1.022 1.005 - 1.030   pH  5.0 5.0 - 8.0   Glucose, UA NEGATIVE NEGATIVE mg/dL   Hgb urine dipstick NEGATIVE NEGATIVE   Bilirubin Urine SMALL (A) NEGATIVE   Ketones, ur 15 (A) NEGATIVE mg/dL   Protein, ur NEGATIVE NEGATIVE mg/dL   Urobilinogen, UA 0.2 0.0 - 1.0 mg/dL   Nitrite NEGATIVE NEGATIVE   Leukocytes, UA NEGATIVE NEGATIVE    Comment: MICROSCOPIC NOT DONE ON URINES WITH NEGATIVE PROTEIN, BLOOD, LEUKOCYTES, NITRITE, OR GLUCOSE <1000 mg/dL.  Surgical pcr screen Status: None   Collection Time: 08/28/14 12:27 PM  Result Value Ref Range   MRSA, PCR NEGATIVE NEGATIVE   Staphylococcus aureus NEGATIVE NEGATIVE    Comment:   The Xpert SA Assay (FDA approved for NASAL specimens in patients over 28 years of age), is one component of a comprehensive surveillance program. Test performance has been validated by [facility] for patients greater than or equal to 49 year old. It is not intended to diagnose infection nor to guide or monitor treatment.   APTT Status: Abnormal   Collection Time: 08/28/14 12:28 PM  Result Value Ref Range   aPTT 38 (H) 24 - 37 seconds    Comment:   IF BASELINE aPTT IS ELEVATED, SUGGEST PATIENT RISK ASSESSMENT BE USED TO DETERMINE APPROPRIATE ANTICOAGULANT THERAPY.   Blood gas, arterial on room air Status: Abnormal   Collection Time: 08/28/14 12:28 PM  Result Value Ref Range   FIO2 0.21 %   pH, Arterial 7.483 (H) 7.350 - 7.450   pCO2 arterial 28.2 (L) 35.0 - 45.0 mmHg   pO2, Arterial 104 (H) 80.0 - 100.0 mmHg   Bicarbonate 20.9 20.0 - 24.0 mEq/L   TCO2 21.8 0 - 100 mmol/L   Acid-base deficit 2.1 (H) 0.0 - 2.0 mmol/L   O2 Saturation 98.0 %   Patient temperature 98.6    Collection site LEFT RADIAL    Drawn by [id number]    Sample type ARTERIAL DRAW    Allens test (pass/fail) PASS PASS  CBC Status: Abnormal   Collection  Time: 08/28/14 12:28 PM  Result Value Ref Range   WBC 9.0 4.0 - 10.5 K/uL   RBC 4.61 4.22 - 5.81 MIL/uL   Hemoglobin 12.2 (L) 13.0 - 17.0 g/dL   HCT 38.3 (L) 39.0 - 52.0 %   MCV 83.1 78.0 - 100.0 fL   MCH 26.5 26.0 - 34.0 pg   MCHC 31.9 30.0 - 36.0 g/dL   RDW 17.3 (H) 11.5 - 15.5 %   Platelets 345 150 - 400 K/uL  Comprehensive metabolic panel Status: Abnormal   Collection Time: 08/28/14 12:28 PM  Result Value Ref Range   Sodium 134 (L) 135 - 145 mmol/L   Potassium 4.6 3.5 - 5.1 mmol/L   Chloride 104 101 - 111 mmol/L   CO2 18 (L) 22 - 32 mmol/L   Glucose, Bld 123 (H) 65 - 99 mg/dL   BUN 21 (H) 6 - 20  mg/dL   Creatinine, Ser 1.78 (H) 0.61 - 1.24 mg/dL   Calcium 9.0 8.9 - 10.3 mg/dL   Total Protein 6.3 (L) 6.5 - 8.1 g/dL   Albumin 3.2 (L) 3.5 - 5.0 g/dL   AST 16 15 - 41 U/L   ALT 13 (L) 17 - 63 U/L   Alkaline Phosphatase 86 38 - 126 U/L   Total Bilirubin 0.4 0.3 - 1.2 mg/dL   GFR calc non Af Amer 36 (L) >60 mL/min   GFR calc Af Amer 41 (L) >60 mL/min    Comment: (NOTE) The eGFR has been calculated using the CKD EPI equation. This calculation has not been validated in all clinical situations. eGFR's persistently <60 mL/min signify possible Chronic Kidney Disease.    Anion gap 12 5 - 15  Protime-INR Status: None   Collection Time: 08/28/14 12:28 PM  Result Value Ref Range   Prothrombin Time 14.5 11.6 - 15.2 seconds   INR 1.11 0.00 - 1.49  Type and screen Status: None   Collection Time: 08/28/14 12:40 PM  Result Value Ref Range   ABO/RH(D) A POS    Antibody Screen NEG    Sample Expiration 08/31/2014       Imaging Results (Last 48 hours)    Dg Chest 2 View  08/28/2014 CLINICAL DATA: Preoperative exam prior to cardiac surgery 08/29/2014. Severe aortic insufficiency, myocardial infarction, hypertension. EXAM:  CHEST 2 VIEW COMPARISON: None. FINDINGS: Small left pleural effusion with partial loculation noted. This partly obscures the left lung base, with patchy left basilar opacity identified. Hyperinflation suggesting emphysema is noted. Remote left posterior seventh rib fracture noted. Heart size upper limits of normal. IMPRESSION: Small partly loculated left pleural effusion or thickening obscuring the left lung base, with patchy left basilar opacity. This could represent scarring or atelectasis but is not further evaluated. Consider chest CT without contrast for further evaluation. These results will be called to the ordering clinician or representative by the Radiologist Assistant, and communication documented in the PACS or zVision Dashboard. Electronically Signed By: Conchita Paris M.D. On: 08/28/2014 13:07     Review of Systems  Constitutional: Positive for weight loss and malaise/fatigue. Negative for fever, chills and diaphoresis.  HENT: Negative.  Eyes: Negative.  Respiratory: Positive for shortness of breath. Negative for cough, hemoptysis and sputum production.  Cardiovascular: Negative for chest pain, palpitations, orthopnea, leg swelling and PND.  Gastrointestinal: Negative.  Genitourinary: Negative.  Musculoskeletal: Positive for joint pain.  Skin: Negative.  Neurological: Negative.  Endo/Heme/Allergies: Negative.  Psychiatric/Behavioral: Negative.    There were no vitals taken for this visit. Physical Exam  Constitutional: He is oriented to person, place, and time.  Chronically-ill appearing gentleman in no distress  HENT:  Head: Normocephalic and atraumatic.  Mouth/Throat: Oropharynx is clear and moist.  Eyes: EOM are normal. Pupils are equal, round, and reactive to light.  Neck: Normal range of motion. Neck supple. No JVD present. No thyromegaly present.  Cardiovascular: Normal rate, regular rhythm and intact distal pulses.  Murmur heard. III/VI  diastolic murmur along RSB to apex  Respiratory: Effort normal and breath sounds normal. No respiratory distress. He has no rales.  GI: Soft. Bowel sounds are normal. He exhibits no distension and no mass. There is no tenderness.  Musculoskeletal: Normal range of motion. He exhibits no edema or tenderness.  Lymphadenopathy:   He has no cervical adenopathy.  Neurological: He is alert and oriented to person, place, and time. He has normal strength. No cranial nerve deficit  or sensory deficit.  Skin: Skin is warm and dry.  Psychiatric: He has a normal mood and affect.    He has a repeat echo on 08/17/2014 that I have reviewed personally. The report is not in the computer yet. The aortic valve is thickened but not clear vegetation present. There is severe AI. The mitral valve looks ok with no significant MR. The tricuspid valve looks ok with mild TR. LVEF 40-45% with anterior, anterolateral and inferolateral hypokinesis.  Assessment/Plan  He has severe AI following strep viridans endocarditis with NYHA class III heart failure symptoms of shortness of breath and fatigue with minimal activity and a recent admission for an exacerbation of heart failure after stopping lasix for a day. He has completed antibiotic treatment for his endocarditis and I think it is time to proceed with surgery. His nutritional state is not great but I don't think it is going to improve if we wait. I think he will have a slow course and will need physical therapy postop. He has stage III CKD and moderate LV dysfunction. He has a history of PAF and is on Amiodarone and Eliquis. I would not plan to do a MAZE in this chronically ill gentleman who already requires a long surgery but would clip the left atrial appendage as long as there is no clot present on TEE in the OR. I discussed the operative procedure of aortic valve replacement and CABG and clipping of the left atrial appendage with the patient and his wife including  alternatives, benefits and risks; including but not limited to bleeding, blood transfusion, infection, stroke, myocardial infarction, graft failure, heart block requiring a permanent pacemaker, organ dysfunction, and death. [name] understands and agrees to proceed.    Plan:  AVR using a tissue valve and CABG, clipping of left atrial appendage.  Gaye Pollack

## 2014-08-31 NOTE — Anesthesia Preprocedure Evaluation (Signed)
Anesthesia Evaluation  Patient identified by MRN, date of birth, ID band Patient awake    Reviewed: Allergy & Precautions, NPO status , Patient's Chart, lab work & pertinent test results, reviewed documented beta blocker date and time   History of Anesthesia Complications Negative for: history of anesthetic complications  Airway Mallampati: II  TM Distance: >3 FB Neck ROM: Full    Dental  (+) Poor Dentition, Chipped   Pulmonary shortness of breath, neg sleep apnea, pneumonia -, resolved,  breath sounds clear to auscultation        Cardiovascular hypertension, Pt. on medications and Pt. on home beta blockers + CAD, + Past MI and +CHF + Valvular Problems/Murmurs AI Rhythm:Regular Rate:Normal + Diastolic murmurs CHF EF 20%, mod to severe AR and TR   Neuro/Psych negative neurological ROS  negative psych ROS   GI/Hepatic Neg liver ROS, GERD-  Medicated and Controlled,  Endo/Other  negative endocrine ROS  Renal/GU      Musculoskeletal   Abdominal   Peds  Hematology  (+) anemia ,   Anesthesia Other Findings   Reproductive/Obstetrics                             Anesthesia Physical Anesthesia Plan  ASA: III  Anesthesia Plan: General ETT and General   Post-op Pain Management:    Induction: Intravenous  Airway Management Planned: Oral ETT  Additional Equipment: Arterial line, PA Cath, TEE and Ultrasound Guidance Line Placement  Intra-op Plan:   Post-operative Plan: Post-operative intubation/ventilation  Informed Consent: I have reviewed the patients History and Physical, chart, labs and discussed the procedure including the risks, benefits and alternatives for the proposed anesthesia with the patient or authorized representative who has indicated his/her understanding and acceptance.     Plan Discussed with:   Anesthesia Plan Comments:         Anesthesia Quick Evaluation

## 2014-08-31 NOTE — Progress Notes (Signed)
Placed patient back on SIMV full support. Failed VC and NIF mechanics.  VC 450   NIF -12. Will attempt wean at a later time.

## 2014-08-31 NOTE — Anesthesia Procedure Notes (Addendum)
Procedure Name: Intubation Date/Time: 08/31/2014 7:53 AM Performed by: Willeen Cass P Pre-anesthesia Checklist: Patient identified, Timeout performed, Emergency Drugs available, Suction available and Patient being monitored Patient Re-evaluated:Patient Re-evaluated prior to inductionOxygen Delivery Method: Circle system utilized Preoxygenation: Pre-oxygenation with 100% oxygen Intubation Type: IV induction Ventilation: Mask ventilation without difficulty Laryngoscope size: Blind intubation without difficulty by Dr Orene Desanctis. Tube type: Oral Tube size: 8.5 mm Number of attempts: 1 Placement Confirmation: ETT inserted through vocal cords under direct vision,  breath sounds checked- equal and bilateral and positive ETCO2 Secured at: 23 cm Tube secured with: Tape Dental Injury: Teeth and Oropharynx as per pre-operative assessment

## 2014-09-01 ENCOUNTER — Encounter (HOSPITAL_COMMUNITY): Payer: Self-pay | Admitting: Surgery

## 2014-09-01 ENCOUNTER — Inpatient Hospital Stay (HOSPITAL_COMMUNITY): Payer: Commercial Managed Care - HMO

## 2014-09-01 LAB — TYPE AND SCREEN
ABO/RH(D): A POS
ANTIBODY SCREEN: NEGATIVE
UNIT DIVISION: 0
Unit division: 0

## 2014-09-01 LAB — POCT I-STAT 3, ART BLOOD GAS (G3+)
ACID-BASE DEFICIT: 5 mmol/L — AB (ref 0.0–2.0)
ACID-BASE DEFICIT: 4 mmol/L — AB (ref 0.0–2.0)
BICARBONATE: 21.2 meq/L (ref 20.0–24.0)
Bicarbonate: 21.4 mEq/L (ref 20.0–24.0)
O2 Saturation: 99 %
O2 Saturation: 99 %
TCO2: 22 mmol/L (ref 0–100)
TCO2: 23 mmol/L (ref 0–100)
pCO2 arterial: 40.5 mmHg (ref 35.0–45.0)
pCO2 arterial: 37.3 mmHg (ref 35.0–45.0)
pH, Arterial: 7.324 — ABNORMAL LOW (ref 7.350–7.450)
pH, Arterial: 7.364 (ref 7.350–7.450)
pO2, Arterial: 176 mmHg — ABNORMAL HIGH (ref 80.0–100.0)
pO2, Arterial: 149 mmHg — ABNORMAL HIGH (ref 80.0–100.0)

## 2014-09-01 LAB — CREATININE, SERUM
Creatinine, Ser: 1.48 mg/dL — ABNORMAL HIGH (ref 0.61–1.24)
GFR calc Af Amer: 52 mL/min — ABNORMAL LOW (ref >60)
GFR calc non Af Amer: 45 mL/min — ABNORMAL LOW (ref >60)

## 2014-09-01 LAB — CBC
HCT: 26.9 % — ABNORMAL LOW (ref 39.0–52.0)
HCT: 26.6 % — ABNORMAL LOW (ref 39.0–52.0)
HEMOGLOBIN: 8.5 g/dL — AB (ref 13.0–17.0)
Hemoglobin: 8.3 g/dL — ABNORMAL LOW (ref 13.0–17.0)
MCH: 26.5 pg (ref 26.0–34.0)
MCH: 26.5 pg (ref 26.0–34.0)
MCHC: 31.6 g/dL (ref 30.0–36.0)
MCHC: 31.2 g/dL (ref 30.0–36.0)
MCV: 83.8 fL (ref 78.0–100.0)
MCV: 85 fL (ref 78.0–100.0)
Platelets: 152 10*3/uL (ref 150–400)
Platelets: 131 10*3/uL — ABNORMAL LOW (ref 150–400)
RBC: 3.21 MIL/uL — ABNORMAL LOW (ref 4.22–5.81)
RBC: 3.13 MIL/uL — AB (ref 4.22–5.81)
RDW: 16.9 % — ABNORMAL HIGH (ref 11.5–15.5)
RDW: 17.1 % — AB (ref 11.5–15.5)
WBC: 9.8 10*3/uL (ref 4.0–10.5)
WBC: 9.2 10*3/uL (ref 4.0–10.5)

## 2014-09-01 LAB — POCT I-STAT, CHEM 8
BUN: 21 mg/dL — ABNORMAL HIGH (ref 6–20)
CALCIUM ION: 1.24 mmol/L (ref 1.13–1.30)
CHLORIDE: 103 mmol/L (ref 101–111)
Creatinine, Ser: 1.4 mg/dL — ABNORMAL HIGH (ref 0.61–1.24)
GLUCOSE: 119 mg/dL — AB (ref 65–99)
HEMATOCRIT: 27 % — AB (ref 39.0–52.0)
Hemoglobin: 9.2 g/dL — ABNORMAL LOW (ref 13.0–17.0)
Potassium: 4.8 mmol/L (ref 3.5–5.1)
SODIUM: 138 mmol/L (ref 135–145)
TCO2: 20 mmol/L (ref 0–100)

## 2014-09-01 LAB — GLUCOSE, CAPILLARY
GLUCOSE-CAPILLARY: 110 mg/dL — AB (ref 65–99)
GLUCOSE-CAPILLARY: 120 mg/dL — AB (ref 65–99)
Glucose-Capillary: 123 mg/dL — ABNORMAL HIGH (ref 65–99)
Glucose-Capillary: 147 mg/dL — ABNORMAL HIGH (ref 65–99)
Glucose-Capillary: 117 mg/dL — ABNORMAL HIGH (ref 65–99)
Glucose-Capillary: 114 mg/dL — ABNORMAL HIGH (ref 65–99)
Glucose-Capillary: 117 mg/dL — ABNORMAL HIGH (ref 65–99)
Glucose-Capillary: 114 mg/dL — ABNORMAL HIGH (ref 65–99)
Glucose-Capillary: 154 mg/dL — ABNORMAL HIGH (ref 65–99)

## 2014-09-01 LAB — MAGNESIUM
MAGNESIUM: 3 mg/dL — AB (ref 1.7–2.4)
Magnesium: 2.8 mg/dL — ABNORMAL HIGH (ref 1.7–2.4)

## 2014-09-01 LAB — BASIC METABOLIC PANEL
ANION GAP: 7 (ref 5–15)
BUN: 17 mg/dL (ref 6–20)
CALCIUM: 8.2 mg/dL — AB (ref 8.9–10.3)
CO2: 22 mmol/L (ref 22–32)
Chloride: 108 mmol/L (ref 101–111)
Creatinine, Ser: 1.37 mg/dL — ABNORMAL HIGH (ref 0.61–1.24)
GFR calc Af Amer: 57 mL/min — ABNORMAL LOW (ref >60)
GFR, EST NON AFRICAN AMERICAN: 49 mL/min — AB (ref >60)
GLUCOSE: 123 mg/dL — AB (ref 65–99)
Potassium: 4.6 mmol/L (ref 3.5–5.1)
Sodium: 137 mmol/L (ref 135–145)

## 2014-09-01 MED ORDER — METOCLOPRAMIDE HCL 5 MG/ML IJ SOLN
10.0000 mg | Freq: Three times a day (TID) | INTRAMUSCULAR | Status: AC
  Administered 2014-09-01 (×3): 10 mg via INTRAVENOUS
  Filled 2014-09-01 (×3): qty 2

## 2014-09-01 MED ORDER — INSULIN ASPART 100 UNIT/ML ~~LOC~~ SOLN
0.0000 [IU] | SUBCUTANEOUS | Status: DC
  Administered 2014-09-02: 2 [IU] via SUBCUTANEOUS

## 2014-09-01 MED ORDER — CETYLPYRIDINIUM CHLORIDE 0.05 % MT LIQD
7.0000 mL | Freq: Two times a day (BID) | OROMUCOSAL | Status: DC
  Administered 2014-09-01: 7 mL via OROMUCOSAL

## 2014-09-01 MED ORDER — INSULIN ASPART 100 UNIT/ML ~~LOC~~ SOLN
0.0000 [IU] | SUBCUTANEOUS | Status: DC
  Administered 2014-09-01: 2 [IU] via SUBCUTANEOUS

## 2014-09-01 MED ORDER — FUROSEMIDE 10 MG/ML IJ SOLN
40.0000 mg | Freq: Once | INTRAMUSCULAR | Status: AC
  Administered 2014-09-01: 40 mg via INTRAVENOUS
  Filled 2014-09-01: qty 4

## 2014-09-01 MED ORDER — PRO-STAT SUGAR FREE PO LIQD
30.0000 mL | Freq: Two times a day (BID) | ORAL | Status: DC
  Administered 2014-09-03 – 2014-09-05 (×4): 30 mL via ORAL
  Filled 2014-09-01 (×9): qty 30

## 2014-09-01 MED ORDER — SODIUM BICARBONATE 8.4 % IV SOLN
50.0000 meq | Freq: Once | INTRAVENOUS | Status: AC
  Administered 2014-09-01: 50 meq via INTRAVENOUS

## 2014-09-01 MED ORDER — LEVOFLOXACIN 750 MG PO TABS
750.0000 mg | ORAL_TABLET | Freq: Every day | ORAL | Status: AC
  Administered 2014-09-02 – 2014-09-04 (×3): 750 mg via ORAL
  Filled 2014-09-01 (×3): qty 1

## 2014-09-01 MED ORDER — ENSURE ENLIVE PO LIQD
237.0000 mL | Freq: Two times a day (BID) | ORAL | Status: DC
  Administered 2014-09-01 – 2014-09-05 (×5): 237 mL via ORAL

## 2014-09-01 MED ORDER — ENOXAPARIN SODIUM 40 MG/0.4ML ~~LOC~~ SOLN
40.0000 mg | Freq: Every day | SUBCUTANEOUS | Status: DC
  Administered 2014-09-01 – 2014-09-04 (×4): 40 mg via SUBCUTANEOUS
  Filled 2014-09-01 (×6): qty 0.4

## 2014-09-01 MED FILL — Sodium Bicarbonate IV Soln 8.4%: INTRAVENOUS | Qty: 50 | Status: AC

## 2014-09-01 MED FILL — Sodium Chloride IV Soln 0.9%: INTRAVENOUS | Qty: 2000 | Status: AC

## 2014-09-01 MED FILL — Lidocaine HCl IV Inj 20 MG/ML: INTRAVENOUS | Qty: 5 | Status: AC

## 2014-09-01 MED FILL — Heparin Sodium (Porcine) Inj 1000 Unit/ML: INTRAMUSCULAR | Qty: 30 | Status: AC

## 2014-09-01 MED FILL — Electrolyte-R (PH 7.4) Solution: INTRAVENOUS | Qty: 3000 | Status: AC

## 2014-09-01 MED FILL — Mannitol IV Soln 20%: INTRAVENOUS | Qty: 500 | Status: AC

## 2014-09-01 MED FILL — Potassium Chloride Inj 2 mEq/ML: INTRAVENOUS | Qty: 40 | Status: AC

## 2014-09-01 MED FILL — Magnesium Sulfate Inj 50%: INTRAMUSCULAR | Qty: 10 | Status: AC

## 2014-09-01 NOTE — Progress Notes (Signed)
Initial Nutrition Assessment  DOCUMENTATION CODES:  Non-severe (moderate) malnutrition in context of chronic illness  INTERVENTION:  Ensure Enlive (each supplement provides 350kcal and 20 grams of protein), Prostat  NUTRITION DIAGNOSIS:  Malnutrition related to chronic illness as evidenced by moderate depletions of muscle mass, moderate depletion of body fat.  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 S/p aortic valve replacement and CABG on 5/26  Pt has been admitted several times recently with ongoing endocarditis. Pt recently had all his teeth extracted (possibly the cause for the endocarditis), making it even harder to meet his nutritional needs. Pt's wife, present at bedside, states that she mechanically alters foods to help PO, trying everythting she can think off. Provided several ideas for increasing calorie/protein intake. Pt also drinks Ensure at home, will order BID while in the hospital (pt states he cannot drink more that 2). Pt is also agreable to Prostat BID. Weight history shows 7 Lb weight loss in just over a week (4%, significant for time frame). Nutrition Focused Physical Exam showed mild to moderate fat and muscle mass depletion. Will continue to monitor. Labs reviewed:  Glu 117 - 123, Hgb 8.5, Cr 1.37, Ca 8.2  Height:  Ht Readings from Last 1 Encounters:  08/31/14 6' (1.829 m)    Weight:  Wt Readings from Last 1 Encounters:  09/01/14 175 lb 4.3 oz (79.5 kg)    Ideal Body Weight:  81 kg  Wt Readings from Last 10 Encounters:  09/01/14 175 lb 4.3 oz (79.5 kg)  08/28/14 165 lb 3.2 oz (74.934 kg)   08/23/14 182 lb (82.555 kg)  08/21/14 166 lb 12.8 oz (75.66 kg)  08/02/14 182 lb (82.555 kg)  07/27/14 182 lb (82.555 kg)  07/25/14 182 lb 6.4 oz (82.736 kg)  07/20/14 181 lb (82.101 kg)  07/19/14 181 lb (82.101 kg)  07/15/14 181 lb 1.6 oz (82.146 kg)    BMI:  Body mass index is 23.77 kg/(m^2).  Estimated Nutritional Needs:  Kcal:  1900 - 2100  Protein:  100 - 115 g  Fluid:  2.0 L  Skin:  Reviewed, no issues (Surgical incisions)  Diet Order:  Diet full liquid Room service appropriate?: Yes; Fluid consistency:: Thin  EDUCATION NEEDS:  Education needs addressed   Intake/Output Summary (Last 24 hours) at 09/01/14 1414 Last data filed at 09/01/14 1200  Gross per 24 hour  Intake 5634.55 ml  Output   6139 ml  Net -504.45 ml    Last BM:  PTA  Tamula Morrical A. [facility] Dietetic Intern Pager: [phone] 09/01/2014 2:24 PM

## 2014-09-01 NOTE — Progress Notes (Signed)
Patient examined and record reviewed.Hemodynamics stable,labs satisfactory.Patient had stable day.Continue current care. We'll give dose of IV Lasix for low urine output, weight up 10 pounds from preop Joe Kennedy Trigt III 09/01/2014

## 2014-09-01 NOTE — Op Note (Addendum)
CARDIOVASCULAR SURGERY OPERATIVE NOTE  08/31/2014  Surgeon:  Gaye Pollack, MD  First Assistant: Lars Pinks,  PA-C   Preoperative Diagnosis:  Severe multi-vessel coronary artery disease and severe aortic insufficiency secondary to aortic valve endocarditis   Postoperative Diagnosis:  Same   Procedure:  1. Median Sternotomy 2. Extracorporeal circulation 3.   Coronary artery bypass grafting x 6   Left internal mammary graft to the LAD  SVG to diagonal  Sequential SVG to OM1 and OM2  Sequential SVG to PL1 and PL2 4.   Endoscopic vein harvest from the right leg  5.   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 He also has severe TR with no apparent vegetation on that valve and the valve structurally looks good. He has moderate CAD with no significant stenosis in the LAD but a significant antero-apical wall motion abnormality on LV gram. That could be due to embolic disease from the endocarditis. His LV function on the recent echos looks better than the cath. The OM is occluded but small and probably not graftable. The distal RCA has significant disease that could be grafted. He had poor dentition and this was felt to be the likely cause of his endocarditis so he underwent extraction of all of his teeth by Dr. Enrique Sack on 07/20/2014. He completed his antibiotic therapy at home. He has not had a return appt with the ID physician yet. He has also had this right knee pain and had a joint aspiration with only a small amount of fluid that had a negative gram stain and negative culture so far. An MR did show  some osteoarthritis and extensive degenerative tearing of the medial meniscus but no specific signs of infection or osteo. He has significant malnutrition and says he has not been able to eat any solid food since dental extraction but his wife is trying to improve his nutrition with Ensure milkshakes and soup. He was just recently back in the hospital a few days ago with shortness of breath after he didn't take his lasix for a day. He had a BNP of 1769 and troponin was mildly elevated at .08, .07. He is still weak. He denies any fever or chills or sweats.   He has severe AI following strep viridans endocarditis with NYHA class III heart failure symptoms of shortness of breath and fatigue with minimal activity and a recent admission for an exacerbation of heart failure after stopping lasix for a day. He has completed antibiotic treatment for his endocarditis and I think it is time to proceed with surgery. His nutritional state is not great but I don't think it is going to improve if we wait. I think he will have a slow course and will need physical therapy postop. He has stage III CKD and moderate LV dysfunction. He has a history of PAF and is on Amiodarone and Eliquis. I would not plan to do a MAZE in this chronically ill gentleman who already requires a long surgery but would clip the left atrial appendage as long as there is no clot present on TEE in the OR. I discussed the operative procedure of aortic valve replacement and CABG and clipping of the left atrial appendage with the patient and his  wife including alternatives, benefits and risks; including but not limited to bleeding, blood transfusion, infection, stroke, myocardial infarction, graft failure, heart block requiring a permanent pacemaker, organ dysfunction, and death. Joe Kennedy understands and agrees to proceed.    Preparation:  The patient was seen in the preoperative holding area and the correct patient, correct operation were  confirmed with the patient after reviewing the medical record and catheterization. The consent was signed by me. Preoperative antibiotics were given. A pulmonary arterial line and radial arterial line were placed by the anesthesia team. The patient was taken back to the operating room and positioned supine on the operating room table. After being placed under general endotracheal anesthesia by the anesthesia team a foley catheter was placed. The neck, chest, abdomen, and both legs were prepped with betadine soap and solution and draped in the usual sterile manner. A surgical time-out was taken and the correct patient and operative procedure were confirmed with the nursing and anesthesia staff.  TEE: performed by Dr. Rica Koyanagi:  This showed severe AI with some vegetation present on the aortic valve. There was good LV function. There was trivial TR and MR with no sign of vegetation on these valves.  Cardiopulmonary Bypass:  A median sternotomy was performed. The pericardium was opened in the midline. Right ventricular function appeared normal. The ascending aorta was of normal size and had no palpable plaque. There were no contraindications to aortic cannulation or cross-clamping. The patient was fully systemically heparinized and the ACT was maintained > 400 sec. The proximal aortic arch was cannulated with a 20 F aortic cannula for arterial inflow. Venous cannulation was performed via the right atrial appendage using a two-staged venous cannula. An antegrade cardioplegia/vent cannula was inserted into the mid-ascending aorta. Aortic occlusion was performed with a single cross-clamp. Systemic cooling to 28 degrees Centigrade and topical cooling of the heart with iced saline were used. Hyperkalemic retrograde cold blood cardioplegia was used to induce diastolic arrest due to the severe AI and was then given at about 20 minute intervals throughout the period of arrest to maintain myocardial temperature at  or below 10 degrees centigrade. A temperature probe was inserted into the interventricular septum and an insulating pad was placed in the pericardium.   Left internal mammary harvest:  The left side of the sternum was retracted using the Rultract retractor. The left internal mammary artery was harvested as a pedicle graft. All side branches were clipped. It was a medium-sized vessel of good quality with excellent blood flow. It was ligated distally and divided. It was sprayed with topical papaverine solution to prevent vasospasm.   Endoscopic vein harvest:  The right greater saphenous vein was harvested endoscopically through a 2 cm incision medial to the right knee. It was harvested from the upper thigh to below the knee. It was a medium-sized vein of good quality. The side branches were all ligated with 4-0 silk ties.    Coronary arteries:  The coronary arteries were examined.   LAD:  Intramyocardial throughout the proximal and middle portions. Exited distally where it was a moderate sized vessel with mild distal disease. The diagonal had a medial branch that was heavily diseased proximally but graftable distally. The lateral branch was not diseased.  LCX:  OM1 and OM2 were better vessels than they appeared on cath with minimal distal disease  RCA:  The PL1 was small but graftable. The PL2 was slightly larger and graftable with no distal disease.   Grafts:  1. LIMA to the LAD: 1.75 mm. It was sewn end to side using 8-0 prolene continuous suture. 2. SVG to diagonal:  1.6 mm. It was sewn end to side using 7-0 prolene continuous suture. 3. Sequential SVG to OM1:  1.6 mm. It was sewn sequential side to side using 7-0 prolene continuous suture. 4. Sequential SVG to OM2:  1.6 mm. It was sewn sequential end to side using 7-0 prolene continuous suture. 5. Sequential SVG to PL1:   1.6 mm. It was sewn sequential side to side using 7-0 prolene continuous suture. 6. Sequential SVG to PL2:   1.6  mm. It was sewn sequential end to side using 7-0 prolene continuous suture.   The proximal vein graft anastomoses were performed to the mid-ascending aorta using continuous 6-0 prolene suture. Graft markers were placed around the proximal anastomoses.  Aortic Valve Replacement:  A transverse aortotomy was performed 1 cm above the take-off of the right coronary artery. The native valve was tricuspid with vegetation on the non-coronary and right leaflets with erosion of the free edges and prolapse of these leaflets. The annulus did not appear to be involved.  The ostia of the coronary arteries were in normal position and were not obstructed. The native valve leaflets were excised and the leaflets with vegetation were sent for culture and to pathology.  Care was taken to remove all particulate debris. The left ventricle was directly inspected for debris and then irrigated with ice saline solution. The annulus was sized and a size 27 mm [employer] Ease pericardial valve was chosen. The model number was [id number] and the serial number was [id number]. While the valve was being prepared 2-0 Ethibond pledgeted horizontal mattress sutures were placed around the annulus with the pledgets in a sub-annular position. The sutures were placed through the sewing ring and the valve lowered into place. The sutures were tied sequentially. The valve seated nicely and the coronary ostia were not obstructed. The prosthetic valve leaflets moved normally and there was no sub-valvular obstruction. The aortotomy was closed using 4-0 Prolene suture in 2 layers with felt strips to reinforce the closure.  Completion:  The patient was rewarmed to 37 degrees Centigrade. The clamp was removed from the LIMA pedicle and there was rapid warming of the septum and return of ventricular fibrillation. The crossclamp was removed. There was spontaneous return of sinus rhythm. The distal and proximal anastomoses were checked for hemostasis. The  position of the grafts was satisfactory. Two temporary epicardial pacing wires were placed on the right atrium and two on the right ventricle. The patient was weaned from CPB without difficulty on renal dopamine at 2 mcg. Cardiac output was 5 LPM. TEE showed a normally functioning prosthesis with no AI or perivalvular leak. LV function was preserved.  Heparin was fully reversed with protamine and the aortic and venous cannulas removed. Hemostasis was achieved. Mediastinal and left pleural drainage tubes were placed. The sternum was closed with  #6 stainless steel wires. The fascia was closed with continuous # 1 vicryl suture. The subcutaneous tissue was closed with 2-0 vicryl continuous suture. The skin was closed with 3-0 vicryl subcuticular suture. All sponge, needle, and instrument counts were reported correct at the end of the case. Dry sterile dressings were placed over the incisions and around the chest tubes which were connected to pleurevac suction. The patient was then transported to the surgical intensive care unit in critical but stable condition.

## 2014-09-01 NOTE — Progress Notes (Addendum)
1 Day Post-Op Procedure(s) (LRB): AORTIC VALVE REPLACEMENT (AVR) (N/A) CORONARY ARTERY BYPASS GRAFTING (CABG) x six, using left internal mammary artery and right leg greater saphenous vein harvested endoscopically (N/A) TRANSESOPHAGEAL ECHOCARDIOGRAM (TEE) (N/A) Subjective:  Nausea this am. Minimal pain  Objective: Vital signs in last 24 hours: Temp:  [97 F (36.1 C)-98.4 F (36.9 C)] 97 F (36.1 C) (05/27 0700) Pulse Rate:  [80-100] 100 (05/27 0700) Cardiac Rhythm:  [-] Normal sinus rhythm (05/26 2000) Resp:  [12-24] 18 (05/27 0700) BP: (90-121)/(47-63) 110/59 mmHg (05/27 0500) SpO2:  [100 %] 100 % (05/27 0700) FiO2 (%):  [40 %-50 %] 40 % (05/27 0053) Weight:  [79.5 kg (175 lb 4.3 oz)] 79.5 kg (175 lb 4.3 oz) (05/27 0500)  Hemodynamic parameters for last 24 hours: PAP: (20-39)/(5-18) 26/14 mmHg CO:  [3.1 L/min-6.5 L/min] 6.5 L/min CI:  [1.6 L/min/m2-3.3 L/min/m2] 3.3 L/min/m2  Intake/Output from previous day: 05/26 0701 - 05/27 0700 In: 7748.1 [I.V.:5937.1; Blood:431; NG/GT:30; IV [id number]:7253] Out: 6644 [Urine:1530; Emesis/NG output:50; Blood:800; Chest Tube:530] Intake/Output this shift:    General appearance: alert and cooperative Neurologic: intact Heart: regular rate and rhythm, S1, S2 normal, no murmur, click, rub or gallop Lungs: clear to auscultation bilaterally Extremities: edema mild Wound: dressing dry  Lab Results:  Recent Labs  08/31/14 1525 09/01/14 0522  WBC 9.0 9.8  HGB 9.2* 8.5*  HCT 28.8* 26.9*  PLT 185 152   BMET:  Recent Labs  08/31/14 1358 08/31/14 1516 09/01/14 0522  NA 137 138 137  K 4.7 4.5 4.6  CL 103  --  108  CO2  --   --  22  GLUCOSE 160* 119* 123*  BUN 18  --  17  CREATININE 1.30*  --  1.37*  CALCIUM  --   --  8.2*    PT/INR:  Recent Labs  08/31/14 1525  LABPROT 16.8*  INR 1.35   ABG    Component Value Date/Time   PHART 7.364 09/01/2014 0318   HCO3 21.4 09/01/2014 0318   TCO2 23 09/01/2014 0318   ACIDBASEDEF 4.0* 09/01/2014 0318   O2SAT 99.0 09/01/2014 0318   CBG (last 3)   Recent Labs  09/01/14 0031 09/01/14 0115 09/01/14 0317  GLUCAP 110* 114* 120*   CXR: ok  ECG: sinus rhythm, first degree block, LBBB. No acute changes.  Assessment/Plan: S/P Procedure(s) (LRB): AORTIC VALVE REPLACEMENT (AVR) (N/A) CORONARY ARTERY BYPASS GRAFTING (CABG) x six, using left internal mammary artery and right leg greater saphenous vein harvested endoscopically (N/A) TRANSESOPHAGEAL ECHOCARDIOGRAM (TEE) (N/A)  He is hemodynamically stable. Wean off dopamine Stage 3 CKD: baseline creat 1.7 preop. Observe. Hold on diuresis today. Strep viridans endocarditis: He had 6 wks of treatment. Still had vegetation on valve sent for culture. Will continue Levaquin until those cultures are back.  Hx of PAF: Will resume Eliquis at discharge.  Mobilize d/c tubes/lines Continue foley due to patient in ICU and urinary output monitoring See progression orders   LOS: 1 day    Joe Kennedy 09/01/2014

## 2014-09-01 NOTE — Procedures (Signed)
Extubation Procedure Note  Patient Details:   Name: Joe Kennedy DOB: 31-Jan-1941 MRN: [id number]   Airway Documentation:     Evaluation  O2 sats: stable throughout Complications: No apparent complications Patient did tolerate procedure well. Bilateral Breath Sounds: Clear  Suctioning: Airway, Oral  Patient extubated to 4lpm Nasal cannula.  NIF -24  VC 1.2L  Cuff leak noted.  Patient able to vocalize post extubation. Vitals Stable.   Catha Brow 09/01/2014, 2:03 AM

## 2014-09-01 NOTE — Progress Notes (Signed)
Talked with Dr. Cyndia Bent about low urine output. He is okay with this for now. No new orders at this time. Will continue to monitor.  Joe Kennedy

## 2014-09-01 NOTE — Progress Notes (Signed)
[facility]  Patient Status: Active (receiving services up to time of hospitalization)  [facility] is providing the following services: RN  If patient discharges after hours, please call [phone].   Edwinna Areola 09/01/2014, 2:56 PM

## 2014-09-01 NOTE — Consult Note (Signed)
   [facility] CM Inpatient Consult   09/01/2014  Joe Kennedy [birth year] [id number] Referral received and will follow up as appropriate.  For questions, please contact: Natividad Brood, RN BSN [facility] Liaison  [phone] business mobile phone

## 2014-09-01 NOTE — Plan of Care (Signed)
Problem: Phase II - Intermediate Post-Op Goal: Wean to Extubate Outcome: Completed/Met Date Met:  09/01/14 5/27 Goal: Pain controlled with appropriate interventions Outcome: Progressing No IV narcotics needed at this point. Will continue scheduled tylenol and po narcotics.

## 2014-09-02 ENCOUNTER — Inpatient Hospital Stay (HOSPITAL_COMMUNITY): Payer: Commercial Managed Care - HMO

## 2014-09-02 ENCOUNTER — Other Ambulatory Visit: Payer: Self-pay

## 2014-09-02 DIAGNOSIS — E440 Moderate protein-calorie malnutrition: Secondary | ICD-10-CM | POA: Insufficient documentation

## 2014-09-02 LAB — BASIC METABOLIC PANEL
Anion gap: 6 (ref 5–15)
BUN: 23 mg/dL — ABNORMAL HIGH (ref 6–20)
CO2: 25 mmol/L (ref 22–32)
Calcium: 7.9 mg/dL — ABNORMAL LOW (ref 8.9–10.3)
Chloride: 105 mmol/L (ref 101–111)
Creatinine, Ser: 1.44 mg/dL — ABNORMAL HIGH (ref 0.61–1.24)
GFR calc non Af Amer: 46 mL/min — ABNORMAL LOW (ref >60)
GFR, EST AFRICAN AMERICAN: 53 mL/min — AB (ref >60)
Glucose, Bld: 94 mg/dL (ref 65–99)
Potassium: 4.4 mmol/L (ref 3.5–5.1)
Sodium: 136 mmol/L (ref 135–145)

## 2014-09-02 LAB — CBC
HCT: 24.4 % — ABNORMAL LOW (ref 39.0–52.0)
HEMOGLOBIN: 7.7 g/dL — AB (ref 13.0–17.0)
MCH: 26.7 pg (ref 26.0–34.0)
MCHC: 31.6 g/dL (ref 30.0–36.0)
MCV: 84.7 fL (ref 78.0–100.0)
Platelets: 120 10*3/uL — ABNORMAL LOW (ref 150–400)
RBC: 2.88 MIL/uL — ABNORMAL LOW (ref 4.22–5.81)
RDW: 17.2 % — ABNORMAL HIGH (ref 11.5–15.5)
WBC: 9.1 10*3/uL (ref 4.0–10.5)

## 2014-09-02 LAB — GLUCOSE, CAPILLARY
GLUCOSE-CAPILLARY: 88 mg/dL (ref 65–99)
Glucose-Capillary: 134 mg/dL — ABNORMAL HIGH (ref 65–99)
Glucose-Capillary: 94 mg/dL (ref 65–99)
Glucose-Capillary: 99 mg/dL (ref 65–99)

## 2014-09-02 MED ORDER — MOVING RIGHT ALONG BOOK
Freq: Once | Status: AC
  Administered 2014-09-02: 17:00:00
  Filled 2014-09-02: qty 1

## 2014-09-02 MED ORDER — FE FUMARATE-B12-VIT C-FA-IFC PO CAPS
1.0000 | ORAL_CAPSULE | Freq: Two times a day (BID) | ORAL | Status: DC
  Administered 2014-09-02 – 2014-09-05 (×6): 1 via ORAL
  Filled 2014-09-02 (×8): qty 1

## 2014-09-02 MED ORDER — AMIODARONE HCL IN DEXTROSE 360-4.14 MG/200ML-% IV SOLN
INTRAVENOUS | Status: AC
  Administered 2014-09-02: 30 mg/h via INTRAVENOUS
  Filled 2014-09-02: qty 200

## 2014-09-02 MED ORDER — FUROSEMIDE 10 MG/ML IJ SOLN
40.0000 mg | Freq: Once | INTRAMUSCULAR | Status: AC
  Administered 2014-09-02: 40 mg via INTRAVENOUS
  Filled 2014-09-02: qty 4

## 2014-09-02 MED ORDER — METOPROLOL TARTRATE 1 MG/ML IV SOLN
5.0000 mg | Freq: Once | INTRAVENOUS | Status: AC
  Administered 2014-09-02: 5 mg via INTRAVENOUS

## 2014-09-02 MED ORDER — SODIUM CHLORIDE 0.9 % IV SOLN: 250.0000 mL | INTRAVENOUS | Status: DC | PRN

## 2014-09-02 MED ORDER — AMIODARONE HCL IN DEXTROSE 360-4.14 MG/200ML-% IV SOLN
30.0000 mg/h | INTRAVENOUS | Status: DC
  Administered 2014-09-02 – 2014-09-03 (×2): 30 mg/h via INTRAVENOUS
  Filled 2014-09-02 (×3): qty 200

## 2014-09-02 MED ORDER — AMIODARONE HCL 200 MG PO TABS
400.0000 mg | ORAL_TABLET | Freq: Once | ORAL | Status: AC
  Administered 2014-09-02: 400 mg via ORAL
  Filled 2014-09-02: qty 2

## 2014-09-02 MED ORDER — SODIUM CHLORIDE 0.9 % IJ SOLN: 3.0000 mL | INTRAMUSCULAR | Status: DC | PRN

## 2014-09-02 MED ORDER — MAGNESIUM HYDROXIDE 400 MG/5ML PO SUSP: 15.0000 mL | Freq: Every day | ORAL | Status: DC | PRN

## 2014-09-02 MED ORDER — FUROSEMIDE 40 MG PO TABS
40.0000 mg | ORAL_TABLET | Freq: Every day | ORAL | Status: DC
  Administered 2014-09-03 – 2014-09-04 (×2): 40 mg via ORAL
  Filled 2014-09-02 (×3): qty 1

## 2014-09-02 MED ORDER — SODIUM CHLORIDE 0.9 % IJ SOLN
3.0000 mL | Freq: Two times a day (BID) | INTRAMUSCULAR | Status: DC
  Administered 2014-09-02 – 2014-09-05 (×4): 3 mL via INTRAVENOUS

## 2014-09-02 MED ORDER — OXYCODONE HCL 5 MG PO TABS: 5.0000 mg | ORAL_TABLET | Freq: Four times a day (QID) | ORAL | Status: DC | PRN

## 2014-09-02 NOTE — Progress Notes (Signed)
Patient examined and record reviewed.Hemodynamics stable,labs satisfactory.Patient had stable day.Continue current care. Tharon Aquas Trigt III 09/02/2014

## 2014-09-02 NOTE — Progress Notes (Signed)
2 Days Post-Op Procedure(s) (LRB): AORTIC VALVE REPLACEMENT (AVR) (N/A) CORONARY ARTERY BYPASS GRAFTING (CABG) x six, using left internal mammary artery and right leg greater saphenous vein harvested endoscopically (N/A) TRANSESOPHAGEAL ECHOCARDIOGRAM (TEE) (N/A) Subjective: Sinus rhythm Able to walk 2 laps in the hallway Postoperative anemia-we'll start oral iron Postop chest x-ray clear Intraoperative cultures of valve negative so far-continue Levaquin for 72 hours Ready for transfer to stepdown  Objective: Vital signs in last 24 hours: Temp:  [97.5 F (36.4 C)-98 F (36.7 C)] 97.5 F (36.4 C) (05/28 0700) Pulse Rate:  [63-78] 71 (05/28 1100) Cardiac Rhythm:  [-] Normal sinus rhythm (05/28 0800) Resp:  [14-23] 21 (05/28 1100) BP: (94-140)/(57-78) 137/69 mmHg (05/28 1100) SpO2:  [97 %-100 %] 100 % (05/28 1100) Weight:  [171 lb 15.3 oz (78 kg)] 171 lb 15.3 oz (78 kg) (05/28 0600)  Hemodynamic parameters for last 24 hours:   stable  Intake/Output from previous day: 05/27 0701 - 05/28 0700 In: 1936.8 [P.O.:1560; I.V.:376.8] Out: 1320 [Urine:1160; Chest Tube:160] Intake/Output this shift: Total I/O In: 160 [P.O.:120; I.V.:40] Out: 90 [Urine:90]  Alert responsive appropriate Lungs clear Heart sounds normal  Lab Results:  Recent Labs  09/01/14 1630 09/02/14 0500  WBC 9.2 9.1  HGB 8.3*  9.2* 7.7*  HCT 26.6*  27.0* 24.4*  PLT 131* 120*   BMET:  Recent Labs  09/01/14 0522 09/01/14 1630 09/02/14 0500  NA 137 138 136  K 4.6 4.8 4.4  CL 108 103 105  CO2 22  --  25  GLUCOSE 123* 119* 94  BUN 17 21* 23*  CREATININE 1.37* 1.48*  1.40* 1.44*  CALCIUM 8.2*  --  7.9*    PT/INR:  Recent Labs  08/31/14 1525  LABPROT 16.8*  INR 1.35   ABG    Component Value Date/Time   PHART 7.364 09/01/2014 0318   HCO3 21.4 09/01/2014 0318   TCO2 20 09/01/2014 1630   ACIDBASEDEF 4.0* 09/01/2014 0318   O2SAT 99.0 09/01/2014 0318   CBG (last 3)   Recent Labs  09/02/14 0020 09/02/14 0445 09/02/14 0758  GLUCAP 134* 99 94    Assessment/Plan: S/P Procedure(s) (LRB): AORTIC VALVE REPLACEMENT (AVR) (N/A) CORONARY ARTERY BYPASS GRAFTING (CABG) x six, using left internal mammary artery and right leg greater saphenous vein harvested endoscopically (N/A) TRANSESOPHAGEAL ECHOCARDIOGRAM (TEE) (N/A) Mobilize Diuresis Plan for transfer to step-down: see transfer orders Stop sliding scale insulin  LOS: 2 days    Tharon Aquas Trigt III 09/02/2014

## 2014-09-02 NOTE — Progress Notes (Signed)
Pt went into A fib with HR in 90s-low 100s, occasionally HR reaches 1teens, BP 130s/70s, O2 sat 98% on RA. 12 lead EKG done. MD notified. Orders received for Lopressor IV x1, Amiodarone gtt, and Amiodarone PO x1. Will give and continue to monitor.  Vella Raring, RN

## 2014-09-02 NOTE — Op Note (Signed)
NAMEWALDON, Joe Kennedy NO.:  [id number]  MEDICAL RECORD NO.:  [id number]  LOCATION:  2S14C                        FACILITY:  [facility]  PHYSICIAN:  Ala Dach, M.D.DATE OF BIRTH:  1940-05-30  DATE OF PROCEDURE:  08/31/2014 DATE OF DISCHARGE:                              OPERATIVE REPORT   INTRAOPERATIVE TRANSESOPHAGEAL ECHOCARDIOGRAPHIC REPORT  INDICATIONS FOR PROCEDURE:  [name] is a 74 year old gentleman and  patient of Dr. Gilford Raid who presents today for aortic valve replacement and coronary artery bypass grafting with possible left atrial appendage clipping.  He is brought to the holding area the morning of surgery where under local anesthesia with sedation, pulmonary artery and radial arterial lines were placed.  He was then taken to the OR for routine induction of general anesthesia, after which the TEE probe was prepared and passed oropharyngeal into the stomach, then slightly withdrawn for imaging of the cardiac structures.  PRE-CARDIOPULMONARY BYPASS EXAMINATION:  Left ventricle:  Left ventricular chamber is seen initially in the short axis view.  There is a dilated left ventricular chamber appreciated.  Overall, contractility appears satisfactory.  Overall, ejection fraction is in the range of 45- 50%.  All segmental wall areas are thickening and moving inward during systolic contraction.  Papillary muscles are well outlined and there are no other masses appreciated.  Mitral valve:  The mitral valve is viewed in the four-chamber view.  The valve itself is thin, compliant, mobile.  There is satisfactory coaptation just below the level of the annular area.  On color Doppler, there is trivial mitral regurgitant flow appreciated.  Aortic valve:  Aortic valve was seen initially in the short axis view. There is aortic sclerosis appreciated.  The valve itself is trileaflet. It does appear to be on the aortic root side of the leaflets  themselves, some calcium or fibrin deposits that artifactually small pea like structures that are in fact mobile.  The edges of the leaflets of the right and noncoronary cusps are degenerative in their appearance.  The ascending aortic area is visualized as well.  No other significant masses are noted there.  On the short axis view, we do see that there is some degree of lack of coaptation centrally.  This then is associated on color Doppler with central aortic jet in the LVOT.  The long axis view again shows an AR jet, has approximately 50% of the left ventricular outflow tract in the diameter.  The jet is turbulent and indicative of aortic regurgitant flow of moderate to moderate to severe degree.  Right ventricle:  Right ventricular chamber is seen in the four-chamber view.  It is a slightly dilated chamber itself.  Contractility appears satisfactory.  Tricuspid valve:  The tricuspid valve appears slightly abnormal and elongated with some degree of prolapse above the level of the anulus. The leaflets themselves only mildly thickened.  There is mild to moderate tricuspid regurgitant valve noted somewhat eccentrically.  The overall width in the annular area is approximately 4.7-4.8 cm the annular width of the tricuspid valve level.  Right atrium:  Right atrial shows normal chamber in appearance and function.  Left atrial chamber is also noted.  The  left atrial appendage is well visualized.  No masses are appreciated within.  The patient was placed on cardiopulmonary bypass.  Hypothermia was begun.  PROCEDURE:  Coronary artery bypass grafting x3 with also an aortic valve replacement with a #27 pericardial tissue valve.  POST-CARDIOPULMONARY BYPASS TEE EXAMINATION (LIMITED EXAM):  In the early post bypass, the left ventricular chamber is moderately depressed in its function.  It is V-paced.  There is some flattening and dyssynergy of the septum that is noted.  With time and  separation from cardiopulmonary bypass, the function of the left ventricular chamber did indeed improved.  The anterior and anterolateral walls were contractile. The septal inferior wall was somewhat hypokinetic.  There was some septal bounce noted.  Mitral valve:  No change noted there.  Aortic valve:  In place of the diseased aortic valve can now be seen a trileaflet segments of the new pericardial tissue valve.  The structures are well seen.  The valve is well placed and well outlined in the images.  It appears to be seated well.  The leaflets open without any problem and close appropriately.  There is neither aortic stenosis nor any aortic insufficiency or leaks noted with this newly replaced valve.  In the area of the tricuspid valve, we could see for a short period time in the early post bypass, some increase in the tricuspid regurgitant flow.  Again this jet was somewhat medially oriented.  With time and separation, this did improve, but will still extend at the end of the surgery itself.  The patient was then taken to the coronary intensive care unit in stable condition.          ______________________________ Ala Dach, M.D.     JTM/MEDQ  D:  09/01/2014  T:  09/02/2014  Job:  [id number]

## 2014-09-02 NOTE — Progress Notes (Signed)
PHASE I  Cardiac Rehab  Pt in SICU awaiting step down bed.  Pt did not ambulate with cardiac rehab staff. Cardiac rehab education completed including sternal precautions,  mobility and activity progression, when to call MD and pulmonary hygeine reinforced IS use.  Pt and wife instructed how to watch [facility] discharge video.  Pt and wife verbalized understanding of education.

## 2014-09-03 ENCOUNTER — Inpatient Hospital Stay (HOSPITAL_COMMUNITY): Payer: Commercial Managed Care - HMO

## 2014-09-03 LAB — CBC
HCT: 24.2 % — ABNORMAL LOW (ref 39.0–52.0)
Hemoglobin: 7.6 g/dL — ABNORMAL LOW (ref 13.0–17.0)
MCH: 26.5 pg (ref 26.0–34.0)
MCHC: 31.4 g/dL (ref 30.0–36.0)
MCV: 84.3 fL (ref 78.0–100.0)
Platelets: 129 10*3/uL — ABNORMAL LOW (ref 150–400)
RBC: 2.87 MIL/uL — ABNORMAL LOW (ref 4.22–5.81)
RDW: 17.3 % — ABNORMAL HIGH (ref 11.5–15.5)
WBC: 8.4 10*3/uL (ref 4.0–10.5)

## 2014-09-03 LAB — BASIC METABOLIC PANEL
Anion gap: 7 (ref 5–15)
BUN: 26 mg/dL — ABNORMAL HIGH (ref 6–20)
CO2: 27 mmol/L (ref 22–32)
Calcium: 8.3 mg/dL — ABNORMAL LOW (ref 8.9–10.3)
Chloride: 103 mmol/L (ref 101–111)
Creatinine, Ser: 1.43 mg/dL — ABNORMAL HIGH (ref 0.61–1.24)
GFR calc Af Amer: 54 mL/min — ABNORMAL LOW (ref >60)
GFR calc non Af Amer: 46 mL/min — ABNORMAL LOW (ref >60)
Glucose, Bld: 91 mg/dL (ref 65–99)
Potassium: 4.4 mmol/L (ref 3.5–5.1)
Sodium: 137 mmol/L (ref 135–145)

## 2014-09-03 MED ORDER — DEXTROSE-NACL 5-0.45 % IV SOLN: INTRAVENOUS | Status: DC

## 2014-09-03 MED ORDER — DIGOXIN 125 MCG PO TABS
0.1250 mg | ORAL_TABLET | Freq: Every day | ORAL | Status: DC
  Administered 2014-09-03 – 2014-09-05 (×3): 0.125 mg via ORAL
  Filled 2014-09-03 (×4): qty 1

## 2014-09-03 MED ORDER — SODIUM CHLORIDE 0.9 % IV SOLN: 250.0000 mL | INTRAVENOUS | Status: DC | PRN

## 2014-09-03 MED ORDER — DEXTROSE-NACL 5-0.9 % IV SOLN
INTRAVENOUS | Status: DC
  Administered 2014-09-03: 15:00:00 via INTRAVENOUS

## 2014-09-03 MED ORDER — ENOXAPARIN SODIUM 40 MG/0.4ML ~~LOC~~ SOLN
40.0000 mg | SUBCUTANEOUS | Status: DC
  Administered 2014-09-03: 40 mg via SUBCUTANEOUS
  Filled 2014-09-03: qty 0.4

## 2014-09-03 NOTE — Evaluation (Signed)
Clinical/Bedside Swallow Evaluation Patient Details  Name: Joe Kennedy MRN: [id number] Date of Birth: 05-24-1940  Today's Date: 09/03/2014 Time: SLP Start Time (ACUTE ONLY): 1410 SLP Stop Time (ACUTE ONLY): 1430 SLP Time Calculation (min) (ACUTE ONLY): 20 min  Past Medical History:  Past Medical History  Diagnosis Date  . Hypertension   . Myocardial infarction   . CHF (congestive heart failure)   . Shortness of breath dyspnea   . Arthritis     KNEES  . Heart murmur   . Pneumonia     hx 3/15  . GERD (gastroesophageal reflux disease)     occ  . Status post PICC central line placement     placed for vanco infusion rt arm   Past Surgical History:  Past Surgical History  Procedure Laterality Date  . Tee without cardioversion N/A 07/11/2014    Procedure: TRANSESOPHAGEAL ECHOCARDIOGRAM (TEE);  Surgeon: Josue Hector, MD;  Location: [facility] ENDOSCOPY;  Service: Cardiovascular;  Laterality: N/A;  . Tonsillectomy    . Multiple extractions with alveoloplasty N/A 07/20/2014    Procedure: Extraction of tooth #'s 2,6,8,14,21,22,23,27,28,30 with alveoloplasty ;  Surgeon: Lenn Cal, DDS;  Location: [place];  Service: Oral Surgery;  Laterality: N/A;  . Aortic valve replacement N/A 08/31/2014    Procedure: AORTIC VALVE REPLACEMENT (AVR);  Surgeon: Gaye Pollack, MD;  Location: [place];  Service: Open Heart Surgery;  Laterality: N/A;  . Coronary artery bypass graft N/A 08/31/2014    Procedure: CORONARY ARTERY BYPASS GRAFTING (CABG) x six, using left internal mammary artery and right leg greater saphenous vein harvested endoscopically;  Surgeon: Gaye Pollack, MD;  Location: [place];  Service: Open Heart Surgery;  Laterality: N/A;  . Tee without cardioversion N/A 08/31/2014    Procedure: TRANSESOPHAGEAL ECHOCARDIOGRAM (TEE);  Surgeon: Gaye Pollack, MD;  Location: [place];  Service: Open Heart Surgery;  Laterality: N/A;   HPI:  Pt is a 74 y.o. male with strep viridans aortic valve endocarditis with  severe AI presenting with a several month history of chills, poor appetite and weight loss. Recently had all teeth extracted in 07/2014. He has significant malnutrition and says he has not been able to eat any solid food since dental extraction but his wife is trying to improve his nutrition with Ensure milkshakes and soup. He was just recently back in the hospital a few days ago with shortness of breath after he didn't take his lasix for a day. CXR 5/29 revealed L greater than R lung base atelectasis and small L/ minimal R pleural effusions. Had PNA in 2015 and hx of GERD. Pt was choking on food at lunch today and HR went down to the 20s; at this time pt reported also having problems eating this a.m. Worth noting that pt was briefly intubated on 5/26 following procedure for aortic valve replacement. Bedside swallow eval ordered to assess swallow function after choking incident earlier today.   Assessment / Plan / Recommendation Clinical Impression  Pt demonstrated no overt s/s of aspiration during evaluation- swallow appeared timely with adequate hyolaryngeal excursion. Pt described choking incident this a.m. with rice. Reports reduced appetite since having teeth extracted in April- plans to get dentures soon. Pt reported globus sensation with peach; followed up with thin liquid which pt reported "helped a bit." Recommend initiating dysphagia 2 diet, thin liquids, meds whole with liquid (one at a time), intermittent supervision to cue small bites/ sips and to alternate food with liquid. SLP will sign off  at this time as there are no acute needs- difficulties appear to be related to dentition. Please re-consult if needs arise.    Aspiration Risk  Mild    Diet Recommendation Dysphagia 2 (Fine chop);Thin   Medication Administration: Whole meds with liquid Compensations: Slow rate;Small sips/bites    Other  Recommendations Oral Care Recommendations: Oral care BID Other Recommendations: Clarify dietary  restrictions   Follow Up Recommendations       Frequency and Duration        Pertinent Vitals/Pain None stated    SLP Swallow Goals     Swallow Study Prior Functional Status       General Other Pertinent Information: Pt is a 74 y.o. male with strep viridans aortic valve endocarditis with severe AI presenting with a several month history of chills, poor appetite and weight loss. Recently had all teeth extracted in 07/2014. He has significant malnutrition and says he has not been able to eat any solid food since dental extraction but his wife is trying to improve his nutrition with Ensure milkshakes and soup. He was just recently back in the hospital a few days ago with shortness of breath after he didn't take his lasix for a day. CXR 5/29 revealed L greater than R lung base atelectasis and small L/ minimal R pleural effusions. Had PNA in 2015 and hx of GERD. Pt was choking on food at lunch today and HR went down to the 20s; at this time pt reported also having problems eating this a.m. Worth noting that pt was briefly intubated on 5/26 following procedure for aortic valve replacement. Bedside swallow eval ordered to assess swallow function after choking incident earlier today. Type of Study: Bedside swallow evaluation Diet Prior to this Study: NPO Temperature Spikes Noted: No Respiratory Status: Room air History of Recent Intubation: Yes Length of Intubations (days): 1 days Date extubated: 08/31/14 (Pt intubated for surgical procedure only) Behavior/Cognition: Alert;Cooperative;Pleasant mood Oral Cavity - Dentition: Edentulous Self-Feeding Abilities: Able to feed self Patient Positioning: Upright in bed Baseline Vocal Quality: Low vocal intensity Volitional Cough: Strong Volitional Swallow: Able to elicit    Oral/Motor/Sensory Function Overall Oral Motor/Sensory Function: Appears within functional limits for tasks assessed Labial ROM: Within Functional Limits Labial Symmetry: Within  Functional Limits Labial Strength: Within Functional Limits Labial Sensation: Within Functional Limits Lingual ROM: Within Functional Limits Lingual Symmetry: Within Functional Limits Lingual Strength: Within Functional Limits Lingual Sensation: Within Functional Limits   Ice Chips Ice chips: Not tested   Thin Liquid Thin Liquid: Within functional limits Presentation: Cup;Straw    Nectar Thick Nectar Thick Liquid: Not tested   Honey Thick Honey Thick Liquid: Not tested   Puree Puree: Within functional limits   Solid   GO    Solid: Impaired Presentation: Self Fed;Spoon Oral Phase Impairments: Impaired mastication Oral Phase Functional Implications: Other (comment) (prolonged mastication) Pharyngeal Phase Impairments: Multiple swallows       Oleksiak, Amy K, MA, CCC-SLP 09/03/2014,2:37 PM

## 2014-09-03 NOTE — Progress Notes (Signed)
09/03/2014 1315 Pt's HR dropped into 20's, assessed pt.  Wife states pt was just choking.  HR back up into 80-90's.  Notified Erin Barrett PA.  Orders received to keep pt NPO for now and she will speak with Dr. Nils Pyle. Carney Corners

## 2014-09-03 NOTE — Progress Notes (Signed)
Contacted by nurse stating patient was choking on his food at lunch.  She states she was contacted via cardiac monitoring that the patient was bradycardic with HR down to the 20s.  She went into evaluate the patient and he was choking on his food.  Once resolved the patient states he had problems eating this morning.  Even with just plain coffee he was choking.  I instructed the nurse to make the patient NPO and I placed a consult for Speech and Swallow evaluation.  He will also be started on maintenance IV Fluids.  Kieon Lawhorn PA-C

## 2014-09-03 NOTE — Progress Notes (Signed)
3 Days Post-Op Procedure(s) (LRB): AORTIC VALVE REPLACEMENT (AVR) (N/A) CORONARY ARTERY BYPASS GRAFTING (CABG) x six, using left internal mammary artery and right leg greater saphenous vein harvested endoscopically (N/A) TRANSESOPHAGEAL ECHOCARDIOGRAM (TEE) (N/A) Subjective: Dev afib last pm - now nauseated from amiodarone Will stop amio and use lopressor, lanoxin Still in afib 100-110/min Not good coumadin candidate- start low dose lovenox for now [facility] for transfer to stepdown  Objective: Vital signs in last 24 hours: Temp:  [97.4 F (36.3 C)-97.8 F (36.6 C)] 97.7 F (36.5 C) (05/29 0700) Pulse Rate:  [37-98] 66 (05/29 0900) Cardiac Rhythm:  [-] Atrial fibrillation (05/29 0800) Resp:  [14-38] 38 (05/29 0900) BP: (96-156)/(53-89) 131/82 mmHg (05/29 0900) SpO2:  [90 %-100 %] 99 % (05/29 0900) Weight:  [173 lb 8 oz (78.7 kg)] 173 lb 8 oz (78.7 kg) (05/29 0342)  Hemodynamic parameters for last 24 hours:  stable  Intake/Output from previous day: 05/28 0701 - 05/29 0700 In: 679.7 [P.O.:480; I.V.:199.7] Out: 1160 [Urine:1160] Intake/Output this shift: Total I/O In: 53.4 [I.V.:53.4] Out: -   abd soft extrem warm  Lab Results:  Recent Labs  09/02/14 0500 09/03/14 0257  WBC 9.1 8.4  HGB 7.7* 7.6*  HCT 24.4* 24.2*  PLT 120* 129*   BMET:  Recent Labs  09/02/14 0500 09/03/14 0257  NA 136 137  K 4.4 4.4  CL 105 103  CO2 25 27  GLUCOSE 94 91  BUN 23* 26*  CREATININE 1.44* 1.43*  CALCIUM 7.9* 8.3*    PT/INR:  Recent Labs  08/31/14 1525  LABPROT 16.8*  INR 1.35   ABG    Component Value Date/Time   PHART 7.364 09/01/2014 0318   HCO3 21.4 09/01/2014 0318   TCO2 20 09/01/2014 1630   ACIDBASEDEF 4.0* 09/01/2014 0318   O2SAT 99.0 09/01/2014 0318   CBG (last 3)   Recent Labs  09/02/14 0445 09/02/14 0758 09/02/14 1931  GLUCAP 99 94 88    Assessment/Plan: S/P Procedure(s) (LRB): AORTIC VALVE REPLACEMENT (AVR) (N/A) CORONARY ARTERY BYPASS GRAFTING  (CABG) x six, using left internal mammary artery and right leg greater saphenous vein harvested endoscopically (N/A) TRANSESOPHAGEAL ECHOCARDIOGRAM (TEE) (N/A) Plan for transfer to step-down: see transfer orders Dc amio Cont po iron for postop expected anemia  LOS: 3 days    Joe Kennedy 09/03/2014

## 2014-09-03 NOTE — Progress Notes (Signed)
09/03/2014 1145 Received pt to room 2w23 from 2S.  Pt is without c/o at this time.  Cardiac monitor applied and CCMD notified.  Oriented pt to room, call light and bed.  Call bell in reach. Carney Corners

## 2014-09-04 LAB — TISSUE CULTURE: CULTURE: NO GROWTH

## 2014-09-04 NOTE — Progress Notes (Signed)
[address]       [place] [address]             [phone]      4 Days Post-Op Procedure(s) (LRB): AORTIC VALVE REPLACEMENT (AVR) (N/A) CORONARY ARTERY BYPASS GRAFTING (CABG) x six, using left internal mammary artery and right leg greater saphenous vein harvested endoscopically (N/A) TRANSESOPHAGEAL ECHOCARDIOGRAM (TEE) (N/A) Subjective: Episode of choking and vagal induced bradycardia. ST evaluation - mostly a dentition problem  Objective: Vital signs in last 24 hours: Temp:  [98.4 F (36.9 C)-98.6 F (37 C)] 98.4 F (36.9 C) (05/30 0352) Pulse Rate:  [71-95] 72 (05/30 0352) Cardiac Rhythm:  [-] Normal sinus rhythm;Heart block (05/30 0756) Resp:  [18] 18 (05/30 0352) BP: (104-144)/(72-76) 144/72 mmHg (05/30 0352) SpO2:  [93 %-99 %] 93 % (05/30 0352) Weight:  [173 lb 12.8 oz (78.835 kg)] 173 lb 12.8 oz (78.835 kg) (05/30 0352)  Hemodynamic parameters for last 24 hours:    Intake/Output from previous day: 05/29 0701 - 05/30 0700 In: 313.4 [P.O.:240; I.V.:73.4] Out: 450 [Urine:450] Intake/Output this shift:    General appearance: alert, cooperative and no distress Heart: regular rate and rhythm Lungs: clear to auscultation bilaterally Abdomen: benign Extremities: no edema Wound: incis healing well  Lab Results:  Recent Labs  09/02/14 0500 09/03/14 0257  WBC 9.1 8.4  HGB 7.7* 7.6*  HCT 24.4* 24.2*  PLT 120* 129*   BMET:  Recent Labs  09/02/14 0500 09/03/14 0257  NA 136 137  K 4.4 4.4  CL 105 103  CO2 25 27  GLUCOSE 94 91  BUN 23* 26*  CREATININE 1.44* 1.43*  CALCIUM 7.9* 8.3*    PT/INR: No results for input(s): LABPROT, INR in the last 72 hours. ABG    Component Value Date/Time   PHART 7.364 09/01/2014 0318   HCO3 21.4 09/01/2014 0318   TCO2 20 09/01/2014 1630   ACIDBASEDEF 4.0* 09/01/2014 0318   O2SAT 99.0 09/01/2014 0318   CBG (last 3)   Recent Labs  09/02/14 0445 09/02/14 0758 09/02/14 1931  GLUCAP 99 94  88    Meds Scheduled Meds: . acetaminophen  1,000 mg Oral 4 times per day   Or  . acetaminophen (TYLENOL) oral liquid 160 mg/5 mL  1,000 mg Per Tube 4 times per day  . aspirin EC  325 mg Oral Daily   Or  . aspirin  324 mg Per Tube Daily  . bisacodyl  10 mg Oral Daily   Or  . bisacodyl  10 mg Rectal Daily  . digoxin  0.125 mg Oral Daily  . docusate sodium  200 mg Oral Daily  . enoxaparin (LOVENOX) injection  40 mg Subcutaneous QHS  . feeding supplement (ENSURE ENLIVE)  237 mL Oral BID BM  . feeding supplement (PRO-STAT SUGAR FREE 64)  30 mL Oral BID  . ferrous [id number] C-folic acid  1 capsule Oral BID AC  . furosemide  40 mg Oral Daily  . metoprolol tartrate  12.5 mg Oral BID   Or  . metoprolol tartrate  12.5 mg Per Tube BID  . pantoprazole  40 mg Oral Daily  . sodium chloride  3 mL Intravenous Q12H  . sodium chloride  3 mL Intravenous Q12H   Continuous Infusions: . sodium chloride 250 mL (09/01/14 0132)  . dextrose 5 % and 0.9% NaCl 50 mL/hr at 09/03/14 1454   PRN Meds:.sodium chloride, magnesium hydroxide, metoprolol, ondansetron (ZOFRAN) IV, oxyCODONE, sodium chloride, sodium  chloride, traMADol  Xrays Dg Chest 2 View  09/03/2014   CLINICAL DATA:  Postop cardiac surgery, day 4.  EXAM: CHEST  2 VIEW  COMPARISON:  09/02/2014  FINDINGS: Changes cardiac surgery and aortic valve replacement again noted. Cardiac silhouette is mildly enlarged. No mediastinal widening.  There is persistent opacity at the left lung base consistent with a combination of a small effusion and atelectasis. There is a minimal right effusion with associated subsegmental atelectasis.  No pulmonary edema.  No pneumothorax.  Right internal jugular introducer sheath has been removed.  IMPRESSION: 1. Persistent lung base atelectasis, left greater than right, and small left a minimal right pleural effusions. 2. No mediastinal widening, pulmonary edema or pneumothorax. No evidence pneumonia. No evidence of  an operative complication.   Electronically Signed   By: Lajean Manes M.D.   On: 09/03/2014 07:52    Assessment/Plan: S/P Procedure(s) (LRB): AORTIC VALVE REPLACEMENT (AVR) (N/A) CORONARY ARTERY BYPASS GRAFTING (CABG) x six, using left internal mammary artery and right leg greater saphenous vein harvested endoscopically (N/A) TRANSESOPHAGEAL ECHOCARDIOGRAM (TEE) (N/A)  1 overall doing well 2 maintaining sinus rhythm 3 creat will not tolerate ace inhib yet- recheck labs in am, Iron supplement 4 D2 diet for now 5 d/c pacer and monitor rhythm- brady appears vagal induced with brief choking 6 routine pulm toilet/rehab 7 should be able to d/c lasix soon  LOS: 4 days    Sanika Brosious E 09/04/2014

## 2014-09-04 NOTE — Progress Notes (Addendum)
Pt assisted to ambulate in hall using wheelchair, approx 150 ft on room air.  Gait variable, noting some weakness in right knee.  To bed after walk with call bell in reach.  Will con't plan of care.

## 2014-09-04 NOTE — Progress Notes (Signed)
Turned off and removed external pacer.  EPW's rolled and taped.  Pt comfortable in recliner, considering lunch, with wife at side.  Will con't plan of care.

## 2014-09-04 NOTE — Progress Notes (Signed)
4 Days Post-Op Procedure(s) (LRB): AORTIC VALVE REPLACEMENT (AVR) (N/A) CORONARY ARTERY BYPASS GRAFTING (CABG) x six, using left internal mammary artery and right leg greater saphenous vein harvested endoscopically (N/A) TRANSESOPHAGEAL ECHOCARDIOGRAM (TEE) (N/A) Subjective: Patient having no difficulty swallowing, walking comfortably in hallway Operative cultures remain negative Surgical incisions clean and dry Chest x-ray clear  Objective: Vital signs in last 24 hours: Temp:  [98.2 F (36.8 C)-98.6 F (37 C)] 98.2 F (36.8 C) (05/30 1415) Pulse Rate:  [63-72] 63 (05/30 1415) Cardiac Rhythm:  [-] Normal sinus rhythm;Heart block (05/30 0756) Resp:  [18] 18 (05/30 1415) BP: (117-144)/(69-75) 117/69 mmHg (05/30 1415) SpO2:  [93 %-99 %] 99 % (05/30 1415) Weight:  [173 lb 12.8 oz (78.835 kg)] 173 lb 12.8 oz (78.835 kg) (05/30 0352)  Hemodynamic parameters for last 24 hours:   stable  Intake/Output from previous day: 05/29 0701 - 05/30 0700 In: 313.4 [P.O.:240; I.V.:73.4] Out: 450 [Urine:450] Intake/Output this shift: Total I/O In: 120 [P.O.:120] Out: -   Neuro intact Regular rhythm No edema Looks good for probable discharge in a.m.  Lab Results:  Recent Labs  09/02/14 0500 09/03/14 0257  WBC 9.1 8.4  HGB 7.7* 7.6*  HCT 24.4* 24.2*  PLT 120* 129*   BMET:  Recent Labs  09/02/14 0500 09/03/14 0257  NA 136 137  K 4.4 4.4  CL 105 103  CO2 25 27  GLUCOSE 94 91  BUN 23* 26*  CREATININE 1.44* 1.43*  CALCIUM 7.9* 8.3*    PT/INR: No results for input(s): LABPROT, INR in the last 72 hours. ABG    Component Value Date/Time   PHART 7.364 09/01/2014 0318   HCO3 21.4 09/01/2014 0318   TCO2 20 09/01/2014 1630   ACIDBASEDEF 4.0* 09/01/2014 0318   O2SAT 99.0 09/01/2014 0318   CBG (last 3)   Recent Labs  09/02/14 0445 09/02/14 0758 09/02/14 1931  GLUCAP 99 94 88    Assessment/Plan: S/P Procedure(s) (LRB): AORTIC VALVE REPLACEMENT (AVR) (N/A) CORONARY  ARTERY BYPASS GRAFTING (CABG) x six, using left internal mammary artery and right leg greater saphenous vein harvested endoscopically (N/A) TRANSESOPHAGEAL ECHOCARDIOGRAM (TEE) (N/A)  Continue oral iron for postop anemia Stop Levaquin with negative operative cultures for 3 days Prepare for discharge home tomorrow  LOS: 4 days    Tharon Aquas Trigt III 09/04/2014

## 2014-09-05 LAB — CBC
HCT: 23.3 % — ABNORMAL LOW (ref 39.0–52.0)
Hemoglobin: 7.4 g/dL — ABNORMAL LOW (ref 13.0–17.0)
MCH: 26.4 pg (ref 26.0–34.0)
MCHC: 31.8 g/dL (ref 30.0–36.0)
MCV: 83.2 fL (ref 78.0–100.0)
Platelets: 191 10*3/uL (ref 150–400)
RBC: 2.8 MIL/uL — ABNORMAL LOW (ref 4.22–5.81)
RDW: 17.2 % — ABNORMAL HIGH (ref 11.5–15.5)
WBC: 6.4 10*3/uL (ref 4.0–10.5)

## 2014-09-05 LAB — BASIC METABOLIC PANEL
Anion gap: 6 (ref 5–15)
BUN: 27 mg/dL — AB (ref 6–20)
CO2: 25 mmol/L (ref 22–32)
Calcium: 7.8 mg/dL — ABNORMAL LOW (ref 8.9–10.3)
Chloride: 106 mmol/L (ref 101–111)
Creatinine, Ser: 1.25 mg/dL — ABNORMAL HIGH (ref 0.61–1.24)
GFR calc Af Amer: >60 mL/min (ref >60)
GFR, EST NON AFRICAN AMERICAN: 55 mL/min — AB (ref >60)
GLUCOSE: 83 mg/dL (ref 65–99)
POTASSIUM: 3.8 mmol/L (ref 3.5–5.1)
Sodium: 137 mmol/L (ref 135–145)

## 2014-09-05 LAB — ANAEROBIC CULTURE

## 2014-09-05 MED ORDER — ASPIRIN EC 81 MG PO TBEC: 81.0000 mg | DELAYED_RELEASE_TABLET | Freq: Every day | ORAL | Status: AC

## 2014-09-05 MED ORDER — METOPROLOL TARTRATE 25 MG PO TABS: 12.5000 mg | ORAL_TABLET | Freq: Two times a day (BID) | ORAL | Status: DC

## 2014-09-05 MED ORDER — POTASSIUM CHLORIDE CRYS ER 20 MEQ PO TBCR
20.0000 meq | EXTENDED_RELEASE_TABLET | Freq: Once | ORAL | Status: AC
  Administered 2014-09-05: 20 meq via ORAL
  Filled 2014-09-05: qty 1

## 2014-09-05 MED ORDER — TRAMADOL HCL 50 MG PO TABS: 50.0000 mg | ORAL_TABLET | Freq: Three times a day (TID) | ORAL | Status: DC | PRN

## 2014-09-05 MED ORDER — FE FUMARATE-B12-VIT C-FA-IFC PO CAPS: 1.0000 | ORAL_CAPSULE | Freq: Every day | ORAL | Status: DC

## 2014-09-05 NOTE — Progress Notes (Signed)
Pt pacing wires pulled per protocol. Pt tolerated well.  VSS recorded before and after  per 15 min intervals after. Pt on bedrest for an hour after. Pt educated.  Raliegh Ip RN

## 2014-09-05 NOTE — Discharge Summary (Signed)
Physician Discharge Summary       [address]       [place] [address]             [phone]    Patient ID: Joe Kennedy MRN: [id number] DOB/AGE: [birth year] 74 y.o.  Admit date: 08/31/2014 Discharge date: 09/05/2014  Admission Diagnoses: 1. Multivessel CAD 2. Severe aortic insufficiency 3. History of hypertension 4. History of NSTEMI  5. History of CHF (congestive heart failure) 6. History of PAF 7. History of endocarditis (Strep Viridans) 8. History of GERD 9. History of CKD (stage III)   Discharge Diagnoses:  1. Multivessel CAD 2. Severe aortic insufficiency 3. History of hypertension 4. History of NSTEMI  5. History of CHF (congestive heart failure) 6. History of PAF 7. History of endocarditis (Strep Viridans) 8. History of GERD 9. History of CKD (stage III) 10. OA of the knee 11. ABL anemia  Consults: Speech pathology  Procedure (s):  1. Median Sternotomy 2. Extracorporeal circulation 3. Coronary artery bypass grafting x 6   Left internal mammary graft to the LAD  SVG to diagonal  Sequential SVG to OM1 and OM2  Sequential SVG to PL1 and PL2 4. Endoscopic vein harvest from the right leg  5. Aortic valve replacement using a 27 mm Magna-Ease pericardial valve by Dr. Cyndia Kennedy on 08/31/2014.   History of Presenting Illness: The patient is a 74 year old gentleman with strep viridans aortic valve endocarditis with severe AI presenting with a several month history of chills, poor appetite and weight loss. He also has severe TR with no apparent vegetation on that valve and the valve structurally looks good. He has moderate CAD with no significant stenosis in the LAD but a significant antero-apical wall motion abnormality on LV gram. That could be due to embolic disease from the endocarditis. His LV function on the recent echos looks better than the cath. The OM is occluded but small and probably not graftable. The distal RCA has  significant disease that could be grafted. He had poor dentition and this was felt to be the likely cause of his endocarditis so he underwent extraction of all of his teeth by Dr. Enrique Kennedy on 07/20/2014. He completed his antibiotic therapy at home. He has not had a return appt with the ID physician yet. He has also had this right knee pain and had a joint aspiration with only a small amount of fluid that had a negative gram stain and negative culture so far. An MR did show some osteoarthritis and extensive degenerative tearing of the medial meniscus but no specific signs of infection or osteo. He has significant malnutrition and says he has not been able to eat any solid food since dental extraction but his wife is trying to improve his nutrition with Ensure milkshakes and soup. He was just recently back in the hospital a few days ago with shortness of breath after he didn't take his lasix for a day. He had a BNP of 1769 and troponin was mildly elevated at .08, .07. He is still weak. He denies any fever or chills or sweats.   He has severe AI following strep viridans endocarditis with NYHA class III heart failure symptoms of shortness of breath and fatigue with minimal activity and a recent admission for an exacerbation of heart failure after stopping lasix for a day. He has completed antibiotic treatment for his endocarditis and Dr. Cyndia Kennedy thought it was time to proceed with surgery. His nutritional state is  not great but I don't think it is going to improve if we wait. Dr. Cyndia Kennedy thought he would have a slow course and will need physical therapy postop. He has stage III CKD and moderate LV dysfunction. He has a history of PAF and is on Amiodarone and Eliquis. I would not plan to do a MAZE in this chronically ill gentleman who already requires a long surgery but would clip the left atrial appendage as long as there is no clot present on TEE in the OR. I discussed the operative procedure of aortic valve  replacement and CABG and clipping of the left atrial appendage with the patient and his wife including alternatives, benefits and risks; including but not limited to bleeding, blood transfusion, infection, stroke, myocardial infarction, graft failure, heart block requiring a permanent pacemaker, organ dysfunction, and death. Joe Kennedy understands and agrees to proceed. Pre operative carotid duplex showed no significant carotid artery stenosis bilaterally. He underwent a CABG x 6 and AVR on 08/31/2014.  Brief Hospital Course:  The patient was extubated the evening of surgery without difficulty. He remained afebrile and hemodynamically stable. Joe Kennedy, a line, chest tubes, and foley were removed early in the post operative course. He was given Levaquin empirically for several days and then this was stopped. Lopressor was started. He was volume over loaded and diuresed. He went into a fib with RVR and was started on Amiodarone. He had nausea secondary to Amiodarone. This was stopped and he was put on Digoxin. He converted and remained in sinus rhythm. Speech pathology consult was obtained as he was choking while eating. It was recommended that he be placed on a dysphagia 2 diet and that his diffiuculties were related to dentition. He had ABL anemia. His last H and H was 7.4 and 23.3 He did not require a post op transfusion. He was started on Trinsicon. He was weaned off the insulin drip. The patient's glucose remained well controlled. The patient's HGA1C pre op was 5.2. The patient was felt surgically stable for transfer from the ICU to PCTU for further convalescence on 09/03/2014. He continues to progress with cardiac rehab. He is deconditioned and would benefit from home PT. He was ambulating on room air. He has been tolerating a diet and has had a bowel movement. Epicardial pacing wires were removed today. Chest tube sutures will be removed by home health on 09/11/2014. The patient is felt surgically  stable for discharge today.   Latest Vital Signs: Blood pressure 130/61, pulse 76, temperature 98.2 F (36.8 C), temperature source Oral, resp. rate 18, height 6' (1.829 m), weight 173 lb 4.8 oz (78.608 kg), SpO2 97 %.  Physical Exam: Cardiovascular: RRR Pulmonary: Clear to auscultation bilaterally; no rales, wheezes, or rhonchi. Abdomen: Soft, non tender, bowel sounds present. Extremities: No lower extremity edema. Wounds: Clean and dry. No erythema or signs of infection.  Discharge Condition:Stable and discharged to home  Recent laboratory studies:  Lab Results  Component Value Date   WBC 6.4 09/05/2014   HGB 7.4* 09/05/2014   HCT 23.3* 09/05/2014   MCV 83.2 09/05/2014   PLT 191 09/05/2014   Lab Results  Component Value Date   NA 137 09/05/2014   K 3.8 09/05/2014   CL 106 09/05/2014   CO2 25 09/05/2014   CREATININE 1.25* 09/05/2014   GLUCOSE 83 09/05/2014      Diagnostic Studies: Dg Chest 2 View  09/03/2014   CLINICAL DATA:  Postop cardiac surgery, day 4.  EXAM:  CHEST  2 VIEW  COMPARISON:  09/02/2014  FINDINGS: Changes cardiac surgery and aortic valve replacement again noted. Cardiac silhouette is mildly enlarged. No mediastinal widening.  There is persistent opacity at the left lung base consistent with a combination of a small effusion and atelectasis. There is a minimal right effusion with associated subsegmental atelectasis.  No pulmonary edema.  No pneumothorax.  Right internal jugular introducer sheath has been removed.  IMPRESSION: 1. Persistent lung base atelectasis, left greater than right, and small left a minimal right pleural effusions. 2. No mediastinal widening, pulmonary edema or pneumothorax. No evidence pneumonia. No evidence of an operative complication.   Electronically Signed   By: Lajean Manes M.D.   On: 09/03/2014 07:52    Discharge Instructions    Amb Referral to Cardiac Rehabilitation    Complete by:  As directed   Congestive Heart Failure: If  diagnosis is Heart Failure, patient MUST meet each of the CMS criteria: 1. Left Ventricular Ejection Fraction </= 35% 2. NYHA class II-IV symptoms despite being on optimal heart failure therapy for at least 6 weeks. 3. Stable = have not had a recent (<6 weeks) or planned (<6 months) major cardiovascular hospitalization or procedure  Program Details: - Physician supervised classes - 1-3 classes per week over a 12-18 week period, generally for a total of 36 sessions  Physician Certification: I certify that the above Cardiac Rehabilitation treatment is medically necessary and is medically approved by me for treatment of this patient. The patient is willing and cooperative, able to ambulate and medically stable to participate in exercise rehabilitation. The participant's progress and Individualized Treatment Plan will be reviewed by the Medical Director, Cardiac Rehab staff and as indicated by the Referring/Ordering Physician.  Diagnosis:   CABG Valve Replacement/Repair    Valve:  Aortic           Discharge Medications:   Medication List    STOP taking these medications        amiodarone 200 MG tablet  Commonly known as:  PACERONE     furosemide 40 MG tablet  Commonly known as:  LASIX     NITROSTAT 0.4 MG SL tablet  Generic drug:  nitroGLYCERIN     potassium chloride SA 20 MEQ tablet  Commonly known as:  K-DUR,KLOR-CON      TAKE these medications        aspirin EC 81 MG tablet  Take 1 tablet (81 mg total) by mouth daily.     digoxin 0.125 MG tablet  Commonly known as:  LANOXIN  Take 125 mcg by mouth daily.     ELIQUIS 5 MG Tabs tablet  Generic drug:  apixaban  Take 5 mg by mouth 2 (two) times daily.     feeding supplement (ENSURE ENLIVE) Liqd  Take 237 mLs by mouth 2 (two) times daily between meals.     ferrous [id number] C-folic acid capsule  Commonly known as:  TRINSICON / FOLTRIN  Take 1 capsule by mouth daily with breakfast. For one month then stop.       HYDROcodone-acetaminophen 5-325 MG per tablet  Commonly known as:  NORCO/VICODIN  Take 1 tablet by mouth every 4 (four) hours as needed.     metoprolol tartrate 25 MG tablet  Commonly known as:  LOPRESSOR  Take 0.5 tablets (12.5 mg total) by mouth 2 (two) times daily.     pantoprazole 40 MG tablet  Commonly known as:  PROTONIX  Take 1 tablet (40 mg total)  by mouth daily.     traMADol 50 MG tablet  Commonly known as:  ULTRAM  Take 1 tablet (50 mg total) by mouth 3 (three) times daily as needed for moderate pain.       The patient has been discharged on:   1.Beta Blocker:  Yes [ x  ]                              No   [   ]                              If No, reason:  2.Ace Inhibitor/ARB: Yes [   ]                                     No  [  x  ]                                     If No, reason: Elevated creatinine  3.Statin:   Yes [   ]                  No  [ x  ]                  If No, reason: Allergy  4.Shela Commons:  Yes  [ x  ]                  No   [   ]                  If No, reason:  Follow Up Appointments: Follow-up Information    Follow up with Yolonda Kida., MD.   Specialties:  Cardiology, Internal Medicine   Why:  Call for a follow up appointment for 2 weeks   Contact information:   [address] [place] [address] [phone]       Follow up with Gaye Pollack, MD On 10/11/2014.   Specialty:  Cardiothoracic Surgery   Why:  PA/LAT CXR to be taken (at [facility] which is in the same building as Dr. Vivi Martens office) on 10/11/2014 at 8:30 am ;Appointment with Dr. Cyndia Kennedy is at 9:30 am   Contact information:   [address] [place] [address] [phone]       Follow up with [facility] On 09/11/2014.   Why:  Please remove chest tube sutures on 09/11/2014.      Signed: Ruthellen Tippy MPA-C 09/05/2014, 10:52 AM

## 2014-09-05 NOTE — Progress Notes (Signed)
UR Completed. Yiannis Tulloch, RN, BSN.  [phone] 

## 2014-09-05 NOTE — Progress Notes (Addendum)
      [address]       [place] [address]             [phone]       5 Days Post-Op Procedure(s) (LRB): AORTIC VALVE REPLACEMENT (AVR) (N/A) CORONARY ARTERY BYPASS GRAFTING (CABG) x six, using left internal mammary artery and right leg greater saphenous vein harvested endoscopically (N/A) TRANSESOPHAGEAL ECHOCARDIOGRAM (TEE) (N/A)  Subjective: Patient without complaints and he hopes to go home.  Objective: Vital signs in last 24 hours: Temp:  [98.1 F (36.7 C)-98.2 F (36.8 C)] 98.2 F (36.8 C) (05/31 0406) Pulse Rate:  [63-79] 73 (05/31 0406) Cardiac Rhythm:  [-] Normal sinus rhythm;Heart block (05/30 2330) Resp:  [18] 18 (05/31 0406) BP: (107-120)/(55-69) 120/58 mmHg (05/31 0406) SpO2:  [96 %-99 %] 96 % (05/31 0406) Weight:  [173 lb 4.8 oz (78.608 kg)] 173 lb 4.8 oz (78.608 kg) (05/31 0406)  Pre op weight 74.9 kg Current Weight  09/05/14 173 lb 4.8 oz (78.608 kg)   Intake/Output from previous day: 05/30 0701 - 05/31 0700 In: 360 [P.O.:360] Out: 580 [Urine:580]   Physical Exam:  Cardiovascular: RRR Pulmonary: Clear to auscultation bilaterally; no rales, wheezes, or rhonchi. Abdomen: Soft, non tender, bowel sounds present. Extremities: No lower extremity edema. Wounds: Clean and dry.  No erythema or signs of infection.  Lab Results: CBC: Recent Labs  09/03/14 0257 09/05/14 0340  WBC 8.4 6.4  HGB 7.6* 7.4*  HCT 24.2* 23.3*  PLT 129* 191   BMET:  Recent Labs  09/03/14 0257 09/05/14 0340  NA 137 137  K 4.4 3.8  CL 103 106  CO2 27 25  GLUCOSE 91 83  BUN 26* 27*  CREATININE 1.43* 1.25*  CALCIUM 8.3* 7.8*    PT/INR:  Lab Results  Component Value Date   INR 1.35 08/31/2014   INR 1.11 08/28/2014   INR 1.21 07/07/2014   ABG:  INR: Will add last result for INR, ABG once components are confirmed Will add last 4 CBG results once components are confirmed  Assessment/Plan:  1. CV - Previous a fib. Maintaining SR in the  70's this am. On Lopressor 12.5 mg bid, Digoxin 0.125 mg daily. He was on Eliquis pre op for a fib. Will likely restart Eliquis at discharge. He was also on oral Amiodarone prior to surgery, but I will not resume since had nausea with it previously. 2.  Pulmonary - On room air. Encourage incentive  3. Volume Overload - On Lasix 40 mg daily. He is below pre op weight and no LE edema. He was on Lasix 60 mg daily prior to surgery, but he was in CHF. He will likely not need at discharge 4.  Acute blood loss anemia - H and H remains 7.4 and 23.3. Continue Trinsicon 5. Supplement potassium 6. Creatinine decreased from 1.43 to 1.25. He has a history of CKD stage III 7. Remove EPW 8. GI-tolerating dysphagia 2 diet. Will put on soft diet at discharge. He is not at risk for aspiration but does need dentures 9. He is not on a statin. Has allergy to Lipitor. Will discuss with Dr. Cyndia Bent. 10. Possibly discharge later today  ZIMMERMAN,DONIELLE MPA-C 09/05/2014,7:49 AM   Chart reviewed, patient examined, agree with above. He looks good and says he feels fine and wants to go home. Bowels working and ambulating. His operative valve cultures are negative so Levaquin can stop. Plan home today.

## 2014-09-05 NOTE — Progress Notes (Addendum)
Pt D/C to home with wife. IV removed. Skin clean, dry and intact. VSS. Discharge instructions reviewed with p and pt's wife. Prescriptions given. All questions answered.   Raliegh Ip RN

## 2014-09-05 NOTE — Discharge Instructions (Signed)
Activity: 1.May walk up steps                2.No lifting more than ten pounds for four weeks.                 3.No driving for four weeks.                4.Stop any activity that causes chest pain, shortness of breath, dizziness, sweating or excessive weakness.                5.Avoid straining.                6.Continue with your breathing exercises daily.  Diet: Low fat, Low salt SOFT diet  Wound Care: May shower.  Clean wounds with mild soap and water daily. Contact the office at [phone] if any problems arise.  Coronary Artery Bypass Grafting, Care After Refer to this sheet in the next few weeks. These instructions provide you with information on caring for yourself after your procedure. Your health care provider may also give you more specific instructions. Your treatment has been planned according to current medical practices, but problems sometimes occur. Call your health care provider if you have any problems or questions after your procedure. WHAT TO EXPECT AFTER THE PROCEDURE Recovery from surgery will be different for everyone. Some people feel well after 3 or 4 weeks, while for others it takes longer. After your procedure, it is typical to have the following:  Nausea and a lack of appetite.   Constipation.  Weakness and fatigue.   Depression or irritability.   Pain or discomfort at your incision site. HOME CARE INSTRUCTIONS  Take medicines only as directed by your health care provider. Do not stop taking medicines or start any new medicines without first checking with your health care provider.  Take your pulse as directed by your health care provider.  Perform deep breathing as directed by your health care provider. If you were given a device called an incentive spirometer, use it to practice deep breathing several times a day. Support your chest with a pillow or your arms when you take deep breaths or cough.  Keep incision areas clean, dry, and protected. Remove  or change any bandages (dressings) only as directed by your health care provider. You may have skin adhesive strips over the incision areas. Do not take the strips off. They will fall off on their own.  Check incision areas daily for any swelling, redness, or drainage.  If incisions were made in your legs, do the following:  Avoid crossing your legs.   Avoid sitting for long periods of time. Change positions every 30 minutes.   Elevate your legs when you are sitting.  Wear compression stockings as directed by your health care provider. These stockings help keep blood clots from forming in your legs.  Take showers once your health care provider approves. Until then, only take sponge baths. Pat incisions dry. Do not rub incisions with a washcloth or towel. Do not take baths, swim, or use a hot tub until your health care provider approves.  Eat foods that are high in fiber, such as raw fruits and vegetables, whole grains, beans, and nuts. Meats should be lean cut. Avoid canned, processed, and fried foods.  Drink enough fluid to keep your urine clear or pale yellow.  Weigh yourself every day. This helps identify if you are retaining fluid that may make your heart and lungs work harder.  Rest and limit activity as directed by your health care provider. You may be instructed to:  Stop any activity at once if you have chest pain, shortness of breath, irregular heartbeats, or dizziness. Get help right away if you have any of these symptoms.  Move around frequently for short periods or take short walks as directed by your health care provider. Increase your activities gradually. You may need physical therapy or cardiac rehabilitation to help strengthen your muscles and build your endurance.  Avoid lifting, pushing, or pulling anything heavier than 10 lb (4.5 kg) for at least 6 weeks after surgery.  Do not drive until your health care provider approves.  Ask your health care provider when  you may return to work.  Ask your health care provider when you may resume sexual activity.  Keep all follow-up visits as directed by your health care provider. This is important. SEEK MEDICAL CARE IF:  You have swelling, redness, increasing pain, or drainage at the site of an incision.  You have a fever.  You have swelling in your ankles or legs.  You have pain in your legs.   You gain 2 or more pounds (0.9 kg) a day.  You are nauseous or vomit.  You have diarrhea. SEEK IMMEDIATE MEDICAL CARE IF:  You have chest pain that goes to your jaw or arms.  You have shortness of breath.   You have a fast or irregular heartbeat.   You notice a "clicking" in your breastbone (sternum) when you move.   You have numbness or weakness in your arms or legs.  You feel dizzy or light-headed.  MAKE SURE YOU:  Understand these instructions.  Will watch your condition.  Will get help right away if you are not doing well or get worse. Document Released: 10/11/2004 Document Revised: 08/08/2013 Document Reviewed: 08/31/2012 [facility] Patient Information 2015 [place], [place]. This information is not intended to replace advice given to you by your health care provider. Make sure you discuss any questions you have with your health care provider.

## 2014-09-05 NOTE — Progress Notes (Signed)
Patient ambulated 150 ft. Using a rollator, pt. Tolerated well, back in bed. Call bell within reach.

## 2014-09-05 NOTE — Progress Notes (Signed)
CARDIAC REHAB PHASE I   PRE:  Rate/Rhythm: 75 SR  BP:   Sitting: 134/67     SaO2: 97% RA  MODE:  Ambulation: 210ft   POST:  Rate/Rhythm: 86 SR  BP:   Sitting: 142/62     SaO2: 99% RA 1000-1028  Pt. walked 210 feet with walker and 1 person assist. Pt. increased distance. Pt. stated soreness/tenderness in chest region. Returned to recliner with LE elevation/VSS. Pt. performed IS and reached [phone] mL. Did teach back and pt. explained sternal precautions and wound care.  Joe Kennedy D Mohsin Crum,MS,ACSM-RCEP 09/05/2014 10:23 AM

## 2014-09-06 NOTE — Care Management Note (Signed)
Case Management Note  Patient Details  Name: Joe Kennedy MRN: [id number] Date of Birth: 30-Aug-1940  Subjective/Objective:                    Action/Plan:   Expected Discharge Date:                  Expected Discharge Plan:  [place]  In-House Referral:     Discharge planning Services     Post Acute Care Choice:    Choice offered to:     DME Arranged:    DME Agency:     HH Arranged:    [place] Agency:     Status of Service:  Completed, signed off  Medicare Important Message Given:  Yes Date Medicare IM Given:  09/05/14 Medicare IM give by:  Jonnie Finner RN CCM  Date Additional Medicare IM Given:    Additional Medicare Important Message give by:     If discussed at [place] of Stay Meetings, dates discussed:  09/05/2014  Additional Comments:  Delrae Sawyers, RN 09/06/2014, 9:36 AM

## 2014-09-07 ENCOUNTER — Other Ambulatory Visit: Payer: Self-pay

## 2014-09-07 NOTE — Anesthesia Postprocedure Evaluation (Signed)
  Anesthesia Post-op Note  Patient: Joe Kennedy  Procedure(s) Performed: Procedure(s): AORTIC VALVE REPLACEMENT (AVR) (N/A) CORONARY ARTERY BYPASS GRAFTING (CABG) x six, using left internal mammary artery and right leg greater saphenous vein harvested endoscopically (N/A) TRANSESOPHAGEAL ECHOCARDIOGRAM (TEE) (N/A)  Patient Location: SICU  Anesthesia Type:General  Level of Consciousness: sedated and Patient remains intubated per anesthesia plan  Airway and Oxygen Therapy: Patient remains intubated per anesthesia plan and Patient placed on Ventilator (see vital sign flow sheet for setting)  Post-op Pain: none  Post-op Assessment: Post-op Vital signs reviewed  Post-op Vital Signs: stable  Last Vitals:  Filed Vitals:   09/05/14 1414  BP: 110/56  Pulse: 72  Temp: 36.8 C  Resp: 18    Complications: No apparent anesthesia complications

## 2014-09-07 NOTE — Patient Outreach (Signed)
Referral received to assess for care management services. Explained that [facility] is a covered benefit of insurance.    Review information for [facility] and  The differences between care management and home health services.  Patient states that he has been doing fine since his surgery and the nurse from home health has been out and scheduled to come out tomorrow. Explained that [facility] does not interfere with or replace any services arranged by the inpatient care management staff.  Patient declined services with [facility] at this time.  Patient requested information be sent to his home address.  Will have information sent from [facility] to his address and encouraged him to call if he had additional questions or needs.  For questions, please contact: Natividad Brood, RN BSN [facility] Liaison  [phone] business mobile phone

## 2014-09-12 ENCOUNTER — Encounter: Payer: Self-pay | Admitting: Cardiovascular Disease

## 2014-09-13 DIAGNOSIS — Z48812 Encounter for surgical aftercare following surgery on the circulatory system: Secondary | ICD-10-CM | POA: Diagnosis not present

## 2014-09-21 DIAGNOSIS — I339 Acute and subacute endocarditis, unspecified: Secondary | ICD-10-CM | POA: Diagnosis present

## 2014-09-21 DIAGNOSIS — I39 Endocarditis and heart valve disorders in diseases classified elsewhere: Secondary | ICD-10-CM | POA: Diagnosis present

## 2014-09-21 DIAGNOSIS — I509 Heart failure, unspecified: Secondary | ICD-10-CM | POA: Insufficient documentation

## 2014-09-21 DIAGNOSIS — I359 Nonrheumatic aortic valve disorder, unspecified: Secondary | ICD-10-CM | POA: Insufficient documentation

## 2014-09-26 ENCOUNTER — Ambulatory Visit: Payer: Self-pay

## 2014-10-05 ENCOUNTER — Encounter: Payer: Self-pay | Admitting: Cardiovascular Disease

## 2014-10-05 ENCOUNTER — Ambulatory Visit (INDEPENDENT_AMBULATORY_CARE_PROVIDER_SITE_OTHER): Payer: Commercial Managed Care - HMO | Admitting: Cardiovascular Disease

## 2014-10-05 ENCOUNTER — Encounter: Payer: Self-pay | Admitting: *Deleted

## 2014-10-05 VITALS — BP 124/80 | HR 68 | Ht 72.0 in | Wt 163.2 lb

## 2014-10-05 DIAGNOSIS — I4891 Unspecified atrial fibrillation: Secondary | ICD-10-CM

## 2014-10-05 DIAGNOSIS — I214 Non-ST elevation (NSTEMI) myocardial infarction: Secondary | ICD-10-CM

## 2014-10-05 DIAGNOSIS — I2510 Atherosclerotic heart disease of native coronary artery without angina pectoris: Secondary | ICD-10-CM

## 2014-10-05 DIAGNOSIS — I339 Acute and subacute endocarditis, unspecified: Secondary | ICD-10-CM

## 2014-10-05 DIAGNOSIS — I39 Endocarditis and heart valve disorders in diseases classified elsewhere: Secondary | ICD-10-CM

## 2014-10-05 MED ORDER — ELIQUIS 5 MG PO TABS: 5.0000 mg | ORAL_TABLET | Freq: Two times a day (BID) | ORAL | Status: DC

## 2014-10-05 NOTE — Patient Instructions (Signed)
Medication Instructions: - no changs  Labwork: - none  Procedures/Testing: - none  Follow-Up: - Your physician recommends that you schedule a follow-up appointment in: 3 months with Dr. Acie Fredrickson.  Any Additional Special Instructions Will Be Listed Below (If Applicable).

## 2014-10-05 NOTE — Progress Notes (Signed)
CARDIOLOGY OFFICE NOTE  Date:  10/05/2014    Joe Kennedy Date of Birth: 1941-02-25 Medical Record #[id number]  PCP:  Otilio Miu, MD  Cardiologist:  Nahser (formally saw Dr. Clayborn Bigness)    Chief Complaint  Patient presents with  . other    Follow up from CABG x 6 & valve replacement. Meds reviewed by the patient verbally. "doing well." Pt. is unsteady on his feet.      History of Present Illness: Joe Kennedy is a 74 y.o. male who presents today for a post hospital visit. Seen for Dr. Acie Fredrickson. Former patient of Dr. Etta Quill from [place]. He has a history of atrial fibrillation on Eliquis, CAD, HLD, chronic systolic heart failure (EF 35%), chronic anemia, and HTN.   He presented to [facility] with a syncopal episode on 07/04/2014. He eventually ruled in for NSTEMI and was transferred to [facility] for evaluation. Patient was initially admitted to the cardiology service.  He previously received his cardiology care by Dr. Clayborn Bigness. He was hospitalized earlier in March of 2016 with angina, NSTEMI and had a cardiac catheterization, which showed two-vessel disease not amenable to intervention. Medical management was recommended.   He re-presented to [facility] on 07/04/14 with syncope while at his PCP's office. This hospitalization was further complicated by hypotension, right lower lobe pneumonia, acute hypoxic respiratory failure requiring BiPAP, acute renal failure, bacteremia ( gram + cocci), afib with RVR and NSTEMI. During his admission he developed further chest pain and his troponin was noted to be elevated and peaked around 11. His cardiologist, Dr. Clayborn Bigness, was notified and he recommended CABG. He was transferred to [facility] for further evaluation. He was transferred on IV heparin and IV Vanc.   Patient was noted to have a viridans group streptococcal bacteremia and endocarditis. ID was consulted. Patient was placed empirically on IV vancomycin. Patient will  need at least 4 weeks of IV vancomycin as per ID recommendations. He remained afebrile. WBC normalized. Patient was seen in consultation by cardiothoracic surgery for further evaluation of endocarditis and aortic valve and severe aortic valvular regurgitation. He has had teeth extraction last week. He is to continue on IV vancomycin through 08/03/2012. Patient will follow-up with ID as outpatient.  His hospitalization was complicated by right knee pain - MRI of the right knee with tricompartmental osteoarthritis most advanced within the medial compartment. Small complex knee joint effusion no specific signs of joint infection osteomyelitis on noncontrast imaging. Extensive degenerative tearing of the medial meniscus. Anterior cruciate ligament mucoid degeneration. Treated with pain management.   Noted to have severe aortic insufficiency/aortic valvular endocarditis during his work up for his bacteremia and non-STEMI.  Patient may need a aVR/CABG. Patient has been off anticoagulation secondary to recent GI bleed and is too high risk for colonoscopy. Patient will need several weeks of IV antibiotics through 08/04/2014 per ID recommendations. Anticoagulation was resumed. Cardiothoracic surgery recommended patient be seen by nutrition for his malnutrition and continue antibiotic treatment for his endocarditis while his dental workup and treatment is done.   Patient was seen by Dr. Cyndia Bent who noted: The patient has strep viridans aortic valve endocarditis with severe AI presenting with a several month history of chills, poor appetite and weight loss. He also has severe TR with no apparent vegetation on that valve and the valve structurally looks good. He has moderate CAD with no significant stenosis in the LAD but a significant antero-apical wall motion abnormality on LV gram. That  could be due to embolic disease from the endocarditis. The OM is occluded but small and probably not graftable. The distal RCA has  significant disease that could be grafted. He has bad teeth and will need to be seen by Dr. Lawana Chambers and will probably need to have the remaining teeth pulled. I will call him. He has been hemodynamically stable and tolerating the AI well so urgent surgery not indicated and I think his teeth should be dealt with first. His LV function on the recent echos looks better than the cath. He has also had this right knee pain and had a joint aspiration with only a small amount of fluid that had a negative gram stain and negative culture so far. An MR did show some osteoarthritis and extensive degenerative tearing of the medial meniscus but no specific signs of infection or osteo. He has severe malnutrition and will benefit from a period of nutrition and antibiotic treatment of his endocarditis while his dental workup and treatment is done. I will ask nutritionist to see him. I will continue to follow his progress and decide when to do surgery.  He did have a GI bleed during this last admission and was seen in consultation by gastroenterology. IV heparin was discontinued. It was felt patient was too high risk for colonoscopy and patient likely had a diverticular bleed. Patient was monitored his bleeding subsided and resolved. Patient's hemoglobin remained stable. Patient's diet was advanced and he was tolerating a solid diet by day of discharge. Patient's anticoagulation was resumed and patient was monitored for 24 hours on anticoagulation and did not have any further bleeding.   2-D echo showed an EF of 55-60% with grade 2 diastolic dysfunction.   Cath report noted to have LAD moderate disease. Left circumflex had a tight stenosis but not a good candidate for CABG. Subtotal OM. RCA had distal disease or could potentially be stented.   TEE with a EF of 60% severe aortic regurgitation, severe tricuspid regurgitation, vegetation on aortic valve.   Also with right lower lobe pneumonia and was treated on antibiotics.     He was in atrial fib - managed with rate control.  Patient received a full course of antibiotic therapy during the hospitalization. His bleeding resolved - his hemoglobin stabilized and patient was subsequently started back on eliquis per cardiology due to concerns for increased risk for septic emboli and possible CVA .   Comes in today. Here with his wife. Seems to be holding his own. Not doing much. Pretty sedentary. No fever. No cough. Short of breath at night if he tries to lie down according to his wife - not by him - he denied being short of breath. Weight is stable. No chest pain. No bleeding. Wants to stop some medicines. Had his teeth extracted. Soft diet tolerated.   October 05, 2014:    Past Medical History  Diagnosis Date  . Hypertension   . Myocardial infarction   . CHF (congestive heart failure)   . Shortness of breath dyspnea   . Arthritis     KNEES  . Heart murmur   . Pneumonia     hx 3/15  . GERD (gastroesophageal reflux disease)     occ  . Status post PICC central line placement     placed for vanco infusion rt arm    Past Surgical History  Procedure Laterality Date  . Tee without cardioversion N/A 07/11/2014    Procedure: TRANSESOPHAGEAL ECHOCARDIOGRAM (TEE);  Surgeon: Wallis Bamberg  Johnsie Cancel, MD;  Location: [facility];  Service: Cardiovascular;  Laterality: N/A;  . Tonsillectomy    . Multiple extractions with alveoloplasty N/A 07/20/2014    Procedure: Extraction of tooth #'s 2,6,8,14,21,22,23,27,28,30 with alveoloplasty ;  Surgeon: Lenn Cal, DDS;  Location: [facility];  Service: Oral Surgery;  Laterality: N/A;  . Aortic valve replacement N/A 08/31/2014    Procedure: AORTIC VALVE REPLACEMENT (AVR);  Surgeon: Gaye Pollack, MD;  Location: [place];  Service: Open Heart Surgery;  Laterality: N/A;  . Coronary artery bypass graft N/A 08/31/2014    Procedure: CORONARY ARTERY BYPASS GRAFTING (CABG) x six, using left internal mammary artery and right leg greater saphenous vein  harvested endoscopically;  Surgeon: Gaye Pollack, MD;  Location: [place];  Service: Open Heart Surgery;  Laterality: N/A;  . Tee without cardioversion N/A 08/31/2014    Procedure: TRANSESOPHAGEAL ECHOCARDIOGRAM (TEE);  Surgeon: Gaye Pollack, MD;  Location: [place];  Service: Open Heart Surgery;  Laterality: N/A;     Medications: Current Outpatient Prescriptions  Medication Sig Dispense Refill  . aspirin EC 81 MG tablet Take 1 tablet (81 mg total) by mouth daily.    . digoxin (LANOXIN) 0.125 MG tablet Take 125 mcg by mouth daily.  6  . ELIQUIS 5 MG TABS tablet Take 5 mg by mouth 2 (two) times daily.     . feeding supplement, ENSURE ENLIVE, (ENSURE ENLIVE) LIQD Take 237 mLs by mouth 2 (two) times daily between meals. 237 mL 12  . ferrous [id number] C-folic acid (TRINSICON / FOLTRIN) capsule Take 1 capsule by mouth daily with breakfast. For one month then stop. 30 capsule 0  . metoprolol (LOPRESSOR) 25 MG tablet Take 0.5 tablets (12.5 mg total) by mouth 2 (two) times daily. 60 tablet 1  . pantoprazole (PROTONIX) 40 MG tablet Take 1 tablet (40 mg total) by mouth daily. 30 tablet 0  . traMADol (ULTRAM) 50 MG tablet Take 1 tablet (50 mg total) by mouth 3 (three) times daily as needed for moderate pain. 30 tablet 0   No current facility-administered medications for this visit.    Allergies: Allergies  Allergen Reactions  . Lipitor [Atorvastatin] Other (See Comments)    Abdominal pain, nausea and chest pain  . Penicillins     Difficulty breathing    Social History: The patient  reports that he has never smoked. He has never used smokeless tobacco. He reports that he drinks about 0.6 oz of alcohol per week. He reports that he does not use illicit drugs.   Family History: The patient's family history includes Coronary artery disease in his brother.   Review of Systems: Please see the history of present illness.   Otherwise, the review of systems is positive for appetite change,  DOE, and headaches.   All other systems are reviewed and negative.   Physical Exam: VS:  BP 124/80 mmHg  Pulse 68  Ht 6' (1.829 m)  Wt 74.05 kg (163 lb 4 oz)  BMI 22.14 kg/m2 .  BMI Body mass index is 22.14 kg/(m^2).  Wt Readings from Last 3 Encounters:  10/05/14 74.05 kg (163 lb 4 oz)  09/05/14 78.608 kg (173 lb 4.8 oz)  08/28/14 74.934 kg (165 lb 3.2 oz)    General: Pleasant. Looks chronically ill but in no acute distress.  HEENT: Normal. Neck: Supple, no JVD, carotid bruits, or masses noted.  Cardiac: Regular rate and rhythm. Systolic murmur noted. Heart sounds are distant. No edema.  Respiratory:  Lungs  are decreased with normal work of breathing.  GI: Soft and nontender.  MS: No deformity or atrophy. Gait and ROM intact. Skin: Warm and dry. Color is sallow. Neuro:  Strength and sensation are intact and no gross focal deficits noted.  Psych: Alert, appropriate and with normal affect.   LABORATORY DATA:  EKG:  EKG is ordered today. This demonstrates sinus brady with inferolateral T wave changes.  QT 464.    Lab Results  Component Value Date   WBC 6.4 09/05/2014   HGB 7.4* 09/05/2014   HCT 23.3* 09/05/2014   PLT 191 09/05/2014   GLUCOSE 83 09/05/2014   CHOL 82 07/07/2014   TRIG 103 07/07/2014   HDL 18* 07/07/2014   LDLCALC 43 07/07/2014   ALT 13* 08/28/2014   AST 16 08/28/2014   NA 137 09/05/2014   K 3.8 09/05/2014   CL 106 09/05/2014   CREATININE 1.25* 09/05/2014   BUN 27* 09/05/2014   CO2 25 09/05/2014   TSH 2.343 07/06/2014   INR 1.35 08/31/2014   HGBA1C 5.2 08/28/2014    BNP (last 3 results)  Recent Labs  07/06/14 1940 08/20/14 0426  BNP 779.9* 1769.0*    ProBNP (last 3 results)  Recent Labs  07/25/14 1053 08/02/14 1327  PROBNP 1471.0* 1522.0*     Other Studies Reviewed Today:  TEE Study Conclusions  - Impressions: Mild LVE EF 60% Aortic valve SBE with small marantic like vegetations no abscess Severe AR Regurgitation  largest though commisure between right and non coronary cusps. Mild MR Severe TR estimated PA pressure 74 mmHg Normla PV LAE No aortic debris  Recommend Rx SBE with 6 weeks of antibiotics and consideration for repeat TEE possible need for surgery given severe AR Future TEE;s need propofol as intubation of esophagus very difficult  [place]   TTE Study Conclusions  - Left ventricle: The cavity size was mildly dilated. There was mild concentric hypertrophy. Systolic function was normal. The estimated ejection fraction was in the range of 50% to 55%. Features are consistent with a pseudonormal left ventricular filling pattern, with concomitant abnormal relaxation and increased filling pressure (grade 2 diastolic dysfunction). - Regional wall motion abnormality: Mild hypokinesis of the mid-apical anterior, mid anterolateral, apical lateral, and apical myocardium. - Aortic valve: Trileaflet; mildly thickened, mildly calcified leaflets. There was no stenosis. There was moderate to severe regurgitation. - Mitral valve: Mildly thickened leaflets . There was mild regurgitation. - Left atrium: The atrium was mildly dilated. Volume/bsa, ES, (1-plane Simpson&'s, A2C): 30.1 ml/m^2. - Right ventricle: Systolic function was mildly reduced. - Right atrium: The atrium was mildly dilated. - Tricuspid valve: There was moderate-severe regurgitation. - Pulmonary arteries: PA peak pressure: 69 mm Hg (S). Severely elevated pulmonary pressures. - Inferior vena cava: The vessel was dilated. The respirophasic diameter changes were blunted (< 50%), consistent with elevated central venous pressure.   ECG :  Reviewed by me: October 05, 2014: NSR . Nonspecific ST changes.   Assessment/Plan: 1. SBE -  Now s/p AVR The source of his endocarditis was found to be multiple dental cavities. Complete extraction of all his teeth was required before we  proceeded with aortic valve replacement. I have written a letter stating this fact that he can give to the intrinsically. In this case, the dental extractions were not elective but were an absolute necessity prior to his aortic valve replacement.   2. CAD - recent MI - s/p CABG .  He's healing up well from his coronary artery  bypass grafting.  Continue current medications.  3. Prior LV dysfunction - last echo with normalization  4. PAF - -  Holding in sinus by exam and EKG today.  He is now off the amiodarone. He's maintaining sinus rhythm.  5. Prior GI bleed - back on Eliquis - no bleeding reported.  6. Prior pneumonia  7. Malnourished  Current medicines are reviewed with the patient today.  The patient does not have concerns regarding medicines other than what has been noted above.  The following changes have been made:  See above.  Labs/ tests ordered today include:    Orders Placed This Encounter  Procedures  . EKG 12-Lead     Disposition:   Further disposition to follow.   Patient is agreeable to this plan and will call if any problems develop in the interim.     Nahser, Wonda Cheng, MD  10/05/2014 10:42 AM    [facility],  [place], [place]  [address] Pager [phone] Phone: [phone]; Fax: [phone]   [facility]  [address] [place],   [address] [phone]    Fax [phone]

## 2014-10-06 ENCOUNTER — Other Ambulatory Visit: Payer: Self-pay | Admitting: Surgery

## 2014-10-06 DIAGNOSIS — Z951 Presence of aortocoronary bypass graft: Secondary | ICD-10-CM

## 2014-10-10 ENCOUNTER — Ambulatory Visit
Admission: RE | Admit: 2014-10-10 | Discharge: 2014-10-10 | Disposition: A | Discharge status: Home / Self Care | Payer: Commercial Managed Care - HMO | Source: Ambulatory Visit | Attending: Surgery | Admitting: Surgery

## 2014-10-10 ENCOUNTER — Ambulatory Visit (INDEPENDENT_AMBULATORY_CARE_PROVIDER_SITE_OTHER): Payer: Self-pay | Admitting: Physician Assistant

## 2014-10-10 VITALS — BP 135/79 | HR 82 | Resp 20 | Ht 72.0 in | Wt 163.0 lb

## 2014-10-10 DIAGNOSIS — I358 Other nonrheumatic aortic valve disorders: Secondary | ICD-10-CM

## 2014-10-10 DIAGNOSIS — Z952 Presence of prosthetic heart valve: Secondary | ICD-10-CM

## 2014-10-10 DIAGNOSIS — Z951 Presence of aortocoronary bypass graft: Secondary | ICD-10-CM

## 2014-10-10 DIAGNOSIS — Z954 Presence of other heart-valve replacement: Secondary | ICD-10-CM

## 2014-10-10 DIAGNOSIS — I351 Nonrheumatic aortic (valve) insufficiency: Secondary | ICD-10-CM

## 2014-10-10 MED ORDER — POTASSIUM CHLORIDE ER 20 MEQ PO TBCR: 20.0000 meq | EXTENDED_RELEASE_TABLET | Freq: Every day | ORAL | Status: DC

## 2014-10-10 MED ORDER — FUROSEMIDE 40 MG PO TABS
40.0000 mg | ORAL_TABLET | Freq: Every day | ORAL | Status: DC
Start: 2014-10-10 — End: 2014-10-25

## 2014-10-10 NOTE — Progress Notes (Signed)
  HPI:  Patient returns for routine postoperative follow-up having undergone an AVR (tissue valve) and CABGx 6 on 09/01/2014 by Dr. Cyndia Bent. The patient's early postoperative recovery while in the hospital was notable for atrial fibrillation. The patient has no specific complaints at this time. He denies shortness of breath, chest pain, fever, chills, or cough.   Current Outpatient Prescriptions  Medication Sig Dispense Refill  . aspirin EC 81 MG tablet Take 1 tablet (81 mg total) by mouth daily.    . digoxin (LANOXIN) 0.125 MG tablet Take 125 mcg by mouth daily.  6  . ELIQUIS 5 MG TABS tablet Take 1 tablet (5 mg total) by mouth 2 (two) times daily. 180 tablet 3  . feeding supplement, ENSURE ENLIVE, (ENSURE ENLIVE) LIQD Take 237 mLs by mouth 2 (two) times daily between meals. 237 mL 12  . ferrous [id number] C-folic acid (TRINSICON / FOLTRIN) capsule Take 1 capsule by mouth daily with breakfast. For one month then stop. 30 capsule 0  . metoprolol (LOPRESSOR) 25 MG tablet Take 0.5 tablets (12.5 mg total) by mouth 2 (two) times daily. 60 tablet 1  . pantoprazole (PROTONIX) 40 MG tablet Take 1 tablet (40 mg total) by mouth daily. 30 tablet 0  . traMADol (ULTRAM) 50 MG tablet Take 1 tablet (50 mg total) by mouth 3 (three) times daily as needed for moderate pain. 30 tablet 0  Vital Signs: BP 135/79, HR 82, RR 20, and oxygen saturation 95% on room air  Physical Exam: CV-RRR Pulmonary-Diminished left base and right lung is clear Abdomen-Soft, non tender, bowel sounds present Extremities-No cyanosis, clubbing, or edema Wounds-Clean and dry. No signs of infection.  Diagnostic Tests: PA/LAT CXR: Mild cardiomegaly, no pulmonary venous congestion, LLL atelectasis and left pleural effusion.  Impression and Plan: Overall, Mr. Sorbo is recovering well from AVR and CABGx6. He has already seen Dr. Acie Fredrickson in follow up. There were no changes made to his medications. I gave patient a prescription  for 5 days of Lasix 40 mg orally with potassium supplement as he has a left pleural effusion on CXR. He only takes a pain pill "once in a while at night". He last took one 2 weeks ago. I have instructed him that as lon as he is not taking any narcotic, he may begin driving short distances (i.e. 30 minutes or less during the day) for one week. He may then increase his frequency and duration as tolerates. He was encouraged to participate in cardiac rehab, which he is willing to do. He was instructed to continue with sternal precautions for 3-4 more weeks. He inquired about mowing his lawn with a riding Conservation officer, nature. I asked him to wait until next week to do so. He will return to see Dr. Cyndia Bent in 2 weeks with a chest x ray. He is to see Dr. Acie Fredrickson again in September.   Nani Skillern, PA-C [facility] [phone]

## 2014-10-11 ENCOUNTER — Encounter: Payer: Self-pay | Admitting: Surgery

## 2014-10-16 ENCOUNTER — Encounter: Payer: Self-pay | Admitting: *Deleted

## 2014-10-16 NOTE — Patient Outreach (Signed)
[facility]) Care Management  10/16/2014  Joe Kennedy November 26, 1940 [id number]   Notification from Maury Dus, RN to close case as patient refused [place] Management services.  Joe Kennedy. [place], [place] Management [place] Assistant Phone: [phone] Fax: [phone]

## 2014-10-23 ENCOUNTER — Other Ambulatory Visit: Payer: Self-pay | Admitting: Surgery

## 2014-10-23 ENCOUNTER — Ambulatory Visit: Payer: Self-pay | Admitting: Urology

## 2014-10-23 ENCOUNTER — Encounter: Payer: Self-pay | Admitting: Urology

## 2014-10-23 DIAGNOSIS — I25709 Atherosclerosis of coronary artery bypass graft(s), unspecified, with unspecified angina pectoris: Secondary | ICD-10-CM

## 2014-10-25 ENCOUNTER — Ambulatory Visit
Admission: RE | Admit: 2014-10-25 | Discharge: 2014-10-25 | Disposition: A | Discharge status: Home / Self Care | Payer: Commercial Managed Care - HMO | Source: Ambulatory Visit | Attending: Surgery | Admitting: Surgery

## 2014-10-25 ENCOUNTER — Ambulatory Visit (INDEPENDENT_AMBULATORY_CARE_PROVIDER_SITE_OTHER): Payer: Self-pay | Admitting: Surgery

## 2014-10-25 ENCOUNTER — Encounter: Payer: Self-pay | Admitting: Surgery

## 2014-10-25 VITALS — BP 115/73 | HR 77 | Resp 16 | Ht 72.0 in | Wt 163.0 lb

## 2014-10-25 DIAGNOSIS — Z954 Presence of other heart-valve replacement: Secondary | ICD-10-CM

## 2014-10-25 DIAGNOSIS — I2510 Atherosclerotic heart disease of native coronary artery without angina pectoris: Secondary | ICD-10-CM

## 2014-10-25 DIAGNOSIS — Z952 Presence of prosthetic heart valve: Secondary | ICD-10-CM

## 2014-10-25 DIAGNOSIS — I351 Nonrheumatic aortic (valve) insufficiency: Secondary | ICD-10-CM

## 2014-10-25 DIAGNOSIS — I25709 Atherosclerosis of coronary artery bypass graft(s), unspecified, with unspecified angina pectoris: Secondary | ICD-10-CM

## 2014-10-25 DIAGNOSIS — Z951 Presence of aortocoronary bypass graft: Secondary | ICD-10-CM

## 2014-10-25 DIAGNOSIS — I358 Other nonrheumatic aortic valve disorders: Secondary | ICD-10-CM

## 2014-10-27 ENCOUNTER — Encounter: Payer: Self-pay | Admitting: Surgery

## 2014-10-27 NOTE — Progress Notes (Signed)
      HPI: Patient returns for routine postoperative follow-up having undergone CABG x 6 and AVR with a pericardial valve on 09/01/2014 for strep viridans aortic valve endocarditis with severe AI. The patient's early postoperative recovery while in the hospital was notable for an uncomplicated postop course. The operative cultures were all negative but the pathology of the valve did show G+ cocci. He had already received 6 weeks of antibiotics for this preop and had all of his teeth extracted.  Since hospital discharge the patient reports that he has continued to improve. He is eating well and gaining weight. He is walking without shortness of breath or chest pain.   Current Outpatient Prescriptions  Medication Sig Dispense Refill  . aspirin EC 81 MG tablet Take 1 tablet (81 mg total) by mouth daily.    . digoxin (LANOXIN) 0.125 MG tablet Take 125 mcg by mouth daily.  6  . ELIQUIS 5 MG TABS tablet Take 1 tablet (5 mg total) by mouth 2 (two) times daily. 180 tablet 3  . feeding supplement, ENSURE ENLIVE, (ENSURE ENLIVE) LIQD Take 237 mLs by mouth 2 (two) times daily between meals. 237 mL 12  . metoprolol (LOPRESSOR) 25 MG tablet Take 0.5 tablets (12.5 mg total) by mouth 2 (two) times daily. 60 tablet 1  . pantoprazole (PROTONIX) 40 MG tablet Take 1 tablet (40 mg total) by mouth daily. 30 tablet 0  . traMADol (ULTRAM) 50 MG tablet Take 1 tablet (50 mg total) by mouth 3 (three) times daily as needed for moderate pain. 30 tablet 0   No current facility-administered medications for this visit.    Physical Exam: BP 115/73 mmHg  Pulse 77  Resp 16  Ht 6' (1.829 m)  Wt 163 lb (73.936 kg)  BMI 22.10 kg/m2  SpO2 100% He looks well. Lung exam is clear. Cardiac exam shows a regular rate and rhythm with normal heart sounds. Chest incision is healing well and sternum is stable. The leg incisions are healing well and there is no peripheral edema.    Diagnostic Tests:  CLINICAL DATA: Coronary  artery disease  EXAM: CHEST 2 VIEW  COMPARISON: 10/10/2014  FINDINGS: Cardiomediastinal silhouette is stable. Status post median sternotomy. There is small left pleural effusion with left basilar atelectasis. Decreased pleural effusion from prior exam. No segmental infiltrate or pulmonary edema.  IMPRESSION: Status post median sternotomy. Small left pleural effusion decreased from prior exam. Left basilar atelectasis. No infiltrate or pulmonary edema.   Electronically Signed  By: Lahoma Crocker M.D.  On: 10/25/2014 09:57        Impression:  Overall I think he is doing well. I encouraged him to continue walking.  I told him he could drive his car but should not lift anything heavier than 10 lbs for three months postop.   Plan:  He is going to see Dr. Acie Fredrickson in September and will return to see me as needed.   Gaye Pollack, MD [facility] [phone]

## 2015-01-03 ENCOUNTER — Ambulatory Visit (INDEPENDENT_AMBULATORY_CARE_PROVIDER_SITE_OTHER): Payer: Commercial Managed Care - HMO | Admitting: Cardiovascular Disease

## 2015-01-03 ENCOUNTER — Encounter: Payer: Self-pay | Admitting: Cardiovascular Disease

## 2015-01-03 VITALS — BP 118/58 | HR 77 | Ht 72.0 in | Wt 171.5 lb

## 2015-01-03 DIAGNOSIS — I2510 Atherosclerotic heart disease of native coronary artery without angina pectoris: Secondary | ICD-10-CM

## 2015-01-03 DIAGNOSIS — I4891 Unspecified atrial fibrillation: Secondary | ICD-10-CM | POA: Diagnosis not present

## 2015-01-03 NOTE — Progress Notes (Signed)
CARDIOLOGY OFFICE NOTE  Date:  01/03/2015    Joe Kennedy Date of Birth: [birth year] Medical Record #[id number]  PCP:  Joe Miu, MD  Cardiologist:  Joe Kennedy (formally saw Dr. Clayborn Kennedy)    Chief Complaint  Patient presents with  . other    C/o sob. Meds reviewed verbally with pt.     History of Present Illness: Joe Kennedy is a 74 y.o. male who presents today for a post hospital visit.   Former patient of Dr. Etta Kennedy from [place]. He has a history of atrial fibrillation on Eliquis, CAD, HLD, chronic systolic heart failure (EF 35%), chronic anemia, and HTN.   He presented to [facility] with a syncopal episode on 07/04/2014. He eventually ruled in for NSTEMI and was transferred to [facility] for evaluation. Patient was initially admitted to the cardiology service.  He previously received his cardiology care by Dr. Clayborn Kennedy. He was hospitalized earlier in March of 2016 with angina, NSTEMI and had a cardiac catheterization, which showed two-vessel disease not amenable to intervention. Medical management was recommended.   He re-presented to [facility] on 07/04/14 with syncope while at his PCP's office. This hospitalization was further complicated by hypotension, right lower lobe pneumonia, acute hypoxic respiratory failure requiring BiPAP, acute renal failure, bacteremia ( gram + cocci), afib with RVR and NSTEMI. During his admission he developed further chest pain and his troponin was noted to be elevated and peaked around 11. His cardiologist, Dr. Clayborn Kennedy, was notified and he recommended CABG. He was transferred to [facility] for further evaluation. He was transferred on IV heparin and IV Vanc.   Patient was noted to have a viridans group streptococcal bacteremia and endocarditis. ID was consulted. Patient was placed empirically on IV vancomycin. Patient will need at least 4 weeks of IV vancomycin as per ID recommendations. He remained afebrile. WBC normalized.  Patient was seen in consultation by cardiothoracic surgery for further evaluation of endocarditis and aortic valve and severe aortic valvular regurgitation. He has had teeth extraction last week. He is to continue on IV vancomycin through 08/03/2012. Patient will follow-up with ID as outpatient.  His hospitalization was complicated by right knee pain - MRI of the right knee with tricompartmental osteoarthritis most advanced within the medial compartment. Small complex knee joint effusion no specific signs of joint infection osteomyelitis on noncontrast imaging. Extensive degenerative tearing of the medial meniscus. Anterior cruciate ligament mucoid degeneration. Treated with pain management.   Noted to have severe aortic insufficiency/aortic valvular endocarditis during his work up for his bacteremia and non-STEMI.  Patient may need a aVR/CABG. Patient has been off anticoagulation secondary to recent GI bleed and is too high risk for colonoscopy. Patient will need several weeks of IV antibiotics through 08/04/2014 per ID recommendations. Anticoagulation was resumed. Cardiothoracic surgery recommended patient be seen by nutrition for his malnutrition and continue antibiotic treatment for his endocarditis while his dental workup and treatment is done.   Patient was seen by Dr. Cyndia Kennedy who noted: The patient has strep viridans aortic valve endocarditis with severe AI presenting with a several month history of chills, poor appetite and weight loss. He also has severe TR with no apparent vegetation on that valve and the valve structurally looks good. He has moderate CAD with no significant stenosis in the LAD but a significant antero-apical wall motion abnormality on LV gram. That could be due to embolic disease from the endocarditis. The OM is occluded but small and probably not graftable.  The distal RCA has significant disease that could be grafted. He has bad teeth and will need to be seen by Dr. Lawana Kennedy and  will probably need to have the remaining teeth pulled. I will call him. He has been hemodynamically stable and tolerating the AI well so urgent surgery not indicated and I think his teeth should be dealt with first. His LV function on the recent echos looks better than the cath. He has also had this right knee pain and had a joint aspiration with only a small amount of fluid that had a negative gram stain and negative culture so far. An MR did show some osteoarthritis and extensive degenerative tearing of the medial meniscus but no specific signs of infection or osteo. He has severe malnutrition and will benefit from a period of nutrition and antibiotic treatment of his endocarditis while his dental workup and treatment is done. I will ask nutritionist to see him. I will continue to follow his progress and decide when to do surgery.  He did have a GI bleed during this last admission and was seen in consultation by gastroenterology. IV heparin was discontinued. It was felt patient was too high risk for colonoscopy and patient likely had a diverticular bleed. Patient was monitored his bleeding subsided and resolved. Patient's hemoglobin remained stable. Patient's diet was advanced and he was tolerating a solid diet by day of discharge. Patient's anticoagulation was resumed and patient was monitored for 24 hours on anticoagulation and did not have any further bleeding.   2-D echo showed an EF of 55-60% with grade 2 diastolic dysfunction.   Cath report noted to have LAD moderate disease. Left circumflex had a tight stenosis but not a good candidate for CABG. Subtotal OM. RCA had distal disease or could potentially be stented.   TEE with a EF of 60% severe aortic regurgitation, severe tricuspid regurgitation, vegetation on aortic valve.   Also with right lower lobe pneumonia and was treated on antibiotics.   He was in atrial fib - managed with rate control.  Patient received a full course of antibiotic  therapy during the hospitalization. His bleeding resolved - his hemoglobin stabilized and patient was subsequently started back on eliquis per cardiology due to concerns for increased risk for septic emboli and possible CVA .   Comes in today. Here with his wife. Seems to be holding his own. Not doing much. Pretty sedentary. No fever. No cough. Short of breath at night if he tries to lie down according to his wife - not by him - he denied being short of breath. Weight is stable. No chest pain. No bleeding. Wants to stop some medicines. Had his teeth extracted. Soft diet tolerated.   Sept. 28, 2016 Joe Kennedy is seen back today for further evaluation and management following his aortic valve replacement. Overall he is doing well. Had some heat intolerence this summer.  Has not gotten his dentures yet - needs to gain weight.   Past Medical History  Diagnosis Date  . Hypertension   . Myocardial infarction   . CHF (congestive heart failure)   . Shortness of breath dyspnea   . Arthritis     KNEES  . Heart murmur   . Pneumonia     hx 3/15  . GERD (gastroesophageal reflux disease)     occ  . Status post PICC central line placement     placed for vanco infusion rt arm  . HLD (hyperlipidemia)   . Fatigue   .  Scabies   . Obesity   . Catarrhal bronchitis   . Blood in stool   . Nocturia   . Elevated PSA   . Renal insufficiency     Past Surgical History  Procedure Laterality Date  . Tee without cardioversion N/A 07/11/2014    Procedure: TRANSESOPHAGEAL ECHOCARDIOGRAM (TEE);  Surgeon: Josue Hector, MD;  Location: [facility];  Service: Cardiovascular;  Laterality: N/A;  . Tonsillectomy    . Multiple extractions with alveoloplasty N/A 07/20/2014    Procedure: Extraction of tooth #'s 2,6,8,14,21,22,23,27,28,30 with alveoloplasty ;  Surgeon: Lenn Cal, DDS;  Location: [place];  Service: Oral Surgery;  Laterality: N/A;  . Aortic valve replacement N/A 08/31/2014    Procedure: AORTIC  VALVE REPLACEMENT (AVR);  Surgeon: Gaye Pollack, MD;  Location: [place];  Service: Open Heart Surgery;  Laterality: N/A;  . Coronary artery bypass graft N/A 08/31/2014    Procedure: CORONARY ARTERY BYPASS GRAFTING (CABG) x six, using left internal mammary artery and right leg greater saphenous vein harvested endoscopically;  Surgeon: Gaye Pollack, MD;  Location: [place];  Service: Open Heart Surgery;  Laterality: N/A;  . Tee without cardioversion N/A 08/31/2014    Procedure: TRANSESOPHAGEAL ECHOCARDIOGRAM (TEE);  Surgeon: Gaye Pollack, MD;  Location: [place];  Service: Open Heart Surgery;  Laterality: N/A;     Medications: Current Outpatient Prescriptions  Medication Sig Dispense Refill  . aspirin EC 81 MG tablet Take 1 tablet (81 mg total) by mouth daily.    . digoxin (LANOXIN) 0.125 MG tablet Take 125 mcg by mouth daily.  6  . ELIQUIS 5 MG TABS tablet Take 1 tablet (5 mg total) by mouth 2 (two) times daily. 180 tablet 3  . feeding supplement, ENSURE ENLIVE, (ENSURE ENLIVE) LIQD Take 237 mLs by mouth 2 (two) times daily between meals. (Patient taking differently: Take 237 mLs by mouth daily. ) 237 mL 12  . metoprolol (LOPRESSOR) 25 MG tablet Take 0.5 tablets (12.5 mg total) by mouth 2 (two) times daily. 60 tablet 1  . traMADol (ULTRAM) 50 MG tablet Take 1 tablet (50 mg total) by mouth 3 (three) times daily as needed for moderate pain. 30 tablet 0   No current facility-administered medications for this visit.    Allergies: Allergies  Allergen Reactions  . Lipitor [Atorvastatin] Other (See Comments)    Abdominal pain, nausea and chest pain  . Penicillins     Difficulty breathing    Social History: The patient  reports that he has never smoked. He has never used smokeless tobacco. He reports that he does not drink alcohol or use illicit drugs.   Family History: The patient's family history includes Coronary artery disease in his brother.   Review of Systems: Please see the history of  present illness.   Otherwise, the review of systems is positive for appetite change, DOE, and headaches.   All other systems are reviewed and negative.   Physical Exam: VS:  BP 118/58 mmHg  Pulse 77  Ht 6' (1.829 m)  Wt 77.792 kg (171 lb 8 oz)  BMI 23.25 kg/m2 .  BMI Body mass index is 23.25 kg/(m^2).  Wt Readings from Last 3 Encounters:  01/03/15 77.792 kg (171 lb 8 oz)  10/25/14 73.936 kg (163 lb)  10/10/14 73.936 kg (163 lb)    General: Pleasant. Looks chronically ill but in no acute distress.  HEENT: Normal. Neck: Supple, no JVD, carotid bruits, or masses noted.  Cardiac: Regular rate and  rhythm. Systolic murmur noted. Heart sounds are distant. No edema.  Respiratory:  Lungs are decreased with normal work of breathing.  GI: Soft and nontender.  MS: No deformity or atrophy. Gait and ROM intact. Skin: Warm and dry. Color is sallow. Neuro:  Strength and sensation are intact and no gross focal deficits noted.  Psych: Alert, appropriate and with normal affect.   LABORATORY DATA:  EKG:  EKG is ordered today. This demonstrates sinus brady with inferolateral T wave changes.  QT 464.    Lab Results  Component Value Date   WBC 6.4 09/05/2014   HGB 7.4* 09/05/2014   HCT 23.3* 09/05/2014   PLT 191 09/05/2014   GLUCOSE 83 09/05/2014   CHOL 82 07/07/2014   TRIG 103 07/07/2014   HDL 18* 07/07/2014   LDLCALC 43 07/07/2014   ALT 13* 08/28/2014   AST 16 08/28/2014   NA 137 09/05/2014   K 3.8 09/05/2014   CL 106 09/05/2014   CREATININE 1.25* 09/05/2014   BUN 27* 09/05/2014   CO2 25 09/05/2014   TSH 2.343 07/06/2014   INR 1.35 08/31/2014   HGBA1C 5.2 08/28/2014    BNP (last 3 results)  Recent Labs  07/06/14 1940 08/20/14 0426  BNP 779.9* 1769.0*    ProBNP (last 3 results)  Recent Labs  07/25/14 1053 08/02/14 1327  PROBNP 1471.0* 1522.0*     Other Studies Reviewed Today:  TEE Study Conclusions  - Impressions: Mild LVE EF 60% Aortic valve SBE with  small marantic like vegetations no abscess Severe AR Regurgitation largest though commisure between right and non coronary cusps. Mild MR Severe TR estimated PA pressure 74 mmHg Normla PV LAE No aortic debris  Recommend Rx SBE with 6 weeks of antibiotics and consideration for repeat TEE possible need for surgery given severe AR Future TEE;s need propofol as intubation of esophagus very difficult  [place]   TTE Study Conclusions  - Left ventricle: The cavity size was mildly dilated. There was mild concentric hypertrophy. Systolic function was normal. The estimated ejection fraction was in the range of 50% to 55%. Features are consistent with a pseudonormal left ventricular filling pattern, with concomitant abnormal relaxation and increased filling pressure (grade 2 diastolic dysfunction). - Regional wall motion abnormality: Mild hypokinesis of the mid-apical anterior, mid anterolateral, apical lateral, and apical myocardium. - Aortic valve: Trileaflet; mildly thickened, mildly calcified leaflets. There was no stenosis. There was moderate to severe regurgitation. - Mitral valve: Mildly thickened leaflets . There was mild regurgitation. - Left atrium: The atrium was mildly dilated. Volume/bsa, ES, (1-plane Simpson&'s, A2C): 30.1 ml/m^2. - Right ventricle: Systolic function was mildly reduced. - Right atrium: The atrium was mildly dilated. - Tricuspid valve: There was moderate-severe regurgitation. - Pulmonary arteries: PA peak pressure: 69 mm Hg (S). Severely elevated pulmonary pressures. - Inferior vena cava: The vessel was dilated. The respirophasic diameter changes were blunted (< 50%), consistent with elevated central venous pressure.   ECG :  Reviewed by me: Sept 28. 2016 NSR with frequent PVCs in a bigiminal pattern   ( had resolved by the time I examined him   Assessment/Plan: 1. SBE -  Now s/p AVR He is  doing much better.  Slowly recovering from his surgery   2. CAD - recent MI - s/p CABG .  He's healing up well from his coronary artery bypass grafting.  Continue current medications.  3. Prior LV dysfunction - most recent echo shows normal LV function   4. PAF - -  He's maintaining sinus rhythm.  5. Prior GI bleed - back on Eliquis - no bleeding reported.  6. Prior pneumonia  7. Malnourished  Current medicines are reviewed with the patient today.  The patient does not have concerns regarding medicines other than what has been noted above.  The following changes have been made:  See above.  Labs/ tests ordered today include:    Orders Placed This Encounter  Procedures  . EKG 12-Lead     Disposition:   Pt will follow up with Dr. Dorene Ar in our [place] office  in 6 months .     Joe Kennedy, Wonda Cheng, MD  01/03/2015 10:05 AM    [address] [place],  [place], [place]  [address] Pager [phone] Phone: [phone]; Fax: [phone]   [facility]  [address] [place], [place]  [address] [phone]    Fax [phone]

## 2015-01-03 NOTE — Patient Instructions (Signed)
Medication Instructions:  Your physician recommends that you continue on your current medications as directed. Please refer to the Current Medication list given to you today.   Labwork: none  Testing/Procedures: none  Follow-Up: Your physician wants you to follow-up in: six months. You will receive a reminder letter in the mail two months in advance. If you don't receive a letter, please call our office to schedule the follow-up appointment.   Any Other Special Instructions Will Be Listed Below (If Applicable).

## 2015-01-30 ENCOUNTER — Other Ambulatory Visit: Payer: Self-pay

## 2015-01-30 MED ORDER — ELIQUIS 5 MG PO TABS: 5.0000 mg | ORAL_TABLET | Freq: Two times a day (BID) | ORAL | Status: DC

## 2015-02-02 ENCOUNTER — Telehealth: Payer: Self-pay | Admitting: Cardiovascular Disease

## 2015-02-02 NOTE — Telephone Encounter (Signed)
Patient calling the office for samples of medication:   1.  What medication and dosage are you requesting samples for? Eliquis 5 mg po x 2 daily   2.  Are you currently out of this medication?  3 days left   3. Are you requesting samples to get you through until you receive your prescription? No, [place] with Medicare and needs help affording med   Told wife about [facility] application online  Please call when samples are ready

## 2015-02-02 NOTE — Telephone Encounter (Signed)
S/w pt who states he needs Eliquis samples.  Samples of this drug were given to the patient, quantity 2 boxes, Lot Number [id number]  Placed at front desk for pick up. Pt states he will be by today.

## 2015-02-15 ENCOUNTER — Other Ambulatory Visit: Payer: Self-pay

## 2015-03-05 ENCOUNTER — Telehealth: Payer: Self-pay | Admitting: *Deleted

## 2015-03-05 NOTE — Telephone Encounter (Signed)
Pt wife came into office needing samples  Patient calling the office for samples of medication:   1.  What medication and dosage are you requesting samples for? 5mg  eliquis   2.  Are you currently out of this medication? Pt is in [place]ut [place]

## 2015-03-05 NOTE — Telephone Encounter (Signed)
Gave samples of eliquis 5 mg Lot # [id number] Exp. 04/2017

## 2015-03-10 ENCOUNTER — Other Ambulatory Visit: Payer: Self-pay | Admitting: Family Medicine

## 2015-03-15 ENCOUNTER — Encounter: Payer: Self-pay | Admitting: Family Medicine

## 2015-03-15 ENCOUNTER — Ambulatory Visit (INDEPENDENT_AMBULATORY_CARE_PROVIDER_SITE_OTHER): Payer: Commercial Managed Care - HMO | Admitting: Family Medicine

## 2015-03-15 VITALS — BP 122/80 | HR 64 | Ht 72.0 in | Wt 179.0 lb

## 2015-03-15 DIAGNOSIS — I5022 Chronic systolic (congestive) heart failure: Secondary | ICD-10-CM

## 2015-03-15 DIAGNOSIS — R69 Illness, unspecified: Secondary | ICD-10-CM

## 2015-03-15 DIAGNOSIS — I480 Paroxysmal atrial fibrillation: Secondary | ICD-10-CM

## 2015-03-15 DIAGNOSIS — I25810 Atherosclerosis of coronary artery bypass graft(s) without angina pectoris: Secondary | ICD-10-CM | POA: Diagnosis not present

## 2015-03-15 DIAGNOSIS — Z23 Encounter for immunization: Secondary | ICD-10-CM

## 2015-03-15 DIAGNOSIS — I10 Essential (primary) hypertension: Secondary | ICD-10-CM

## 2015-03-15 MED ORDER — DIGOXIN 125 MCG PO TABS: 125.0000 ug | ORAL_TABLET | Freq: Every day | ORAL | Status: DC

## 2015-03-15 MED ORDER — METOPROLOL TARTRATE 25 MG PO TABS: 12.5000 mg | ORAL_TABLET | Freq: Two times a day (BID) | ORAL | Status: DC

## 2015-03-15 MED ORDER — ELIQUIS 5 MG PO TABS: 5.0000 mg | ORAL_TABLET | Freq: Two times a day (BID) | ORAL | Status: DC

## 2015-03-15 NOTE — Progress Notes (Signed)
Name: Joe Kennedy   MRN: [id number]    DOB: 05/09/1940   Date:03/15/2015       Progress Note  Subjective  Chief Complaint  Chief Complaint  Patient presents with  . Hypertension  . Coronary Artery Disease    Hypertension This is a chronic problem. The current episode started more than 1 year ago. The problem has been gradually improving since onset. The problem is controlled. Pertinent negatives include no anxiety, blurred vision, chest pain, headaches, malaise/fatigue, neck pain, orthopnea, palpitations, peripheral edema, PND, shortness of breath or sweats. There are no associated agents to hypertension. There are no known risk factors for coronary artery disease. Past treatments include beta blockers. The current treatment provides no improvement. There are no compliance problems.  There is no history of angina, kidney disease, CAD/MI, CVA, heart failure, left ventricular hypertrophy, PVD, renovascular disease or retinopathy. There is no history of chronic renal disease or a hypertension causing med.  Coronary Artery Disease Presents for follow-up visit. Pertinent negatives include no chest pain, chest pressure, chest tightness, dizziness, leg swelling, muscle weakness, palpitations, shortness of breath or weight gain. Risk factors include hypertension. Risk factors do not include decreased physical activity, diabetes, premature CAD in family, hyperlipidemia, obesity, stress or tobacco use. Past treatments include beta blockers. The treatment provided significant relief. Compliance with prior treatments has been good. His past medical history is significant for arrhythmia, past myocardial infarction and valvular heart disease. Past surgical history includes CABG. (Aortic valve replacement) The symptoms have been stable. Compliance with diet is good. Compliance with exercise is good. Compliance with medications is good.    No problem-specific assessment & plan notes found for this  encounter.   Past Medical History  Diagnosis Date  . Hypertension   . Myocardial infarction ([place])   . CHF (congestive heart failure) ([place])   . Shortness of breath dyspnea   . Arthritis     KNEES  . Heart murmur   . Pneumonia     hx 3/15  . GERD (gastroesophageal reflux disease)     occ  . Status post PICC central line placement     placed for vanco infusion rt arm  . HLD (hyperlipidemia)   . Fatigue   . Scabies   . Obesity   . Catarrhal bronchitis   . Blood in stool   . Nocturia   . Elevated PSA   . Renal insufficiency     Past Surgical History  Procedure Laterality Date  . Tee without cardioversion N/A 07/11/2014    Procedure: TRANSESOPHAGEAL ECHOCARDIOGRAM (TEE);  Surgeon: Josue Hector, MD;  Location[facility] ENDOSCOPY;  Service: Cardiovascular;  Laterality: N/A;  . Tonsillectomy    . Multiple extractions with alveoloplasty N/A 07/20/2014    Procedure: Extraction of tooth #'s 2,6,8,14,21,22,23,27,28,30 with alveoloplasty ;  Surgeon: Lenn Cal, DDS;  Location: [place];  Service: Oral Surgery;  Laterality: N/A;  . Aortic valve replacement N/A 08/31/2014    Procedure: AORTIC VALVE REPLACEMENT (AVR);  Surgeon: Gaye Pollack, MD;  Location: [place];  Service: Open Heart Surgery;  Laterality: N/A;  . Coronary artery bypass graft N/A 08/31/2014    Procedure: CORONARY ARTERY BYPASS GRAFTING (CABG) x six, using left internal mammary artery and right leg greater saphenous vein harvested endoscopically;  Surgeon: Gaye Pollack, MD;  Location: [place];  Service: Open Heart Surgery;  Laterality: N/A;  . Tee without cardioversion N/A 08/31/2014    Procedure: TRANSESOPHAGEAL ECHOCARDIOGRAM (TEE);  Surgeon: Fernande Boyden  Cyndia Bent, MD;  Location: [place] OR;  Service: Open Heart Surgery;  Laterality: N/A;    Family History  Problem Relation Age of Onset  . Coronary artery disease Brother     Social History   Social History  . Marital Status: Married    Spouse Name: N/A  . Number of Children: N/A   . Years of Education: N/A   Occupational History  . retired    Social History Main Topics  . Smoking status: Never Smoker   . Smokeless tobacco: Never Used  . Alcohol Use: No  . Drug Use: No  . Sexual Activity: Not Currently   Other Topics Concern  . Not on file   Social History Narrative    Allergies  Allergen Reactions  . Lipitor [Atorvastatin] Other (See Comments)    Abdominal pain, nausea and chest pain  . Penicillins     Difficulty breathing     Review of Systems  Constitutional: Negative for fever, chills, weight loss, weight gain and malaise/fatigue.  HENT: Negative for ear discharge, ear pain and sore throat.   Eyes: Negative for blurred vision.  Respiratory: Negative for cough, sputum production, chest tightness, shortness of breath and wheezing.   Cardiovascular: Negative for chest pain, palpitations, orthopnea, leg swelling and PND.  Gastrointestinal: Negative for heartburn, nausea, abdominal pain, diarrhea, constipation, blood in stool and melena.  Genitourinary: Negative for dysuria, urgency, frequency and hematuria.  Musculoskeletal: Negative for myalgias, back pain, joint pain, muscle weakness and neck pain.  Skin: Negative for rash.  Neurological: Negative for dizziness, tingling, sensory change, focal weakness and headaches.  Endo/Heme/Allergies: Negative for environmental allergies and polydipsia. Does not bruise/bleed easily.  Psychiatric/Behavioral: Negative for depression and suicidal ideas. The patient is not nervous/anxious and does not have insomnia.      Objective  Filed Vitals:   03/15/15 0805  BP: 122/80  Pulse: 64  Height: 6' (1.829 m)  Weight: 179 lb (81.194 kg)    Physical Exam  Constitutional: He is oriented to person, place, and time and well-developed, well-nourished, and in no distress.  HENT:  Head: Normocephalic.  Right Ear: External ear normal.  Left Ear: External ear normal.  Nose: Nose normal.  Mouth/Throat:  Oropharynx is clear and moist.  Eyes: Conjunctivae and EOM are normal. Pupils are equal, round, and reactive to light. Right eye exhibits no discharge. Left eye exhibits no discharge. No scleral icterus.  Neck: Normal range of motion. Neck supple. No JVD present. No tracheal deviation present. No thyromegaly present.  Cardiovascular: Normal rate, regular rhythm, normal heart sounds and intact distal pulses.  Exam reveals no gallop and no friction rub.   No murmur heard. Pulmonary/Chest: Breath sounds normal. No respiratory distress. He has no wheezes. He has no rales.  Abdominal: Soft. Bowel sounds are normal. He exhibits no mass. There is no hepatosplenomegaly. There is no tenderness. There is no rebound, no guarding and no CVA tenderness.  Musculoskeletal: Normal range of motion. He exhibits no edema or tenderness.  Lymphadenopathy:    He has no cervical adenopathy.  Neurological: He is alert and oriented to person, place, and time. He has normal sensation, normal strength, normal reflexes and intact cranial nerves. No cranial nerve deficit.  Skin: Skin is warm. No rash noted.  Psychiatric: Mood and affect normal.      Assessment & Plan  Problem List Items Addressed This Visit      Cardiovascular and Mediastinum   Atrial fibrillation ([place])   Relevant Medications  ELIQUIS 5 MG TABS tablet   metoprolol tartrate (LOPRESSOR) 25 MG tablet   digoxin (LANOXIN) 0.125 MG tablet   Chronic systolic CHF (congestive heart failure) ([facility])   Relevant Medications   ELIQUIS 5 MG TABS tablet   metoprolol tartrate (LOPRESSOR) 25 MG tablet   digoxin (LANOXIN) 0.125 MG tablet   Other Relevant Orders   Digoxin level   Hypertension   Relevant Medications   ELIQUIS 5 MG TABS tablet   metoprolol tartrate (LOPRESSOR) 25 MG tablet   digoxin (LANOXIN) 0.125 MG tablet   Other Relevant Orders   Renal Function Panel   Coronary artery disease - Primary   Relevant Medications   ELIQUIS 5 MG TABS tablet    metoprolol tartrate (LOPRESSOR) 25 MG tablet   digoxin (LANOXIN) 0.125 MG tablet    Other Visit Diagnoses    Taking multiple medications for chronic disease        Flu vaccine need        Relevant Orders    Flu Vaccine QUAD 36+ mos PF IM (Fluarix & Fluzone Quad PF) (Completed)         Dr. Deanna Jones [facility]  03/15/2015

## 2015-03-16 LAB — RENAL FUNCTION PANEL: Phosphorus: 4.4 mg/dL (ref 2.5–4.5)

## 2015-03-17 LAB — DIGOXIN LEVEL: Digoxin, Serum: 1.1 ng/mL (ref 0.9–2.0)

## 2015-03-21 ENCOUNTER — Other Ambulatory Visit: Payer: Self-pay

## 2015-03-23 ENCOUNTER — Other Ambulatory Visit: Payer: Self-pay

## 2015-04-08 ENCOUNTER — Other Ambulatory Visit: Payer: Self-pay | Admitting: Family Medicine

## 2015-10-09 ENCOUNTER — Other Ambulatory Visit: Payer: Self-pay | Admitting: Family Medicine

## 2015-10-19 ENCOUNTER — Ambulatory Visit: Payer: PPO | Admitting: Cardiovascular Disease

## 2015-10-31 ENCOUNTER — Encounter: Payer: Self-pay | Admitting: Cardiology

## 2015-10-31 ENCOUNTER — Ambulatory Visit (INDEPENDENT_AMBULATORY_CARE_PROVIDER_SITE_OTHER): Payer: PPO | Admitting: Cardiology

## 2015-10-31 VITALS — BP 130/80 | HR 58 | Ht 72.0 in | Wt 203.5 lb

## 2015-10-31 DIAGNOSIS — Z954 Presence of other heart-valve replacement: Secondary | ICD-10-CM | POA: Diagnosis not present

## 2015-10-31 DIAGNOSIS — I480 Paroxysmal atrial fibrillation: Secondary | ICD-10-CM | POA: Diagnosis not present

## 2015-10-31 DIAGNOSIS — I25810 Atherosclerosis of coronary artery bypass graft(s) without angina pectoris: Secondary | ICD-10-CM | POA: Diagnosis not present

## 2015-10-31 DIAGNOSIS — Z951 Presence of aortocoronary bypass graft: Secondary | ICD-10-CM

## 2015-10-31 DIAGNOSIS — I10 Essential (primary) hypertension: Secondary | ICD-10-CM

## 2015-10-31 DIAGNOSIS — Z952 Presence of prosthetic heart valve: Secondary | ICD-10-CM

## 2015-10-31 NOTE — Progress Notes (Signed)
Cardiology Office Note   Date:  10/31/2015   ID:  [name], DOB 01-Nov-1940, MRN [id number]  Referring Doctor:  Otilio Miu, MD   Cardiologist:   Wende Bushy, MD   Reason for consultation:  Chief Complaint  Patient presents with  . Other    Establish care for A-Fib; former patient of Dr. Julious Payer. Meds reviewed by the patient verbally. "doing well."       History of Present Illness: Joe Kennedy is a 75 y.o. male who presents for Follow-up for atrial fibrillation and coronary disease  In terms of coronary disease, he is status post CABG in May 2016. He does not have any chest pains or shortness of breath to complain about. He would occasionally have mild shortness of breath that eventually resolves after within a few minutes. No chest pain or angina.  In terms of atrial fibrillation, he feels that he has been in normal rhythm. He would be aware if he goes out of rhythm. He Is continuing to take his oral anticoagulant. He has not had issues with bleeding.  Patient denies fever, cough, colds, abdominal pain. No chest pain, shortness of breath, orthopnea, PND, edema.  ROS:  Please see the history of present illness. Aside from mentioned under HPI, all other systems are reviewed and negative.     Past Medical History:  Diagnosis Date  . Arthritis    KNEES  . Blood in stool   . Catarrhal bronchitis   . CHF (congestive heart failure) ([place])   . Elevated PSA   . Fatigue   . GERD (gastroesophageal reflux disease)    occ  . Heart murmur   . HLD (hyperlipidemia)   . Hypertension   . Myocardial infarction ([place])   . Nocturia   . Obesity   . Pneumonia    hx 3/15  . Renal insufficiency   . Scabies   . Shortness of breath dyspnea   . Status post PICC central line placement    placed for vanco infusion rt arm    Past Surgical History:  Procedure Laterality Date  . AORTIC VALVE REPLACEMENT N/A 08/31/2014   Procedure: AORTIC VALVE REPLACEMENT (AVR);  Surgeon:  Gaye Pollack, MD;  Location: [place];  Service: Open Heart Surgery;  Laterality: N/A;  . CORONARY ARTERY BYPASS GRAFT N/A 08/31/2014   Procedure: CORONARY ARTERY BYPASS GRAFTING (CABG) x six, using left internal mammary artery and right leg greater saphenous vein harvested endoscopically;  Surgeon: Gaye Pollack, MD;  Location: [place] OR;  Service: Open Heart Surgery;  Laterality: N/A;  . MULTIPLE EXTRACTIONS WITH ALVEOLOPLASTY N/A 07/20/2014   Procedure: Extraction of tooth #'s 2,6,8,14,21,22,23,27,28,30 with alveoloplasty ;  Surgeon: Lenn Cal, DDS;  Location: [place];  Service: Oral Surgery;  Laterality: N/A;  . TEE WITHOUT CARDIOVERSION N/A 07/11/2014   Procedure: TRANSESOPHAGEAL ECHOCARDIOGRAM (TEE);  Surgeon: Josue Hector, MD;  Location: [place];  Service: Cardiovascular;  Laterality: N/A;  . TEE WITHOUT CARDIOVERSION N/A 08/31/2014   Procedure: TRANSESOPHAGEAL ECHOCARDIOGRAM (TEE);  Surgeon: Gaye Pollack, MD;  Location: [place];  Service: Open Heart Surgery;  Laterality: N/A;  . TONSILLECTOMY       reports that he has never smoked. He has never used smokeless tobacco. He reports that he does not drink alcohol or use drugs.   family history includes Coronary artery disease in his brother.   Outpatient Medications Prior to Visit  Medication Sig Dispense Refill  . aspirin EC 81 MG  tablet Take 1 tablet (81 mg total) by mouth daily.    . digoxin (LANOXIN) 0.125 MG tablet Take 1 tablet (125 mcg total) by mouth daily. 30 tablet 5  . ELIQUIS 5 MG TABS tablet Take 1 tablet (5 mg total) by mouth 2 (two) times daily. 180 tablet 4  . feeding supplement, ENSURE ENLIVE, (ENSURE ENLIVE) LIQD Take 237 mLs by mouth 2 (two) times daily between meals. 237 mL 12  . metoprolol tartrate (LOPRESSOR) 25 MG tablet Take 0.5 tablets (12.5 mg total) by mouth 2 (two) times daily. 60 tablet 5  . traMADol (ULTRAM) 50 MG tablet Take 1 tablet (50 mg total) by mouth 3 (three) times daily as needed for moderate  pain. 30 tablet 0  . digoxin (LANOXIN) 0.125 MG tablet TAKE 1 TABLET BY MOUTH ONCE A DAY (Patient not taking: Reported on 10/31/2015) 30 tablet 1  . digoxin (LANOXIN) 0.125 MG tablet TAKE 1 TABLET (125 MCG TOTAL) BY MOUTH DAILY. (Patient not taking: Reported on 10/31/2015) 30 tablet 0   No facility-administered medications prior to visit.      Allergies: Lipitor [atorvastatin] and Penicillins    PHYSICAL EXAM: VS:  BP 130/80 (BP Location: Left Arm, Patient Position: Sitting, Cuff Size: Normal)   Pulse (!) 58   Ht 6' (1.829 m)   Wt 203 lb 8 oz (92.3 kg)   BMI 27.60 kg/m  , Body mass index is 27.6 kg/m. Wt Readings from Last 3 Encounters:  10/31/15 203 lb 8 oz (92.3 kg)  03/15/15 179 lb (81.2 kg)  01/03/15 171 lb 8 oz (77.8 kg)    GENERAL:  well developed, well nourished, not in acute distress HEENT: normocephalic, pink conjunctivae, anicteric sclerae, no xanthelasma, normal dentition, oropharynx clear NECK:  no neck vein engorgement, JVP normal, no hepatojugular reflux, carotid upstroke brisk and symmetric, no bruit, no thyromegaly, no lymphadenopathy LUNGS:  good respiratory effort, clear to auscultation bilaterally CV:  PMI not displaced, no thrills, no lifts, S1 and S2 within normal limits, no palpable S3 or S4, 3/6 systolic ejection murmur, no rubs, no gallops ABD:  Soft, nontender, nondistended, normoactive bowel sounds, no abdominal aortic bruit, no hepatomegaly, no splenomegaly MS: nontender back, no kyphosis, no scoliosis, no joint deformities EXT:  2+ DP/PT pulses, no edema, no varicosities, no cyanosis, no clubbing SKIN: warm, nondiaphoretic, normal turgor, no ulcers NEUROPSYCH: alert, oriented to person, place, and time, sensory/motor grossly intact, normal mood, appropriate affect  Recent Labs: No results found for requested labs within last 8760 hours.   Lipid Panel    Component Value Date/Time   CHOL 82 07/07/2014 0519   CHOL 89 07/05/2014 0435   TRIG 103  07/07/2014 0519   TRIG 114 07/05/2014 0435   HDL 18 (L) 07/07/2014 0519   HDL 19 (L) 07/05/2014 0435   CHOLHDL 4.6 07/07/2014 0519   VLDL 21 07/07/2014 0519   VLDL 23 07/05/2014 0435   LDLCALC 43 07/07/2014 0519   LDLCALC 47 07/05/2014 0435     Other studies Reviewed:  EKG:  The ekg from 10/31/2015 was personally reviewed by me and it revealed sinus bradycardia, 58 BPM, LAFB, nonspecific ST-T wave changes.  Additional studies/ records that were reviewed personally reviewed by me today include: None available ASSESSMENT AND PLAN:  CAD, status post CABG x 6 08/31/2014 AI status post AVR Continue medical therapy. Aspirin, metoprolol. Patient has significant adverse effects to statin/Lipitor. LDL was within goal from last and was checked in 2016. Recommend to check echocardiogram.  Paroxysmal atrial fibrillation Sinus rhythm currently. Heart rate is 58 BPM. Recommend to discontinue digoxin. Monitor blood pressure and heart rate. LVEF was within the normal limits from last echocardiogram May 2016. Continue oral anticoagulation with Eliquis.   Hypertension BP is well controlled. Continue monitoring BP. Continue current medical therapy and lifestyle changes.  Hyperlipidemia LDL goal is less than 70. PCP following labs. Patient unable to tolerate statin therapy.   Current medicines are reviewed at length with the patient today.  The patient does not have concerns regarding medicines.  Labs/ tests ordered today include: Orders Placed This Encounter  Procedures  . EKG 12-Lead    I had a lengthy and detailed discussion with the patient regarding diagnoses, prognosis, diagnostic options, treatment options , and side effects of medications.   I counseled the patient on importance of lifestyle modification including heart healthy diet, regular physical activity .   Disposition:   FU with undersigned in 6 months   Signed, Wende Bushy, MD  10/31/2015 2:59 PM    [place]  This note was generated in part with voice recognition software and I apologize for any typographical errors that were not detected and corrected.

## 2015-10-31 NOTE — Patient Instructions (Addendum)
Medication Instructions:  Your physician has recommended you make the following change in your medication:  1. Stop taking Digoxin  Testing/Procedures: Your physician has requested that you have an echocardiogram. Echocardiography is a painless test that uses sound waves to create images of your heart. It provides your doctor with information about the size and shape of your heart and how well your heart's chambers and valves are working. This procedure takes approximately one hour. There are no restrictions for this procedure.  Follow-Up: Your physician wants you to follow-up in: 6 months with Dr. Yvone Neu. You will receive a reminder letter in the mail two months in advance. If you don't receive a letter, please call our office to schedule the follow-up appointment.  It was a pleasure seeing you today here in the office. Please do not hesitate to give [place] a call back if you have any further questions. [place], BSN       Echocardiogram An echocardiogram, or echocardiography, uses sound waves (ultrasound) to produce an image of your heart. The echocardiogram is simple, painless, obtained within a short period of time, and offers valuable information to your health care provider. The images from an echocardiogram can provide information such as:  Evidence of coronary artery disease (CAD).  Heart size.  Heart muscle function.  Heart valve function.  Aneurysm detection.  Evidence of a past heart attack.  Fluid buildup around the heart.  Heart muscle thickening.  Assess heart valve function. LET [facility] CARE PROVIDER KNOW ABOUT:  Any allergies you have.  All medicines you are taking, including vitamins, herbs, eye drops, creams, and over-the-counter medicines.  Previous problems you or members of your family have had with the use of anesthetics.  Any blood disorders you have.  Previous surgeries you have had.  Medical conditions you  have.  Possibility of pregnancy, if this applies. BEFORE THE PROCEDURE  No special preparation is needed. Eat and drink normally.  PROCEDURE   In order to produce an image of your heart, gel will be applied to your chest and a wand-like tool (transducer) will be moved over your chest. The gel will help transmit the sound waves from the transducer. The sound waves will harmlessly bounce off your heart to allow the heart images to be captured in real-time motion. These images will then be recorded.  You may need an IV to receive a medicine that improves the quality of the pictures. AFTER THE PROCEDURE You may return to your normal schedule including diet, activities, and medicines, unless your health care provider tells you otherwise.   This information is not intended to replace advice given to you by your health care provider. Make sure you discuss any questions you have with your health care provider.   Document Released: 03/21/2000 Document Revised: 04/14/2014 Document Reviewed: 11/29/2012 Elsevier Interactive Patient Education [employer].

## 2015-11-11 ENCOUNTER — Other Ambulatory Visit: Payer: Self-pay | Admitting: Family Medicine

## 2015-11-16 ENCOUNTER — Other Ambulatory Visit: Payer: Self-pay

## 2015-11-16 ENCOUNTER — Ambulatory Visit (INDEPENDENT_AMBULATORY_CARE_PROVIDER_SITE_OTHER): Payer: PPO

## 2015-11-16 DIAGNOSIS — I25810 Atherosclerosis of coronary artery bypass graft(s) without angina pectoris: Secondary | ICD-10-CM | POA: Diagnosis not present

## 2016-03-24 ENCOUNTER — Other Ambulatory Visit: Payer: Self-pay | Admitting: Family Medicine

## 2016-03-24 DIAGNOSIS — I10 Essential (primary) hypertension: Secondary | ICD-10-CM

## 2016-03-24 DIAGNOSIS — I480 Paroxysmal atrial fibrillation: Secondary | ICD-10-CM

## 2016-04-15 ENCOUNTER — Other Ambulatory Visit: Payer: Self-pay

## 2016-04-23 ENCOUNTER — Other Ambulatory Visit: Payer: Self-pay | Admitting: Family Medicine

## 2016-04-23 DIAGNOSIS — I25810 Atherosclerosis of coronary artery bypass graft(s) without angina pectoris: Secondary | ICD-10-CM

## 2016-04-23 DIAGNOSIS — I480 Paroxysmal atrial fibrillation: Secondary | ICD-10-CM

## 2016-04-23 DIAGNOSIS — I10 Essential (primary) hypertension: Secondary | ICD-10-CM

## 2016-07-20 ENCOUNTER — Other Ambulatory Visit: Payer: Self-pay | Admitting: Family Medicine

## 2016-07-20 DIAGNOSIS — I480 Paroxysmal atrial fibrillation: Secondary | ICD-10-CM

## 2016-07-20 DIAGNOSIS — I25810 Atherosclerosis of coronary artery bypass graft(s) without angina pectoris: Secondary | ICD-10-CM

## 2016-07-31 ENCOUNTER — Encounter: Payer: Self-pay | Admitting: Cardiology

## 2016-07-31 ENCOUNTER — Ambulatory Visit (INDEPENDENT_AMBULATORY_CARE_PROVIDER_SITE_OTHER): Payer: PPO | Admitting: Cardiology

## 2016-07-31 VITALS — BP 130/70 | HR 70 | Ht 68.0 in | Wt 206.2 lb

## 2016-07-31 DIAGNOSIS — I10 Essential (primary) hypertension: Secondary | ICD-10-CM

## 2016-07-31 DIAGNOSIS — Z951 Presence of aortocoronary bypass graft: Secondary | ICD-10-CM | POA: Diagnosis not present

## 2016-07-31 DIAGNOSIS — I2510 Atherosclerotic heart disease of native coronary artery without angina pectoris: Secondary | ICD-10-CM

## 2016-07-31 DIAGNOSIS — I480 Paroxysmal atrial fibrillation: Secondary | ICD-10-CM

## 2016-07-31 DIAGNOSIS — Z952 Presence of prosthetic heart valve: Secondary | ICD-10-CM | POA: Diagnosis not present

## 2016-07-31 NOTE — Patient Instructions (Signed)
Follow-Up: Your physician wants you to follow-up in: 6 months. You will receive a reminder letter in the mail two months in advance. If you don't receive a letter, please call our office to schedule the follow-up appointment.  It was a pleasure seeing you today here in the office. Please do not hesitate to give us a call back if you have any further questions. [phone]  Marcelia Petersen A. RN, BSN     

## 2016-07-31 NOTE — Progress Notes (Signed)
Cardiology Office Note   Date:  07/31/2016   ID:  MELL Joe Kennedy, DOB 03-02-1941, MRN [id number]  Referring Doctor:  Joe Miu, MD   Cardiologist:   Joe Bushy, MD   Reason for consultation:  Chief Complaint  Patient presents with  . other    6 mo F/U. Pt c/o allergies, states he has been doing good otherwise.   Meds verbally reviewed with patient.        History of Present Illness: Joe Kennedy is a 76 y.o. male who presents forFollow-up for a determination coronary artery disease   In terms of coronary disease, he is status post CABG in May 2016. Since last visit, is been doing well. No chest pain or shortness breath. He does yard work without any symptoms.  In terms of atrial fibrillation, he has been kind with his medications. No palpitations. No significant bruising. No falls.  Patient denies PND, orthopnea, edema. No syncope.  ROS:  Please see the history of present illness. Aside from mentioned under HPI, all other systems are reviewed and negative.    Past Medical History:  Diagnosis Date  . Arthritis    KNEES  . Blood in stool   . Catarrhal bronchitis   . CHF (congestive heart failure) ([place])   . Elevated PSA   . Fatigue   . GERD (gastroesophageal reflux disease)    occ  . Heart murmur   . HLD (hyperlipidemia)   . Hypertension   . Myocardial infarction ([place])   . Nocturia   . Obesity   . Pneumonia    hx 3/15  . Renal insufficiency   . Scabies   . Shortness of breath dyspnea   . Status post PICC central line placement    placed for vanco infusion rt arm    Past Surgical History:  Procedure Laterality Date  . AORTIC VALVE REPLACEMENT N/A 08/31/2014   Procedure: AORTIC VALVE REPLACEMENT (AVR);  Surgeon: Joe Pollack, MD;  Location: [facility];  Service: Open Heart Surgery;  Laterality: N/A;  . CORONARY ARTERY BYPASS GRAFT N/A 08/31/2014   Procedure: CORONARY ARTERY BYPASS GRAFTING (CABG) x six, using left internal mammary artery and right leg  greater saphenous vein harvested endoscopically;  Surgeon: Joe Pollack, MD;  Location: [facility];  Service: Open Heart Surgery;  Laterality: N/A;  . MULTIPLE EXTRACTIONS WITH ALVEOLOPLASTY N/A 07/20/2014   Procedure: Extraction of tooth #'s 2,6,8,14,21,22,23,27,28,30 with alveoloplasty ;  Surgeon: Joe Kennedy, DDS;  Location: [facility];  Service: Oral Surgery;  Laterality: N/A;  . TEE WITHOUT CARDIOVERSION N/A 07/11/2014   Procedure: TRANSESOPHAGEAL ECHOCARDIOGRAM (TEE);  Surgeon: Joe Hector, MD;  Location: [facility];  Service: Cardiovascular;  Laterality: N/A;  . TEE WITHOUT CARDIOVERSION N/A 08/31/2014   Procedure: TRANSESOPHAGEAL ECHOCARDIOGRAM (TEE);  Surgeon: Joe Pollack, MD;  Location: [facility];  Service: Open Heart Surgery;  Laterality: N/A;  . TONSILLECTOMY       reports that he has never smoked. He has never used smokeless tobacco. He reports that he does not drink alcohol or use drugs.   family history includes Coronary artery disease in his brother.   Outpatient Medications Prior to Visit  Medication Sig Dispense Refill  . aspirin EC 81 MG tablet Take 1 tablet (81 mg total) by mouth daily.    Marland Kitchen ELIQUIS 5 MG TABS tablet TAKE 1 TABLET (5 MG TOTAL) BY MOUTH 2 (TWO) TIMES DAILY. 180 tablet 0  . feeding supplement, ENSURE ENLIVE, (ENSURE  ENLIVE) LIQD Take 237 mLs by mouth 2 (two) times daily between meals. 237 mL 12  . metoprolol tartrate (LOPRESSOR) 25 MG tablet TAKE 1/2 TABLET BY MOUTH TWICE A DAY 60 tablet 4  . traMADol (ULTRAM) 50 MG tablet Take 1 tablet (50 mg total) by mouth 3 (three) times daily as needed for moderate pain. 30 tablet 0   No facility-administered medications prior to visit.      Allergies: Lipitor [atorvastatin] and Penicillins    PHYSICAL EXAM: VS:  BP 130/70 (BP Location: Right Arm, Patient Position: Sitting, Cuff Size: Normal)   Pulse 70   Ht 5\' 8"  (1.727 m)   Wt 206 lb 4 oz (93.6 kg)   BMI 31.36 kg/m  , Body mass index is 31.36 kg/m. Wt  Readings from Last 3 Encounters:  07/31/16 206 lb 4 oz (93.6 kg)  10/31/15 203 lb 8 oz (92.3 kg)  03/15/15 179 lb (81.2 kg)    GENERAL:  well developed, well nourished, obese, not in acute distress HEENT: normocephalic, pink conjunctivae, anicteric sclerae, no xanthelasma, normal dentition, oropharynx clear NECK:  no neck vein engorgement, JVP normal, no hepatojugular reflux, carotid upstroke brisk and symmetric, no bruit, no thyromegaly, no lymphadenopathy LUNGS:  good respiratory effort, clear to auscultation bilaterally CV:  PMI not displaced, no thrills, no lifts, S1 and S2 within normal limits, no palpable S3 or S4, no murmurs, no rubs, no gallops ABD:  Soft, nontender, nondistended, normoactive bowel sounds, no abdominal aortic bruit, no hepatomegaly, no splenomegaly MS: nontender back, no kyphosis, no scoliosis, no joint deformities EXT:  2+ DP/PT pulses, no edema, no varicosities, no cyanosis, no clubbing SKIN: warm, nondiaphoretic, normal turgor, no ulcers NEUROPSYCH: alert, oriented to person, place, and time, sensory/motor grossly intact, normal mood, appropriate affect    Recent Labs: No results found for requested labs within last 8760 hours.   Lipid Panel    Component Value Date/Time   CHOL 82 07/07/2014 0519   CHOL 89 07/05/2014 0435   TRIG 103 07/07/2014 0519   TRIG 114 07/05/2014 0435   HDL 18 (L) 07/07/2014 0519   HDL 19 (L) 07/05/2014 0435   CHOLHDL 4.6 07/07/2014 0519   VLDL 21 07/07/2014 0519   VLDL 23 07/05/2014 0435   LDLCALC 43 07/07/2014 0519   LDLCALC 47 07/05/2014 0435     Other studies Reviewed:  EKG:  The ekg from 10/31/2015 was personally reviewed by me and it revealed sinus bradycardia, 58 BPM, LAFB, nonspecific ST-T wave changes.  Additional studies/ records that were reviewed personally reviewed by me today include:   Echo 11/16/2015: Left ventricle: The cavity size was mildly dilated. There was   mild concentric hypertrophy. Systolic  function was normal. The   estimated ejection fraction was in the range of 50% to 55%. Wall   motion was normal; there were no regional wall motion   abnormalities. Left ventricular diastolic function parameters   were normal. - Aortic valve: A bioprosthesis was present and functioning   normally. Mean gradient (S): 6 mm Hg. Valve area (VTI): 1.49   cm^2. Valve area (Vmax): 1.46 cm^2. - Left atrium: The atrium was moderately dilated. - Right atrium: The atrium was mildly dilated. - Pulmonary arteries: Systolic pressure was within the normal   range.  ASSESSMENT AND PLAN:  CAD, status post CABG x 6 08/31/2014 AI status post AVR Continue medical therapy, aspirin, metoprolol. Patient refuses statin therapy due to significant adverse effects. Recommend lifestyle changes.  Paroxysmal atrial fibrillation Sinus  rhythm today. Heart rate improved since discontinuing digoxin. Compliant with his anticoagulation. Patient will likely need cataract surgery as per his reporting. Maryann Alar will likely need to hold off on anticoagulation  24 hours and resume as soon as okay with ophthalmologist.  Hypertension BP is well controlled. Continue monitoring BP. Continue current medical therapy and lifestyle changes.   Hyperlipidemia Being followed by PCP. Again, patient does not want to try any another statin therapy due to significant adverse effects in the past.   Current medicines are reviewed at length with the patient today.  The patient does not have concerns regarding medicines.  Labs/ tests ordered today include:  Orders Placed This Encounter  Procedures  . EKG 12-Lead    I had a lengthy and detailed discussion with the patient regarding diagnoses, prognosis, diagnostic options, treatment options , and side effects of medications.   I counseled the patient on importance of lifestyle modification including heart healthy diet, regular physical activity .   Disposition:   FU with Cardiology  in 6 months  I spent at least 25 minutes with the patient today and more than 50% of the time was spent counseling the patient and coordinating care.    Signed, Joe Bushy, MD  07/31/2016 12:19 PM    [facility]  This note was generated in part with voice recognition software and I apologize for any typographical errors that were not detected and corrected.

## 2016-08-04 ENCOUNTER — Other Ambulatory Visit: Payer: Self-pay

## 2016-08-04 ENCOUNTER — Other Ambulatory Visit: Payer: Self-pay | Admitting: Family Medicine

## 2016-08-04 DIAGNOSIS — I480 Paroxysmal atrial fibrillation: Secondary | ICD-10-CM

## 2016-08-04 DIAGNOSIS — I25810 Atherosclerosis of coronary artery bypass graft(s) without angina pectoris: Secondary | ICD-10-CM

## 2016-08-05 MED ORDER — ELIQUIS 5 MG PO TABS: 5.0000 mg | ORAL_TABLET | Freq: Two times a day (BID) | ORAL | 3 refills | Status: DC

## 2016-08-05 NOTE — Telephone Encounter (Signed)
Please review for refill. Thanks!  

## 2017-01-27 IMAGING — CR DG CHEST 2V
2 series · 2 of 2 positions shown · non-contrast
Comparison: 07/05/2014.

CLINICAL DATA: Pneumonia.  Follow-up radiograph.

EXAM:
CHEST  2 VIEW

[chest lat]
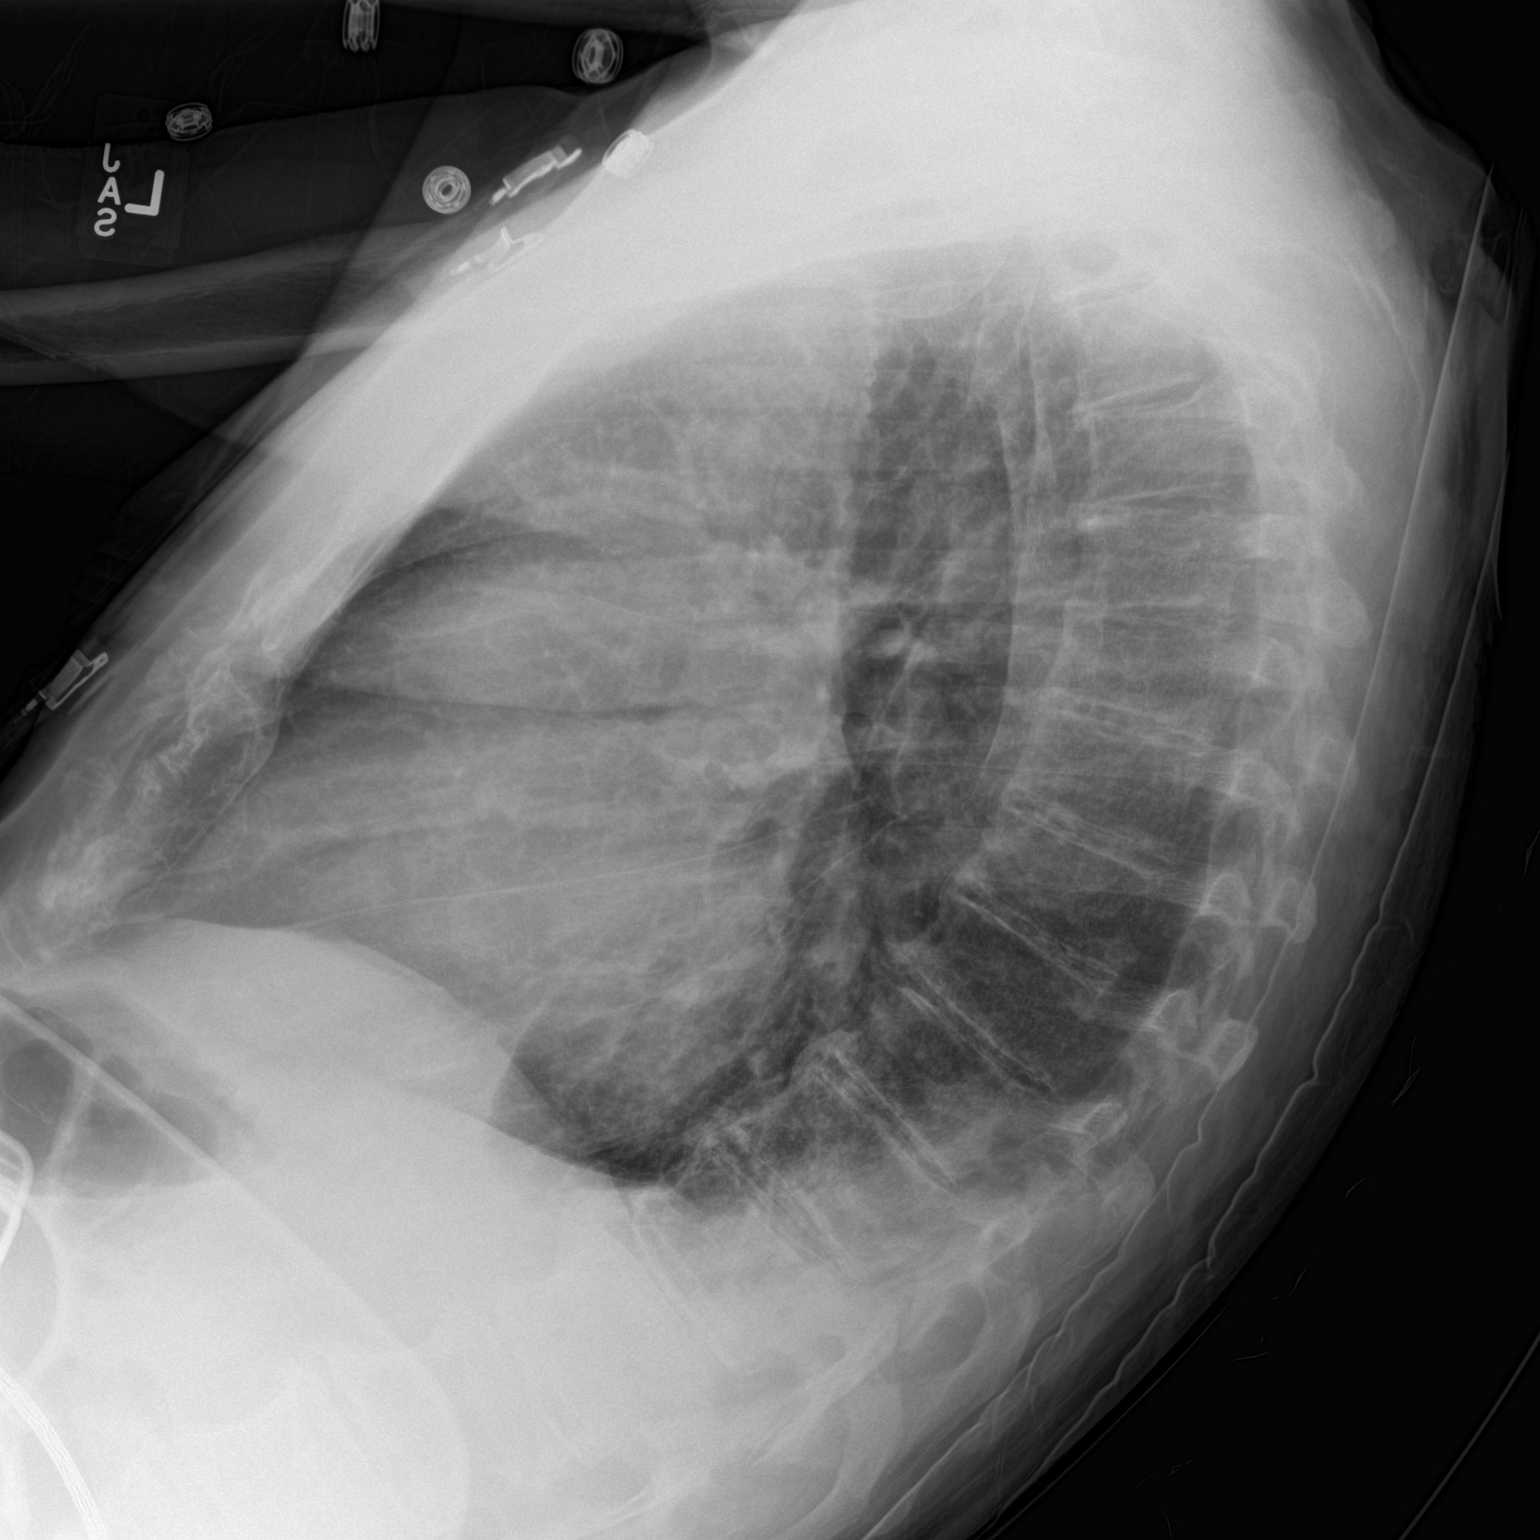

[chest ap]
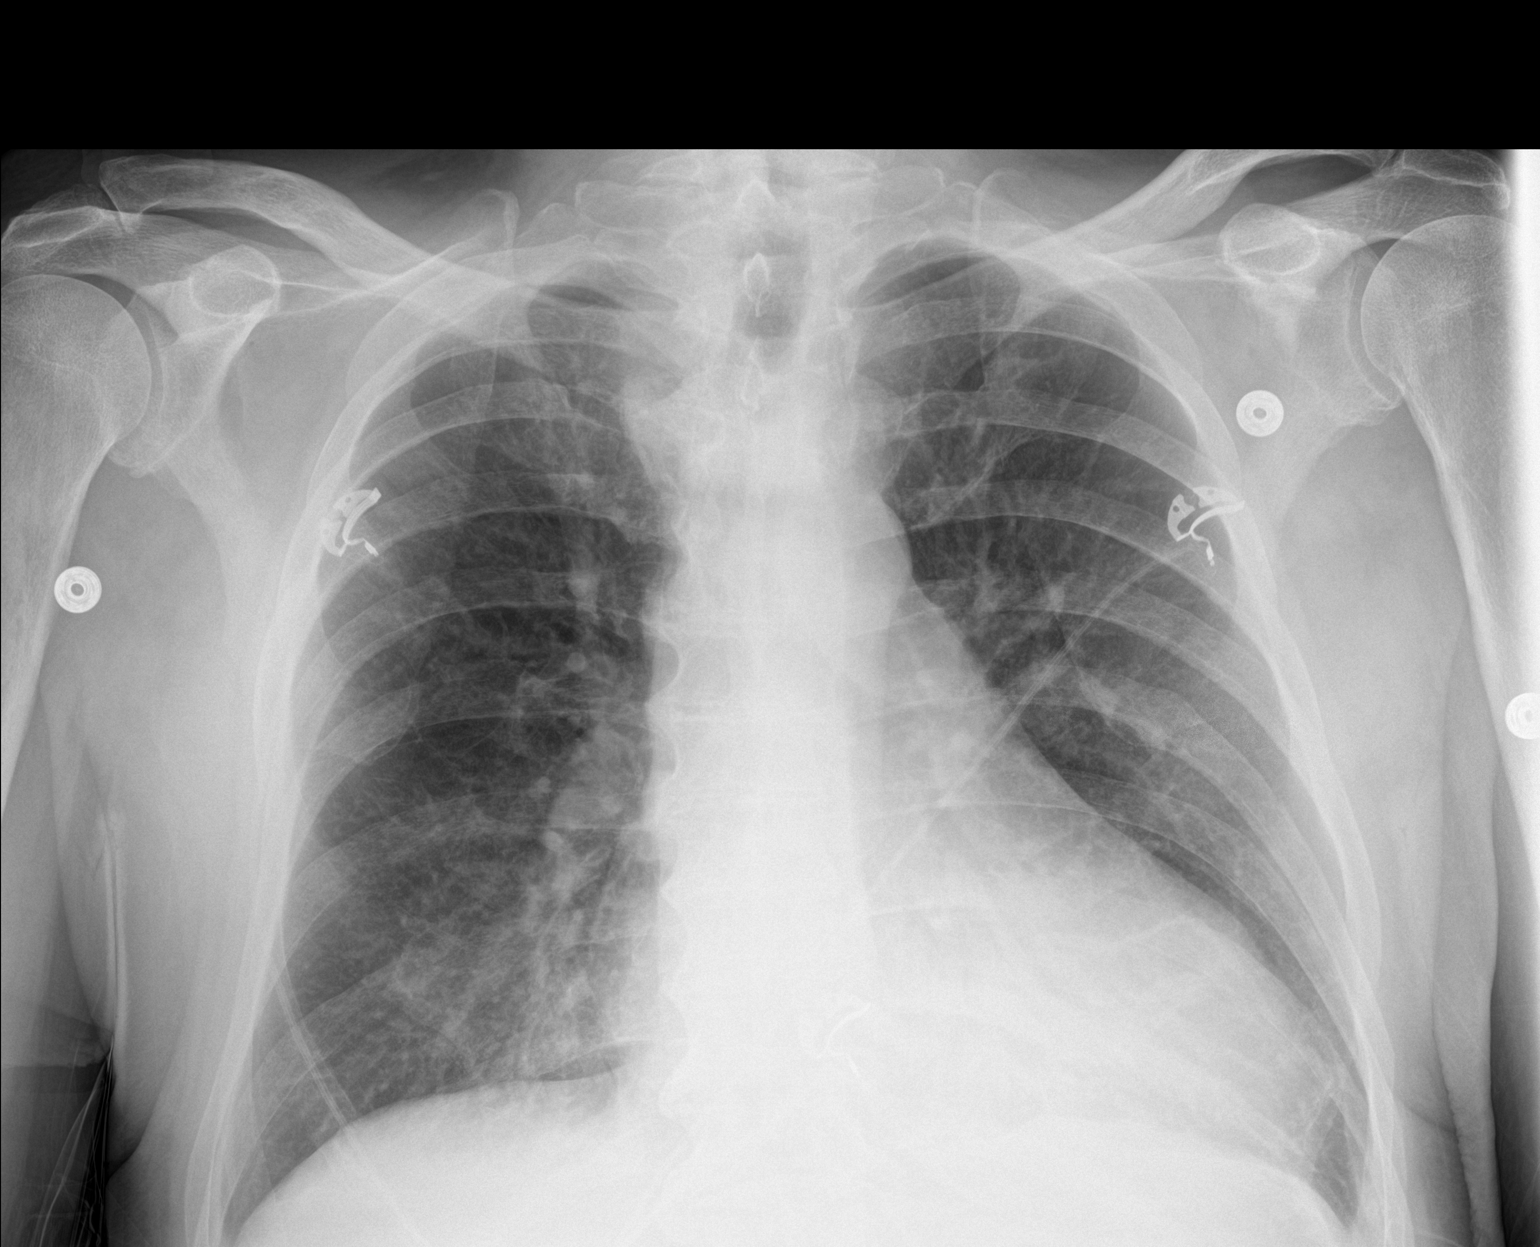

[2 of 2 positions shown; findings below may reference images not displayed]

FINDINGS: Cardiopericardial silhouette is enlarged. Improving airspace disease
is present of both lung bases. This can be seen on the frontal
projection in the retrocardiac region as well is in the RIGHT
infrahilar region. Costophrenic angles are partially excluded on the
frontal views. Small bilateral pleural effusions later
posteriorly/dependently. Monitoring leads project over the chest.
IMPRESSION: 1. Unchanged mild cardiomegaly.
2. Improving roughly symmetric medial bilateral basilar airspace
disease favored to represent either edema or aspiration pneumonitis
over pneumonia.

## 2017-02-03 DIAGNOSIS — H3122 Choroidal dystrophy (central areolar) (generalized) (peripapillary): Secondary | ICD-10-CM | POA: Diagnosis not present

## 2017-02-03 DIAGNOSIS — H3530 Unspecified macular degeneration: Secondary | ICD-10-CM | POA: Diagnosis not present

## 2017-02-03 DIAGNOSIS — H2513 Age-related nuclear cataract, bilateral: Secondary | ICD-10-CM | POA: Diagnosis not present

## 2017-02-11 DIAGNOSIS — H25811 Combined forms of age-related cataract, right eye: Secondary | ICD-10-CM | POA: Diagnosis not present

## 2017-02-11 DIAGNOSIS — H2511 Age-related nuclear cataract, right eye: Secondary | ICD-10-CM | POA: Diagnosis not present

## 2017-02-11 DIAGNOSIS — H5211 Myopia, right eye: Secondary | ICD-10-CM | POA: Diagnosis not present

## 2017-02-23 ENCOUNTER — Ambulatory Visit: Payer: PPO | Admitting: Family Medicine

## 2017-02-23 ENCOUNTER — Encounter: Payer: Self-pay | Admitting: Family Medicine

## 2017-02-23 VITALS — BP 120/78 | HR 68 | Ht 68.0 in | Wt 213.0 lb

## 2017-02-23 DIAGNOSIS — D649 Anemia, unspecified: Secondary | ICD-10-CM | POA: Diagnosis not present

## 2017-02-23 DIAGNOSIS — E785 Hyperlipidemia, unspecified: Secondary | ICD-10-CM

## 2017-02-23 DIAGNOSIS — I25810 Atherosclerosis of coronary artery bypass graft(s) without angina pectoris: Secondary | ICD-10-CM | POA: Diagnosis not present

## 2017-02-23 DIAGNOSIS — I10 Essential (primary) hypertension: Secondary | ICD-10-CM | POA: Diagnosis not present

## 2017-02-23 DIAGNOSIS — I480 Paroxysmal atrial fibrillation: Secondary | ICD-10-CM | POA: Diagnosis not present

## 2017-02-23 MED ORDER — ELIQUIS 5 MG PO TABS: 5.0000 mg | ORAL_TABLET | Freq: Two times a day (BID) | ORAL | 1 refills | Status: DC

## 2017-02-23 MED ORDER — METOPROLOL TARTRATE 25 MG PO TABS: 12.5000 mg | ORAL_TABLET | Freq: Two times a day (BID) | ORAL | 1 refills | Status: DC

## 2017-02-23 NOTE — Progress Notes (Signed)
Name: Joe Kennedy   MRN: [id number]    DOB: 1940/07/13   Date:02/23/2017       Progress Note  Subjective  Chief Complaint  Chief Complaint  Patient presents with  . Hypertension  . Coronary Artery Disease    Hypertension  This is a chronic problem. The current episode started more than 1 year ago. The problem has been gradually worsening since onset. The problem is controlled. Pertinent negatives include no anxiety, blurred vision, chest pain, headaches, malaise/fatigue, neck pain, orthopnea, palpitations, peripheral edema, PND, shortness of breath or sweats. There are no associated agents to hypertension. There are no known risk factors for coronary artery disease. Past treatments include beta blockers. The current treatment provides mild improvement. There are no compliance problems.  There is no history of angina, kidney disease, CVA, heart failure, left ventricular hypertrophy, PVD or retinopathy. There is no history of chronic renal disease, a hypertension causing med or renovascular disease.  Coronary Artery Disease  Presents for follow-up visit. Pertinent negatives include no chest pain, chest pressure, chest tightness, dizziness, leg swelling, muscle weakness, palpitations, shortness of breath or weight gain. Risk factors include hypertension. The symptoms have been stable.    No problem-specific Assessment & Plan notes found for this encounter.   Past Medical History:  Diagnosis Date  . Arthritis    KNEES  . Blood in stool   . Catarrhal bronchitis   . CHF (congestive heart failure) ([place])   . Elevated PSA   . Fatigue   . GERD (gastroesophageal reflux disease)    occ  . Heart murmur   . HLD (hyperlipidemia)   . Hypertension   . Myocardial infarction ([place])   . Nocturia   . Obesity   . Pneumonia    hx 3/15  . Renal insufficiency   . Scabies   . Shortness of breath dyspnea   . Status post PICC central line placement    placed for vanco infusion rt arm    Past  Surgical History:  Procedure Laterality Date  . AORTIC VALVE REPLACEMENT (AVR) N/A 08/31/2014   Performed by Gaye Pollack, MD at [facility] (CABG) x six, using left internal mammary artery and right leg greater saphenous vein harvested endoscopically N/A 08/31/2014   Performed by Gaye Pollack, MD at [facility]  . Extraction of tooth #'s 2,6,8,14,21,22,23,27,28,30 with alveoloplasty N/A 07/20/2014   Performed by Lenn Cal, DDS at [facility]  . TONSILLECTOMY    . TRANSESOPHAGEAL ECHOCARDIOGRAM (TEE) N/A 08/31/2014   Performed by Gaye Pollack, MD at [facility]  . TRANSESOPHAGEAL ECHOCARDIOGRAM (TEE) N/A 07/11/2014   Performed by Josue Hector, MD at [facility] ENDOSCOPY    Family History  Problem Relation Age of Onset  . Coronary artery disease Brother     Social History   Socioeconomic History  . Marital status: Married    Spouse name: Not on file  . Number of children: Not on file  . Years of education: Not on file  . Highest education level: Not on file  Social Needs  . Financial resource strain: Not on file  . Food insecurity - worry: Not on file  . Food insecurity - inability: Not on file  . Transportation needs - medical: Not on file  . Transportation needs - non-medical: Not on file  Occupational History  . Occupation: retired  Tobacco Use  . Smoking status: Never Smoker  . Smokeless tobacco: Never Used  Substance and Sexual Activity  . Alcohol use: No    Alcohol/week: 0.6 oz    Types: 1 Cans of beer per week  . Drug use: No  . Sexual activity: Not Currently  Other Topics Concern  . Not on file  Social History Narrative  . Not on file    Allergies  Allergen Reactions  . Lipitor [Atorvastatin] Other (See Comments)    Abdominal pain, nausea and chest pain  . Penicillins     Difficulty breathing    Outpatient Medications Prior to Visit  Medication Sig Dispense Refill  . aspirin EC 81 MG tablet Take 1 tablet (81 mg total) by mouth  daily.    Marland Kitchen ELIQUIS 5 MG TABS tablet Take 1 tablet (5 mg total) by mouth 2 (two) times daily. 180 tablet 3  . metoprolol tartrate (LOPRESSOR) 25 MG tablet TAKE 1/2 TABLET BY MOUTH TWICE A DAY 60 tablet 4  . feeding supplement, ENSURE ENLIVE, (ENSURE ENLIVE) LIQD Take 237 mLs by mouth 2 (two) times daily between meals. 237 mL 12  . traMADol (ULTRAM) 50 MG tablet Take 1 tablet (50 mg total) by mouth 3 (three) times daily as needed for moderate pain. 30 tablet 0   No facility-administered medications prior to visit.     Review of Systems  Constitutional: Negative for chills, fever, malaise/fatigue, weight gain and weight loss.  HENT: Negative for ear discharge, ear pain and sore throat.   Eyes: Negative for blurred vision.  Respiratory: Negative for cough, sputum production, chest tightness, shortness of breath and wheezing.   Cardiovascular: Negative for chest pain, palpitations, orthopnea, leg swelling and PND.  Gastrointestinal: Negative for abdominal pain, blood in stool, constipation, diarrhea, heartburn, melena and nausea.  Genitourinary: Negative for dysuria, frequency, hematuria and urgency.  Musculoskeletal: Negative for back pain, joint pain, myalgias, muscle weakness and neck pain.  Skin: Negative for rash.  Neurological: Negative for dizziness, tingling, sensory change, focal weakness and headaches.  Endo/Heme/Allergies: Negative for environmental allergies and polydipsia. Does not bruise/bleed easily.  Psychiatric/Behavioral: Negative for depression and suicidal ideas. The patient is not nervous/anxious and does not have insomnia.      Objective  Vitals:   02/23/17 1333  BP: 120/78  Pulse: 68  Weight: 213 lb (96.6 kg)  Height: 5\' 8"  (1.727 m)    Physical Exam  Constitutional: He is oriented to person, place, and time and well-developed, well-nourished, and in no distress.  HENT:  Head: Normocephalic.  Right Ear: External ear normal.  Left Ear: External ear normal.   Nose: Nose normal.  Mouth/Throat: Oropharynx is clear and moist.  Eyes: Conjunctivae and EOM are normal. Pupils are equal, round, and reactive to light. Right eye exhibits no discharge. Left eye exhibits no discharge. No scleral icterus.  Neck: Normal range of motion. Neck supple. No JVD present. No tracheal deviation present. No thyromegaly present.  Cardiovascular: Normal rate, regular rhythm, normal heart sounds and intact distal pulses. Exam reveals no gallop and no friction rub.  No murmur heard. Pulmonary/Chest: Breath sounds normal. No respiratory distress. He has no wheezes. He has no rales.  Abdominal: Soft. Bowel sounds are normal. He exhibits no mass. There is no hepatosplenomegaly. There is no tenderness. There is no rebound, no guarding and no CVA tenderness.  Musculoskeletal: Normal range of motion. He exhibits no edema or tenderness.  Lymphadenopathy:    He has no cervical adenopathy.  Neurological: He is alert and oriented to person, place, and time. He has normal sensation,  normal strength, normal reflexes and intact cranial nerves. No cranial nerve deficit.  Skin: Skin is warm. No rash noted.  Psychiatric: Mood and affect normal.  Nursing note and vitals reviewed.     Assessment & Plan  Problem List Items Addressed This Visit      Cardiovascular and Mediastinum   Hypertension   Relevant Medications   ELIQUIS 5 MG TABS tablet   metoprolol tartrate (LOPRESSOR) 25 MG tablet   Other Relevant Orders   Renal Function Panel   Atrial fibrillation (HCC)   Relevant Medications   ELIQUIS 5 MG TABS tablet   metoprolol tartrate (LOPRESSOR) 25 MG tablet   Coronary artery disease - Primary   Relevant Medications   ELIQUIS 5 MG TABS tablet   metoprolol tartrate (LOPRESSOR) 25 MG tablet     Other   Dyslipidemia   Relevant Orders   Lipid Profile    Other Visit Diagnoses    Anemia, unspecified type       Relevant Orders   CBC with Differential/Platelet      Meds  ordered this encounter  Medications  . ELIQUIS 5 MG TABS tablet    Sig: Take 1 tablet (5 mg total) 2 (two) times daily by mouth.    Dispense:  180 tablet    Refill:  1    Send to cardio  . metoprolol tartrate (LOPRESSOR) 25 MG tablet    Sig: Take 0.5 tablets (12.5 mg total) 2 (two) times daily by mouth.    Dispense:  90 tablet    Refill:  1      Dr. Otilio Miu [facility]  02/23/17

## 2017-02-24 DIAGNOSIS — H25812 Combined forms of age-related cataract, left eye: Secondary | ICD-10-CM | POA: Diagnosis not present

## 2017-02-24 DIAGNOSIS — H2512 Age-related nuclear cataract, left eye: Secondary | ICD-10-CM | POA: Diagnosis not present

## 2017-02-24 LAB — CBC WITH DIFFERENTIAL/PLATELET
BASOS: 2 %
Basophils Absolute: 0.1 10*3/uL (ref 0.0–0.2)
EOS (ABSOLUTE): 0.2 10*3/uL (ref 0.0–0.4)
Eos: 3 %
Hematocrit: 31.8 % — ABNORMAL LOW (ref 37.5–51.0)
Hemoglobin: 8.9 g/dL — ABNORMAL LOW (ref 13.0–17.7)
IMMATURE GRANS (ABS): 0 10*3/uL (ref 0.0–0.1)
Immature Granulocytes: 0 %
Lymphocytes Absolute: 1.6 10*3/uL (ref 0.7–3.1)
Lymphs: 22 %
MCH: 17.5 pg — AB (ref 26.6–33.0)
MCHC: 28 g/dL — ABNORMAL LOW (ref 31.5–35.7)
MCV: 62 fL — ABNORMAL LOW (ref 79–97)
MONOS ABS: 0.6 10*3/uL (ref 0.1–0.9)
Monocytes: 8 %
NEUTROS ABS: 4.8 10*3/uL (ref 1.4–7.0)
Neutrophils: 65 %
PLATELETS: 311 10*3/uL (ref 150–379)
RBC: 5.1 x10E6/uL (ref 4.14–5.80)
RDW: 19.7 % — AB (ref 12.3–15.4)
WBC: 7.4 10*3/uL (ref 3.4–10.8)

## 2017-02-24 LAB — LIPID PANEL
CHOL/HDL RATIO: 3.5 ratio (ref 0.0–5.0)
Cholesterol, Total: 136 mg/dL (ref 100–199)
HDL: 39 mg/dL — ABNORMAL LOW (ref >39)
LDL Calculated: 75 mg/dL (ref 0–99)
Triglycerides: 112 mg/dL (ref 0–149)
VLDL Cholesterol Cal: 22 mg/dL (ref 5–40)

## 2017-02-24 LAB — RENAL FUNCTION PANEL
Albumin: 4 g/dL (ref 3.5–4.8)
BUN/Creatinine Ratio: 11 (ref 10–24)
BUN: 17 mg/dL (ref 8–27)
CO2: 20 mmol/L (ref 20–29)
Calcium: 8.5 mg/dL — ABNORMAL LOW (ref 8.6–10.2)
Chloride: 106 mmol/L (ref 96–106)
Creatinine, Ser: 1.53 mg/dL — ABNORMAL HIGH (ref 0.76–1.27)
GFR calc Af Amer: 50 mL/min/{1.73_m2} — ABNORMAL LOW (ref >59)
GFR calc non Af Amer: 43 mL/min/{1.73_m2} — ABNORMAL LOW (ref >59)
Glucose: 89 mg/dL (ref 65–99)
PHOSPHORUS: 3.1 mg/dL (ref 2.5–4.5)
Potassium: 4.6 mmol/L (ref 3.5–5.2)
SODIUM: 140 mmol/L (ref 134–144)

## 2017-02-27 LAB — FERRITIN: FERRITIN: 6 ng/mL — AB (ref 30–400)

## 2017-02-27 LAB — SPECIMEN STATUS REPORT

## 2017-03-05 ENCOUNTER — Other Ambulatory Visit: Payer: Self-pay

## 2017-03-05 ENCOUNTER — Other Ambulatory Visit (INDEPENDENT_AMBULATORY_CARE_PROVIDER_SITE_OTHER): Payer: PPO

## 2017-03-05 DIAGNOSIS — D649 Anemia, unspecified: Secondary | ICD-10-CM | POA: Diagnosis not present

## 2017-03-05 LAB — HEMOCCULT GUIAC POC 1CARD (OFFICE)
Card #2 Fecal Occult Blod, POC: NEGATIVE
Card #3 Fecal Occult Blood, POC: NEGATIVE
FECAL OCCULT BLD: NEGATIVE

## 2017-03-21 IMAGING — CR DG CHEST 2V
2 series · 2 of 2 positions shown · non-contrast
Comparison: None.

CLINICAL DATA: Preoperative exam prior to cardiac surgery
08/29/2014. Severe aortic insufficiency, myocardial infarction,
hypertension.

EXAM:
CHEST  2 VIEW

[w chest pa]
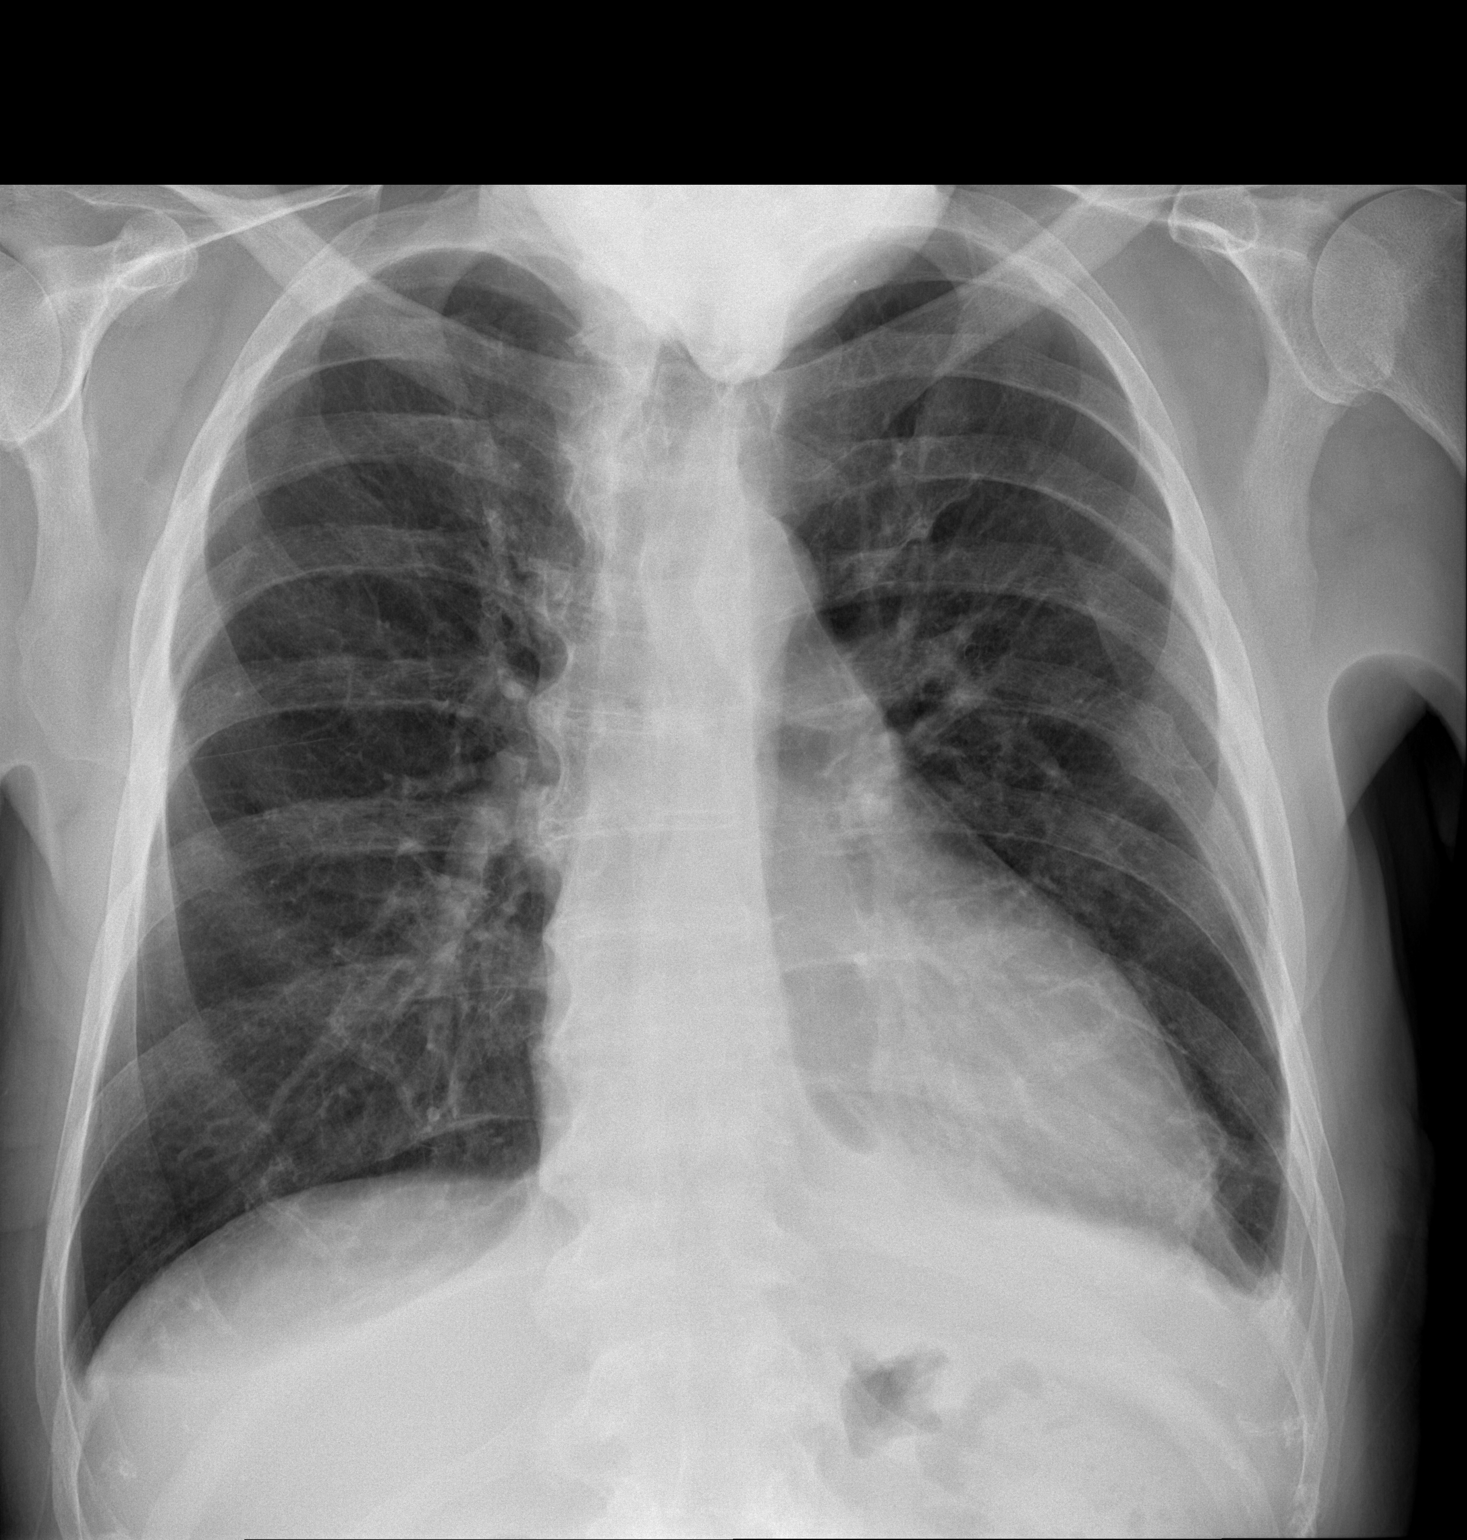

[w chest lat]
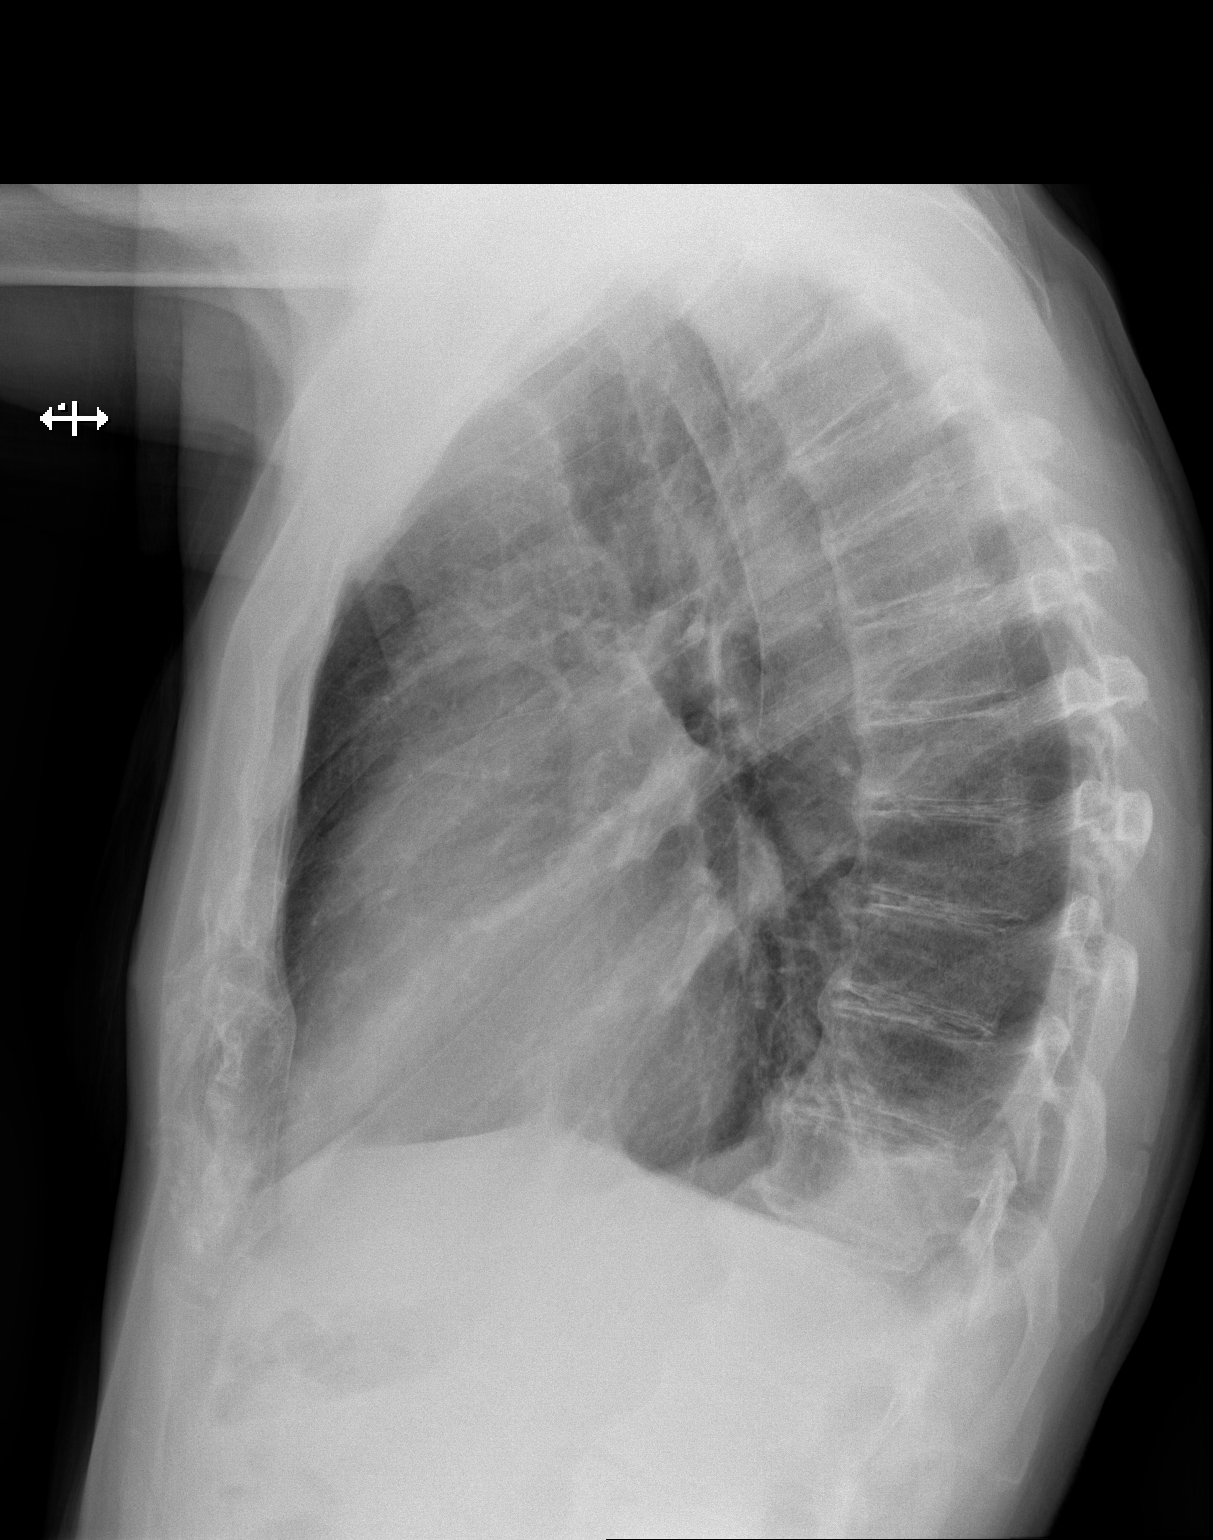

[2 of 2 positions shown; findings below may reference images not displayed]

FINDINGS: Small left pleural effusion with partial loculation noted. This
partly obscures the left lung base, with patchy left basilar opacity
identified. Hyperinflation suggesting emphysema is noted. Remote
left posterior seventh rib fracture noted. Heart size upper limits
of normal.
IMPRESSION: Small partly loculated left pleural effusion or thickening obscuring
the left lung base, with patchy left basilar opacity. This could
represent scarring or atelectasis but is not further evaluated.
Consider chest CT without contrast for further evaluation.

These results will be called to the ordering clinician or
representative by the Radiologist Assistant, and communication
documented in the PACS or zVision Dashboard.

## 2017-03-24 IMAGING — CR DG CHEST 1V PORT
1 series · 1 of 1 positions shown · non-contrast
Comparison: 08/28/2014

CLINICAL DATA: Status post aortic valve replacement

EXAM:
PORTABLE CHEST - 1 VIEW

[AP]
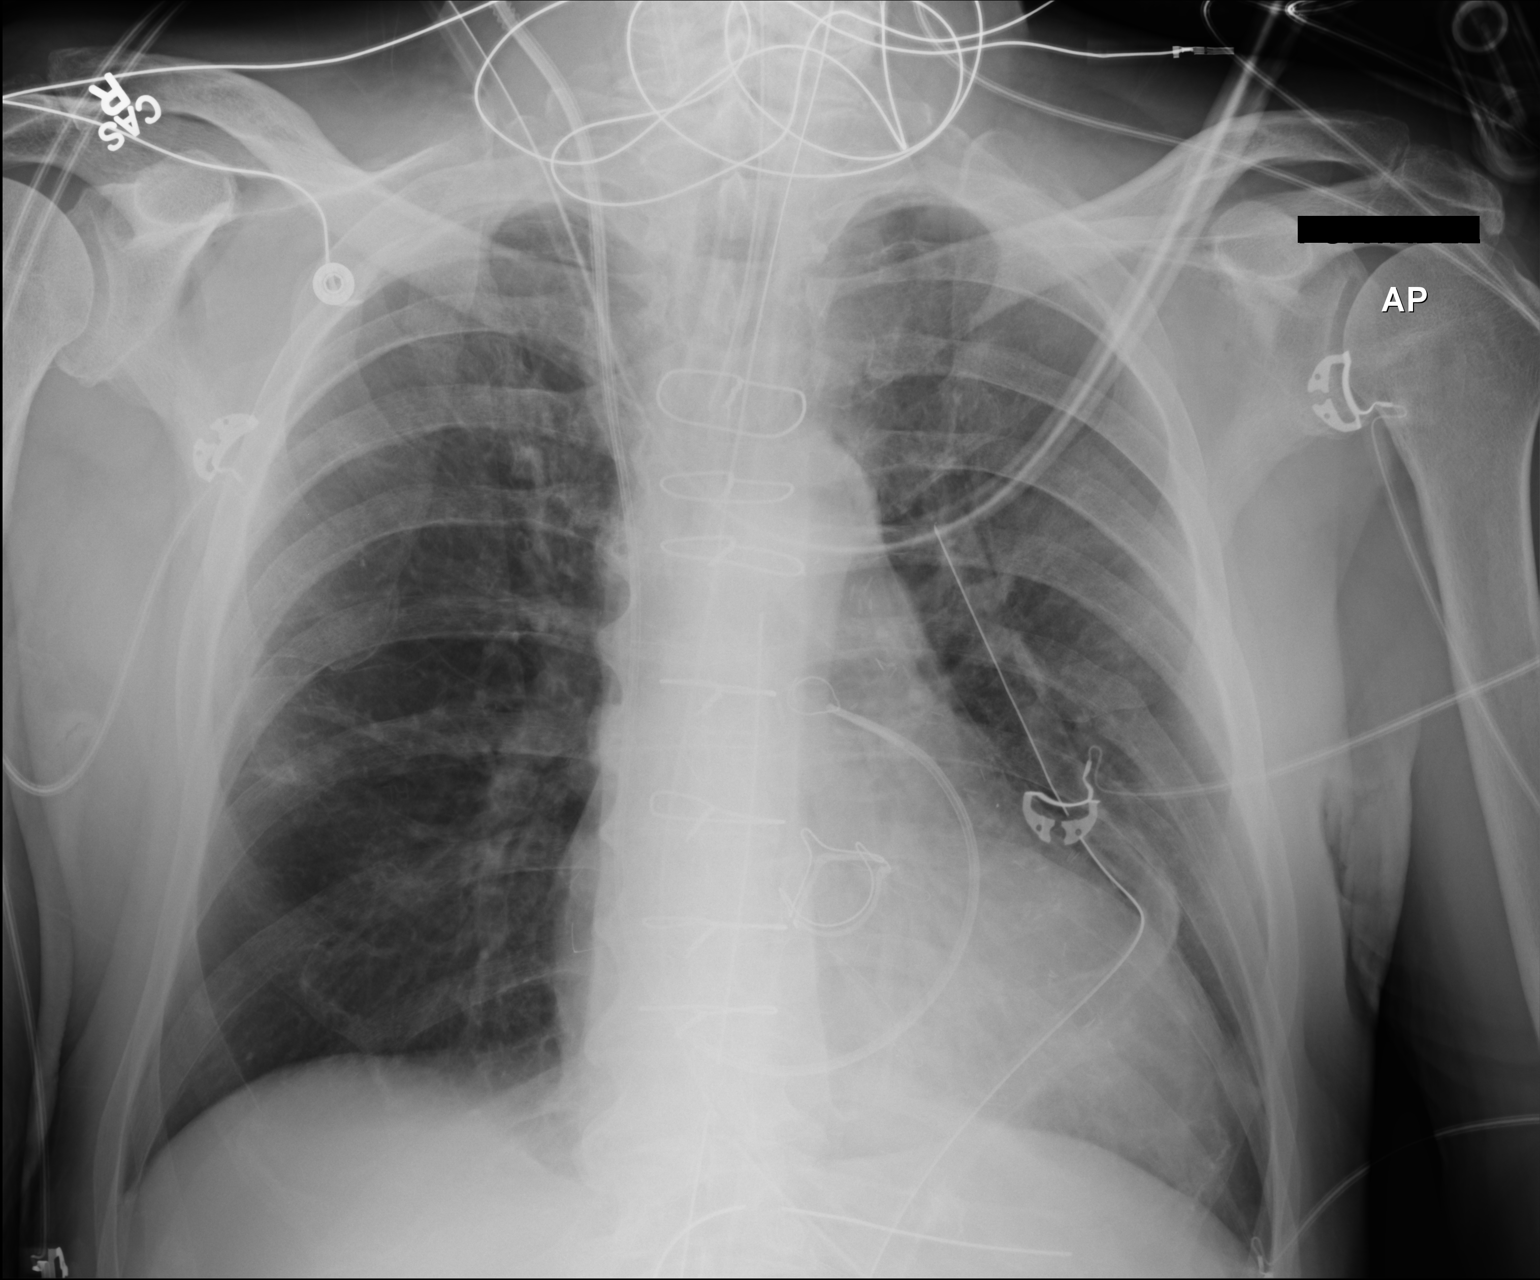

[1 of 1 positions shown; findings below may reference images not displayed]

FINDINGS: Cardiac shadow is stable. A new aortic valve is noted. Swan-Ganz
catheter is seen within the pulmonary outflow tract. A mediastinal
drain, pericardial drain and left thoracostomy catheter are noted.
Nasogastric catheter is seen coursing into the stomach. The
endotracheal tube is noted in satisfactory position approximately
4.6 cm above the carina.

The lungs are well-aerated without focal infiltrate or sizable
pneumothorax. No effusion is seen.
IMPRESSION: Postsurgical changes with tubes and lines as described. No acute
abnormality is noted.

## 2017-03-25 IMAGING — CR DG CHEST 1V PORT
1 series · 1 of 1 positions shown · non-contrast
Comparison: Portable chest x-ray September 02, 2014 and PA and lateral
chest x-ray August 28, 2014

CLINICAL DATA: Status post aortic valve replacement.

EXAM:
PORTABLE CHEST - 1 VIEW

[AP]
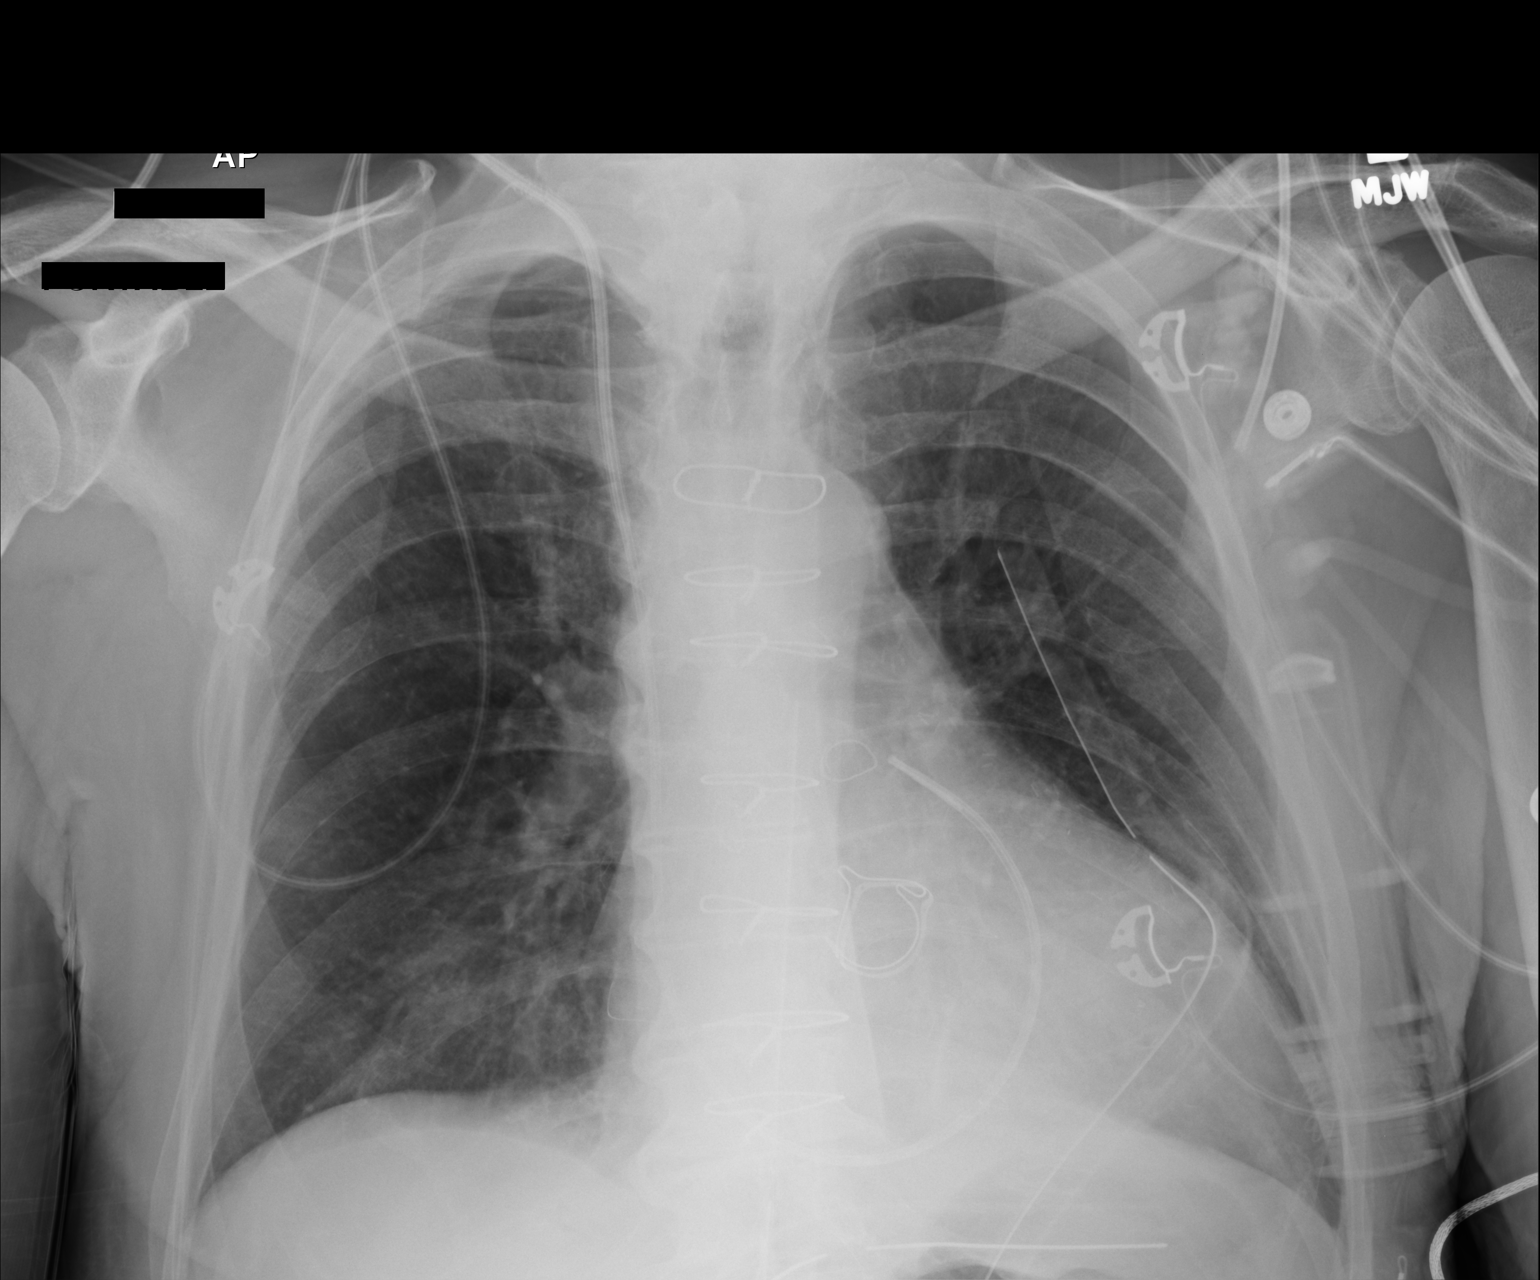

[1 of 1 positions shown; findings below may reference images not displayed]

FINDINGS: The lungs are mildly hyperinflated and exhibit minimal increased
interstitial densities at the bases. The mediastinal drain, the
pericardial drain, and the left chest tube are unchanged in
position. There is no pneumothorax or significant pleural effusion.
The Swan-Ganz catheter tip lies in the proximal right main pulmonary
artery. A valve ring in the aortic position is demonstrated. There
are post CABG changes.
IMPRESSION: The support tubes and lines are in reasonable position. Stable mild
enlargement of the cardiac silhouette. Minimal subsegmental
atelectasis at the lung bases. There is no pulmonary edema. There
has not been significant interval change in the appearance of the
chest since yesterday's study.

## 2017-08-17 ENCOUNTER — Telehealth: Payer: Self-pay

## 2017-08-17 NOTE — Telephone Encounter (Signed)
AWV w/ NHA scheduled 08/24/17

## 2017-08-24 ENCOUNTER — Ambulatory Visit (INDEPENDENT_AMBULATORY_CARE_PROVIDER_SITE_OTHER): Payer: PPO

## 2017-08-24 VITALS — BP 136/80 | HR 55 | Temp 98.3°F | Resp 12 | Ht 68.0 in | Wt 203.0 lb

## 2017-08-24 DIAGNOSIS — Z23 Encounter for immunization: Secondary | ICD-10-CM | POA: Diagnosis not present

## 2017-08-24 DIAGNOSIS — Z0000 Encounter for general adult medical examination without abnormal findings: Secondary | ICD-10-CM | POA: Diagnosis not present

## 2017-08-24 NOTE — Progress Notes (Signed)
Subjective:   Joe Kennedy is a 77 y.o. male who presents for Medicare Annual/Subsequent preventive examination.  Review of Systems:  N/A Cardiac Risk Factors include: advanced age (>71men, >56 women);dyslipidemia;hypertension;male gender;obesity (BMI >30kg/m2);sedentary lifestyle     Objective:    Vitals: BP 136/80 (BP Location: Right Arm, Patient Position: Sitting, Cuff Size: Normal)   Pulse (!) 55   Temp 98.3 F (36.8 C) (Oral)   Resp 12   Ht 5\' 8"  (1.727 m)   Wt 203 lb (92.1 kg)   SpO2 96%   BMI 30.87 kg/m   Body mass index is 30.87 kg/m.  Advanced Directives 08/24/2017 03/15/2015 09/02/2014 08/28/2014 08/20/2014 07/27/2014 07/19/2014  Does Patient Have a Medical Advance Directive? Yes Yes Yes Yes Yes Yes Yes  Type of Paramedic of [place];Living will Living will Healthcare Power of Attorney Living will;Healthcare Power of [place] will Living will  Does patient want to make changes to medical advance directive? - - No - Patient declined No - Patient declined - - No - Patient declined  Copy of [place] in Chart? Yes No - copy requested - Yes - No - copy requested Yes  Would patient like information on creating a medical advance directive? - - - - - - -    Tobacco Social History   Tobacco Use  Smoking Status Never Smoker  Smokeless Tobacco Never Used  Tobacco Comment   smoking cessation materials not required     Counseling given: No Comment: smoking cessation materials not required  Clinical Intake:  Pre-visit preparation completed: Yes  Pain : No/denies pain   BMI - recorded: 30.87 Nutritional Status: BMI > 30  Obese Nutritional Risks: None Diabetes: No  How often do you need to have someone help you when you read instructions, pamphlets, or other written materials from your doctor or pharmacy?: 1 - Never  Interpreter Needed?: No  Information entered by :: AEversole,  LPN  Past Medical History:  Diagnosis Date  . Arthritis    KNEES  . Blood in stool   . Catarrhal bronchitis   . CHF (congestive heart failure) ([place])   . Elevated PSA   . Fatigue   . GERD (gastroesophageal reflux disease)    occ  . Heart murmur   . HLD (hyperlipidemia)   . Hypertension   . Myocardial infarction ([place])   . Nocturia   . Obesity   . Pneumonia    hx 3/15  . Renal insufficiency   . Scabies   . Shortness of breath dyspnea   . Status post PICC central line placement    placed for vanco infusion rt arm   Past Surgical History:  Procedure Laterality Date  . AORTIC VALVE REPLACEMENT N/A 08/31/2014   Procedure: AORTIC VALVE REPLACEMENT (AVR);  Surgeon: Gaye Pollack, MD;  Location: [facility];  Service: Open Heart Surgery;  Laterality: N/A;  . CORONARY ARTERY BYPASS GRAFT N/A 08/31/2014   Procedure: CORONARY ARTERY BYPASS GRAFTING (CABG) x six, using left internal mammary artery and right leg greater saphenous vein harvested endoscopically;  Surgeon: Gaye Pollack, MD;  Location: [facility];  Service: Open Heart Surgery;  Laterality: N/A;  . MULTIPLE EXTRACTIONS WITH ALVEOLOPLASTY N/A 07/20/2014   Procedure: Extraction of tooth #'s 2,6,8,14,21,22,23,27,28,30 with alveoloplasty ;  Surgeon: Lenn Cal, DDS;  Location: [facility];  Service: Oral Surgery;  Laterality: N/A;  . TEE WITHOUT CARDIOVERSION N/A 07/11/2014   Procedure: TRANSESOPHAGEAL  ECHOCARDIOGRAM (TEE);  Surgeon: Josue Hector, MD;  Location: [facility];  Service: Cardiovascular;  Laterality: N/A;  . TEE WITHOUT CARDIOVERSION N/A 08/31/2014   Procedure: TRANSESOPHAGEAL ECHOCARDIOGRAM (TEE);  Surgeon: Gaye Pollack, MD;  Location: [facility];  Service: Open Heart Surgery;  Laterality: N/A;  . TONSILLECTOMY     Family History  Problem Relation Age of Onset  . Healthy Mother   . Healthy Father   . Healthy Sister   . Coronary artery disease Brother   . Healthy Sister    Social History   Socioeconomic History  . Marital  status: Married    Spouse name: Not on file  . Number of children: 5  . Years of education: Not on file  . Highest education level: 12th grade  Occupational History  . Occupation: retired  Scientific laboratory technician  . Financial resource strain: Not hard at all  . Food insecurity:    Worry: Never true    Inability: Never true  . Transportation needs:    Medical: No    Non-medical: No  Tobacco Use  . Smoking status: Never Smoker  . Smokeless tobacco: Never Used  . Tobacco comment: smoking cessation materials not required  Substance and Sexual Activity  . Alcohol use: No    Alcohol/week: 0.6 oz    Types: 1 Cans of beer per week  . Drug use: No  . Sexual activity: Not Currently  Lifestyle  . Physical activity:    Days per week: 0 days    Minutes per session: 0 min  . Stress: Not at all  Relationships  . Social connections:    Talks on phone: Patient refused    Gets together: Patient refused    Attends religious service: Patient refused    Active member of club or organization: Patient refused    Attends meetings of clubs or organizations: Patient refused    Relationship status: Married  Other Topics Concern  . Not on file  Social History Narrative  . Not on file    Outpatient Encounter Medications as of 08/24/2017  Medication Sig  . aspirin EC 81 MG tablet Take 1 tablet (81 mg total) by mouth daily.  Marland Kitchen ELIQUIS 5 MG TABS tablet Take 1 tablet (5 mg total) 2 (two) times daily by mouth.  . ferrous sulfate 325 (65 FE) MG tablet Take 325 mg by mouth daily with breakfast.  . metoprolol tartrate (LOPRESSOR) 25 MG tablet Take 0.5 tablets (12.5 mg total) 2 (two) times daily by mouth.   No facility-administered encounter medications on file as of 08/24/2017.     Activities of Daily Living In your present state of health, do you have any difficulty performing the following activities: 08/24/2017  Hearing? N  Comment denies hearing aids  Vision? N  Comment wears eyeglasses  Difficulty  concentrating or making decisions? Y  Comment short term memory loss  Walking or climbing stairs? Y  Comment knee pain  Dressing or bathing? N  Doing errands, shopping? N  Preparing Food and eating ? N  Comment denies dentures  Using the Toilet? N  In the past six months, have you accidently leaked urine? N  Do you have problems with loss of bowel control? N  Managing your Medications? N  Managing your Finances? N  Housekeeping or managing your Housekeeping? N  Some recent data might be hidden    Patient Care Team: Juline Patch, MD as PCP - General (Family Medicine)   Assessment:   This  is a routine wellness examination for [place].  Exercise Activities and Dietary recommendations Current Exercise Habits: The patient does not participate in regular exercise at present, Exercise limited by: None identified  Goals    . DIET - INCREASE WATER INTAKE     Recommend to drink at least 6-8 8oz glasses of water per day.       Fall Risk Fall Risk  08/24/2017 02/23/2017 03/15/2015  Falls in the past year? No No No  Risk for fall due to : History of fall(s);Impaired vision - -  Risk for fall due to: Comment sepsis caused falls; wears eyeglasses - -   FALL RISK PREVENTION PERTAINING TO HOME: Is your home free of loose throw rugs in walkways, pet beds, electrical cords, etc? Yes Is there adequate lighting in your home to reduce risk of falls?  Yes Are there stairs in or around your home WITH handrails? Yes  ASSISTIVE DEVICES UTILIZED TO PREVENT FALLS: Use of a cane, walker or w/c? No Grab bars in the bathroom? Yes  Shower chair or a place to sit while bathing? No An elevated toilet seat or a handicapped toilet? No  Timed Get Up and Go Performed: Yes. Pt ambulated 10 feet within 14 sec. Gait slow, steady and without the use of an assistive device. No intervention required at this time. Fall risk prevention has been discussed.  Community Resource Referral:  Pt declined my offer  to send [employer] Referral to Care Guide for a shower chair or an elevated toilet seat.  Depression Screen PHQ 2/9 Scores 08/24/2017 02/23/2017 03/15/2015  PHQ - 2 Score 0 0 0  PHQ- 9 Score 0 3 -    Cognitive Function     6CIT Screen 08/24/2017  What Year? 0 points  What month? 0 points  What time? 0 points  Count back from 20 0 points  Months in reverse 0 points  Repeat phrase 0 points  Total Score 0    Immunization History  Administered Date(s) Administered  . Influenza,inj,Quad PF,6+ Mos 03/15/2015  . Pneumococcal Conjugate-13 08/24/2017    Qualifies for Shingles Vaccine?  Yes. Due for Shingrix. Education has been provided regarding the importance of this vaccine. Pt has been advised to call his insurance company to determine his out of pocket expense. Advised he may also receive this vaccine at his local pharmacy or Health Dept. Verbalized acceptance and understanding.  Overdue for Flu vaccine. Education has been provided regarding the importance of this vaccine and advised to receive when available. Verbalized acceptance and understanding.  Due for Tdap vaccine. Education has been provided regarding the importance of this vaccine. Pt has been advised he may receive this vaccine at his local pharmacy or Health Dept. Also advised to provide a copy of his vaccination record if he chooses to receive this vaccine at his local pharmacy. Verbalized acceptance and understanding.  Screening Tests Health Maintenance  Topic Date Due  . TETANUS/TDAP  08/25/2018 (Originally 07/21/1959)  . INFLUENZA VACCINE  11/05/2017  . PNA vac Low Risk Adult (2 of 2 - PPSV23) 08/25/2018   Cancer Screenings: Lung: Low Dose CT Chest recommended if Age 34-80 years, 30 pack-year currently smoking OR have quit w/in 15years. Patient does not qualify. Colorectal: No longer required  Additional Screenings: Hepatitis C Screening: Does not qualify     Plan:  I have personally reviewed and  addressed the Medicare Annual Wellness questionnaire and have noted the following in the patient's chart:  A. Medical and social  history B. Use of alcohol, tobacco or illicit drugs  C. Current medications and supplements D. Functional ability and status E.  Nutritional status F.  Physical activity G. Advance directives H. List of other physicians I.  Hospitalizations, surgeries, and ER visits in previous 12 months J.  [place] such as hearing and vision if needed, cognitive and depression L. Referrals and appointments  In addition, I have reviewed and discussed with patient certain preventive protocols, quality metrics, and best practice recommendations. A written personalized care plan for preventive services as well as general preventive health recommendations were provided to patient.  Signed,  Aleatha Borer, LPN Nurse Health Advisor  MD Recommendations: Due for Shingrix. Education has been provided regarding the importance of this vaccine. Pt has been advised to call his insurance company to determine his out of pocket expense. Advised he may also receive this vaccine at his local pharmacy or Health Dept. Verbalized acceptance and understanding.  Overdue for Flu vaccine. Education has been provided regarding the importance of this vaccine and advised to receive when available. Verbalized acceptance and understanding.  Due for Tdap vaccine. Education has been provided regarding the importance of this vaccine. Pt has been advised he may receive this vaccine at his local pharmacy or Health Dept. Also advised to provide a copy of his vaccination record if he chooses to receive this vaccine at his local pharmacy. Verbalized acceptance and understanding.

## 2017-08-24 NOTE — Patient Instructions (Signed)
Joe Kennedy , Thank you for taking time to come for your Medicare Wellness Visit. I appreciate your ongoing commitment to your health goals. Please review the following plan we discussed and let me know if I can assist you in the future.   Screening recommendations/referrals: Colorectal Screening: No longer required  Vision and Dental Exams: Recommended annual ophthalmology exams for early detection of glaucoma and other disorders of the eye Recommended annual dental exams for proper oral hygiene  Vaccinations: Influenza vaccine: Overdue Pneumococcal vaccine: Completed 1 of 2 today Tdap vaccine: Declined. Please call your insurance company to determine your out of pocket expense. You may also receive this vaccine at your local pharmacy or Health Dept. Shingles vaccine: Please call your insurance company to determine your out of pocket expense for the Shingrix vaccine. You may also receive this vaccine at your local pharmacy or Health Dept.  Advanced directives: We have received a copy of your POA (Power of [place]) and/or Living Will. These documents can be located in your chart.  Conditions/risks identified: Recommend to drink at least 6-8 8oz glasses of water per day.  Next appointment: Please schedule your Annual Wellness Visit with your Nurse Health Advisor in one year.  Please schedule an appointment with Dr. Ronnald Ramp within the next 30 days for follow up after today's Annual Wellness Visit.  Preventive Care 8 Years and Older, Male Preventive care refers to lifestyle choices and visits with your health care provider that can promote health and wellness. What does preventive care include?  A yearly physical exam. This is also called an annual well check.  Dental exams once or twice a year.  Routine eye exams. Ask your health care provider how often you should have your eyes checked.  Personal lifestyle choices, including:  Daily care of your teeth and gums.  Regular physical  activity.  Eating a healthy diet.  Avoiding tobacco and drug use.  Limiting alcohol use.  Practicing safe sex.  Taking low doses of aspirin every day.  Taking vitamin and mineral supplements as recommended by your health care provider. What happens during an annual well check? The services and screenings done by your health care provider during your annual well check will depend on your age, overall health, lifestyle risk factors, and family history of disease. Counseling  Your health care provider may ask you questions about your:  Alcohol use.  Tobacco use.  Drug use.  Emotional well-being.  Home and relationship well-being.  Sexual activity.  Eating habits.  History of falls.  Memory and ability to understand (cognition).  Work and work Statistician. Screening  You may have the following tests or measurements:  Height, weight, and BMI.  Blood pressure.  Lipid and cholesterol levels. These may be checked every 5 years, or more frequently if you are over 70 years old.  Skin check.  Lung cancer screening. You may have this screening every year starting at age 47 if you have a 30-pack-year history of smoking and currently smoke or have quit within the past 15 years.  Fecal occult blood test (FOBT) of the stool. You may have this test every year starting at age 86.  Flexible sigmoidoscopy or colonoscopy. You may have a sigmoidoscopy every 5 years or a colonoscopy every 10 years starting at age 25.  Prostate cancer screening. Recommendations will vary depending on your family history and other risks.  Hepatitis C blood test.  Hepatitis B blood test.  Sexually transmitted disease (STD) testing.  Diabetes screening. This  is done by checking your blood sugar (glucose) after you have not eaten for a while (fasting). You may have this done every 1-3 years.  Abdominal aortic aneurysm (AAA) screening. You may need this if you are a current or former  smoker.  Osteoporosis. You may be screened starting at age 51 if you are at high risk. Talk with your health care provider about your test results, treatment options, and if necessary, the need for more tests. Vaccines  Your health care provider may recommend certain vaccines, such as:  Influenza vaccine. This is recommended every year.  Tetanus, diphtheria, and acellular pertussis (Tdap, Td) vaccine. You may need a Td booster every 10 years.  Zoster vaccine. You may need this after age 100.  Pneumococcal 13-valent conjugate (PCV13) vaccine. One dose is recommended after age 22.  Pneumococcal polysaccharide (PPSV23) vaccine. One dose is recommended after age 37. Talk to your health care provider about which screenings and vaccines you need and how often you need them. This information is not intended to replace advice given to you by your health care provider. Make sure you discuss any questions you have with your health care provider. Document Released: 04/20/2015 Document Revised: 12/12/2015 Document Reviewed: 01/23/2015 Elsevier Interactive Patient Education  2017 [employer] Prevention in the Home Falls can cause injuries. They can happen to people of all ages. There are many things you can do to make your home safe and to help prevent falls. What can I do on the outside of my home?  Regularly fix the edges of walkways and driveways and fix any cracks.  Remove anything that might make you trip as you walk through a door, such as a raised step or threshold.  Trim any bushes or trees on the path to your home.  Use bright outdoor lighting.  Clear any walking paths of anything that might make someone trip, such as rocks or tools.  Regularly check to see if handrails are loose or broken. Make sure that both sides of any steps have handrails.  Any raised decks and porches should have guardrails on the edges.  Have any leaves, snow, or ice cleared regularly.  Use sand or  salt on walking paths during winter.  Clean up any spills in your garage right away. This includes oil or grease spills. What can I do in the bathroom?  Use night lights.  Install grab bars by the toilet and in the tub and shower. Do not use towel bars as grab bars.  Use non-skid mats or decals in the tub or shower.  If you need to sit down in the shower, use a plastic, non-slip stool.  Keep the floor dry. Clean up any water that spills on the floor as soon as it happens.  Remove soap buildup in the tub or shower regularly.  Attach bath mats securely with double-sided non-slip rug tape.  Do not have throw rugs and other things on the floor that can make you trip. What can I do in the bedroom?  Use night lights.  Make sure that you have a light by your bed that is easy to reach.  Do not use any sheets or blankets that are too big for your bed. They should not hang down onto the floor.  Have a firm chair that has side arms. You can use this for support while you get dressed.  Do not have throw rugs and other things on the floor that can make you trip.  What can I do in the kitchen?  Clean up any spills right away.  Avoid walking on wet floors.  Keep items that you use a lot in easy-to-reach places.  If you need to reach something above you, use a strong step stool that has a grab bar.  Keep electrical cords out of the way.  Do not use floor polish or wax that makes floors slippery. If you must use wax, use non-skid floor wax.  Do not have throw rugs and other things on the floor that can make you trip. What can I do with my stairs?  Do not leave any items on the stairs.  Make sure that there are handrails on both sides of the stairs and use them. Fix handrails that are broken or loose. Make sure that handrails are as long as the stairways.  Check any carpeting to make sure that it is firmly attached to the stairs. Fix any carpet that is loose or worn.  Avoid having  throw rugs at the top or bottom of the stairs. If you do have throw rugs, attach them to the floor with carpet tape.  Make sure that you have a light switch at the top of the stairs and the bottom of the stairs. If you do not have them, ask someone to add them for you. What else can I do to help prevent falls?  Wear shoes that:  Do not have high heels.  Have rubber bottoms.  Are comfortable and fit you well.  Are closed at the toe. Do not wear sandals.  If you use a stepladder:  Make sure that it is fully opened. Do not climb a closed stepladder.  Make sure that both sides of the stepladder are locked into place.  Ask someone to hold it for you, if possible.  Clearly mark and make sure that you can see:  Any grab bars or handrails.  First and last steps.  Where the edge of each step is.  Use tools that help you move around (mobility aids) if they are needed. These include:  Canes.  Walkers.  Scooters.  Crutches.  Turn on the lights when you go into a dark area. Replace any light bulbs as soon as they burn out.  Set up your furniture so you have a clear path. Avoid moving your furniture around.  If any of your floors are uneven, fix them.  If there are any pets around you, be aware of where they are.  Review your medicines with your doctor. Some medicines can make you feel dizzy. This can increase your chance of falling. Ask your doctor what other things that you can do to help prevent falls. This information is not intended to replace advice given to you by your health care provider. Make sure you discuss any questions you have with your health care provider. Document Released: 01/18/2009 Document Revised: 08/30/2015 Document Reviewed: 04/28/2014 Elsevier Interactive Patient Education  2017 [employer].

## 2017-09-29 ENCOUNTER — Other Ambulatory Visit: Payer: Self-pay

## 2017-09-29 DIAGNOSIS — I25810 Atherosclerosis of coronary artery bypass graft(s) without angina pectoris: Secondary | ICD-10-CM

## 2017-09-29 DIAGNOSIS — I480 Paroxysmal atrial fibrillation: Secondary | ICD-10-CM

## 2017-09-29 NOTE — Telephone Encounter (Signed)
Please review for refill on Eliquis 5 mg take one tablet twice a day.  Please verify with patient the correct pharmacy since the last refill was sent to [facility], [place].

## 2017-09-30 MED ORDER — ELIQUIS 5 MG PO TABS: 5.0000 mg | ORAL_TABLET | Freq: Two times a day (BID) | ORAL | 0 refills | Status: DC

## 2017-10-25 ENCOUNTER — Other Ambulatory Visit: Payer: Self-pay | Admitting: Internal Medicine

## 2017-10-25 DIAGNOSIS — I480 Paroxysmal atrial fibrillation: Secondary | ICD-10-CM

## 2017-10-25 DIAGNOSIS — I25810 Atherosclerosis of coronary artery bypass graft(s) without angina pectoris: Secondary | ICD-10-CM

## 2017-10-26 ENCOUNTER — Other Ambulatory Visit: Payer: Self-pay | Admitting: Family Medicine

## 2017-10-26 DIAGNOSIS — I10 Essential (primary) hypertension: Secondary | ICD-10-CM

## 2017-10-26 DIAGNOSIS — I480 Paroxysmal atrial fibrillation: Secondary | ICD-10-CM

## 2017-10-26 NOTE — Telephone Encounter (Signed)
Please review for refill, Thanks !  

## 2017-11-21 ENCOUNTER — Other Ambulatory Visit: Payer: Self-pay | Admitting: Internal Medicine

## 2017-11-21 DIAGNOSIS — I480 Paroxysmal atrial fibrillation: Secondary | ICD-10-CM

## 2017-11-21 DIAGNOSIS — I25810 Atherosclerosis of coronary artery bypass graft(s) without angina pectoris: Secondary | ICD-10-CM

## 2017-11-23 NOTE — Progress Notes (Signed)
Received refill request for Eliquis 5 mg BID.  Last month sent in #60 w/ 0 refills w/ note to pharmacy to have pt contact office for appt, as he is overdue, last saw Dr. Yvone Neu in 07/2016.  Today sent in #30 w/ 0 refiills.  Pt will need to est care w/ another cardiologist in our office before we can provide him w/ refills.  Will also need BMET to assess kidney function.

## 2017-11-23 NOTE — Telephone Encounter (Signed)
Refill Request.  

## 2018-01-12 ENCOUNTER — Other Ambulatory Visit: Payer: Self-pay | Admitting: Internal Medicine

## 2018-01-12 DIAGNOSIS — I25810 Atherosclerosis of coronary artery bypass graft(s) without angina pectoris: Secondary | ICD-10-CM

## 2018-01-12 DIAGNOSIS — I480 Paroxysmal atrial fibrillation: Secondary | ICD-10-CM

## 2018-01-12 NOTE — Telephone Encounter (Signed)
Please review for refill.  

## 2018-02-01 ENCOUNTER — Encounter: Payer: Self-pay | Admitting: Rheumatology

## 2018-02-02 ENCOUNTER — Ambulatory Visit (INDEPENDENT_AMBULATORY_CARE_PROVIDER_SITE_OTHER): Payer: PPO | Admitting: Family Medicine

## 2018-02-02 ENCOUNTER — Encounter: Payer: Self-pay | Admitting: Family Medicine

## 2018-02-02 VITALS — BP 130/70 | HR 64 | Ht 68.0 in | Wt 201.0 lb

## 2018-02-02 DIAGNOSIS — I25810 Atherosclerosis of coronary artery bypass graft(s) without angina pectoris: Secondary | ICD-10-CM | POA: Diagnosis not present

## 2018-02-02 DIAGNOSIS — I5022 Chronic systolic (congestive) heart failure: Secondary | ICD-10-CM | POA: Diagnosis not present

## 2018-02-02 DIAGNOSIS — I10 Essential (primary) hypertension: Secondary | ICD-10-CM

## 2018-02-02 DIAGNOSIS — D649 Anemia, unspecified: Secondary | ICD-10-CM | POA: Diagnosis not present

## 2018-02-02 DIAGNOSIS — E440 Moderate protein-calorie malnutrition: Secondary | ICD-10-CM

## 2018-02-02 DIAGNOSIS — I480 Paroxysmal atrial fibrillation: Secondary | ICD-10-CM

## 2018-02-02 MED ORDER — METOPROLOL TARTRATE 25 MG PO TABS: ORAL_TABLET | ORAL | 1 refills | Status: DC

## 2018-02-02 MED ORDER — ELIQUIS 5 MG PO TABS: 5.0000 mg | ORAL_TABLET | Freq: Two times a day (BID) | ORAL | 5 refills | Status: DC

## 2018-02-02 NOTE — Progress Notes (Signed)
Date:  02/02/2018   Name:  Joe Kennedy   DOB:  12/04/1940   MRN:  [id number]   Chief Complaint: coronary bypass (needs Eliquis refilled until getting in with cardiology- needs labs) and Hypertension Hypertension  This is a chronic problem. The current episode started more than 1 year ago. The problem is unchanged. The problem is controlled. Pertinent negatives include no anxiety, blurred vision, chest pain, headaches, malaise/fatigue, neck pain, orthopnea, palpitations, peripheral edema, PND, shortness of breath or sweats. There are no associated agents to hypertension. There are no known risk factors for coronary artery disease. Past treatments include beta blockers. The current treatment provides moderate improvement. There are no compliance problems.  Hypertensive end-organ damage includes CAD/MI. There is no history of angina, kidney disease, CVA, heart failure, left ventricular hypertrophy, PVD or retinopathy. Identifiable causes of hypertension include chronic renal disease. There is no history of a hypertension causing med or renovascular disease.  Heart Problem  This is a chronic problem. The current episode started more than 1 year ago. The problem has been gradually improving. Associated symptoms include chills. Pertinent negatives include no abdominal pain, chest pain, coughing, diaphoresis, fever, headaches, myalgias, nausea, neck pain, rash, sore throat or urinary symptoms. The treatment provided moderate relief.     Review of Systems  Constitutional: Positive for chills. Negative for diaphoresis, fever and malaise/fatigue.  HENT: Negative for drooling, ear discharge, ear pain and sore throat.   Eyes: Negative for blurred vision.  Respiratory: Negative for cough, shortness of breath and wheezing.   Cardiovascular: Negative for chest pain, palpitations, orthopnea, leg swelling and PND.  Gastrointestinal: Negative for abdominal pain, blood in stool, constipation, diarrhea and  nausea.  Endocrine: Negative for polydipsia.  Genitourinary: Negative for dysuria, frequency, hematuria and urgency.  Musculoskeletal: Negative for back pain, myalgias and neck pain.  Skin: Negative for rash.  Allergic/Immunologic: Negative for environmental allergies.  Neurological: Negative for dizziness and headaches.  Hematological: Does not bruise/bleed easily.  Psychiatric/Behavioral: Negative for suicidal ideas. The patient is not nervous/anxious.     Patient Active Problem List   Diagnosis Date Noted  . Malnutrition of moderate degree ([place]) 09/02/2014  . CAD (coronary artery disease) 08/31/2014  . CHF exacerbation ([place])   . Severe aortic insufficiency   . Knee pain, acute   . Aortic regurgitation   . Acute and subacute infective endocarditis in diseases classified elsewhere   . Protein-calorie malnutrition, severe ([place]) 07/08/2014  . Viridans streptococci infection 07/08/2014  . Unintentional weight loss 07/08/2014  . Acute pain of right knee 07/08/2014  . Normocytic anemia 07/08/2014  . Hypertension 07/08/2014  . Coronary artery disease 07/08/2014  . Dyslipidemia 07/08/2014  . NSTEMI (non-ST elevated myocardial infarction) ([place]) 07/06/2014  . Bacteremia 07/06/2014  . Atrial fibrillation ([place]) 07/06/2014  . Chronic systolic CHF (congestive heart failure) ([place]) 07/06/2014  . Right lower lobe pneumonia ([place]) 07/06/2014    Allergies  Allergen Reactions  . Lipitor [Atorvastatin] Other (See Comments)    Abdominal pain, nausea and chest pain  . Penicillins     Difficulty breathing    Past Surgical History:  Procedure Laterality Date  . AORTIC VALVE REPLACEMENT N/A 08/31/2014   Procedure: AORTIC VALVE REPLACEMENT (AVR);  Surgeon: Gaye Pollack, MD;  Location: [facility];  Service: Open Heart Surgery;  Laterality: N/A;  . CORONARY ARTERY BYPASS GRAFT N/A 08/31/2014   Procedure: CORONARY ARTERY BYPASS GRAFTING (CABG) x six, using left internal mammary artery and right leg  greater saphenous  vein harvested endoscopically;  Surgeon: Gaye Pollack, MD;  Location: [facility];  Service: Open Heart Surgery;  Laterality: N/A;  . MULTIPLE EXTRACTIONS WITH ALVEOLOPLASTY N/A 07/20/2014   Procedure: Extraction of tooth #'s 2,6,8,14,21,22,23,27,28,30 with alveoloplasty ;  Surgeon: Lenn Cal, DDS;  Location: [facility];  Service: Oral Surgery;  Laterality: N/A;  . TEE WITHOUT CARDIOVERSION N/A 07/11/2014   Procedure: TRANSESOPHAGEAL ECHOCARDIOGRAM (TEE);  Surgeon: Josue Hector, MD;  Location: [facility];  Service: Cardiovascular;  Laterality: N/A;  . TEE WITHOUT CARDIOVERSION N/A 08/31/2014   Procedure: TRANSESOPHAGEAL ECHOCARDIOGRAM (TEE);  Surgeon: Gaye Pollack, MD;  Location: [facility];  Service: Open Heart Surgery;  Laterality: N/A;  . TONSILLECTOMY      Social History   Tobacco Use  . Smoking status: Never Smoker  . Smokeless tobacco: Never Used  . Tobacco comment: smoking cessation materials not required  Substance Use Topics  . Alcohol use: No    Alcohol/week: 1.0 standard drinks    Types: 1 Cans of beer per week  . Drug use: No     Medication list has been reviewed and updated.  Current Meds  Medication Sig  . aspirin EC 81 MG tablet Take 1 tablet (81 mg total) by mouth daily.  Marland Kitchen ELIQUIS 5 MG TABS tablet Take 1 tablet (5 mg total) by mouth 2 (two) times daily.  . ferrous sulfate 325 (65 FE) MG tablet Take 325 mg by mouth daily with breakfast.  . metoprolol tartrate (LOPRESSOR) 25 MG tablet TAKE 1/2 (12.5 MG TOTAL) 2 (TWO) TIMES DAILY BY MOUTH.  . [DISCONTINUED] ELIQUIS 5 MG TABS tablet TAKE 1 TABLET BY MOUTH TWICE A DAY  . [DISCONTINUED] metoprolol tartrate (LOPRESSOR) 25 MG tablet TAKE 1/2 (12.5 MG TOTAL) 2 (TWO) TIMES DAILY BY MOUTH.    PHQ 2/9 Scores 02/02/2018 08/24/2017 02/23/2017 03/15/2015  PHQ - 2 Score 0 0 0 0  PHQ- 9 Score 0 0 3 -    Physical Exam  Constitutional: He is oriented to person, place, and time.  HENT:  Head: Normocephalic.    Right Ear: External ear normal.  Left Ear: External ear normal.  Nose: Nose normal.  Mouth/Throat: Oropharynx is clear and moist.  Eyes: Pupils are equal, round, and reactive to light. Conjunctivae and EOM are normal. Right eye exhibits no discharge. Left eye exhibits no discharge. No scleral icterus.  Neck: Normal range of motion. Neck supple. No JVD present. No tracheal deviation present. No thyromegaly present.  Cardiovascular: Normal rate, regular rhythm, S1 normal, S2 normal, normal heart sounds and intact distal pulses. Exam reveals no gallop, no S3, no S4, no distant heart sounds and no friction rub.  No murmur heard.  No systolic murmur is present.  No diastolic murmur is present. Pulmonary/Chest: Breath sounds normal. No respiratory distress. He has no wheezes. He has no rales.  Abdominal: Soft. Bowel sounds are normal. He exhibits no mass. There is no hepatosplenomegaly. There is no tenderness. There is no rebound, no guarding and no CVA tenderness.  Musculoskeletal: Normal range of motion. He exhibits no edema or tenderness.  Lymphadenopathy:    He has no cervical adenopathy.  Neurological: He is alert and oriented to person, place, and time. He has normal strength and normal reflexes. No cranial nerve deficit.  Skin: Skin is warm. No rash noted.  Nursing note and vitals reviewed.   BP 130/70   Pulse 64   Ht 5\' 8"  (1.727 m)   Wt 201 lb (91.2 kg)  BMI 30.56 kg/m   Assessment and Plan:  1. Coronary artery disease involving coronary bypass graft of native heart without angina pectoris Refill Eliquis- stable on meds/ get back in with Dr End - ELIQUIS 5 MG TABS tablet; Take 1 tablet (5 mg total) by mouth 2 (two) times daily.  Dispense: 30 tablet; Refill: 5  2. Paroxysmal atrial fibrillation (HCC) Refilled Eliquis and metoprolol- stable on meds/ return to Dr End (cardio) - ELIQUIS 5 MG TABS tablet; Take 1 tablet (5 mg total) by mouth 2 (two) times daily.  Dispense: 30  tablet; Refill: 5 - metoprolol tartrate (LOPRESSOR) 25 MG tablet; TAKE 1/2 (12.5 MG TOTAL) 2 (TWO) TIMES DAILY BY MOUTH.  Dispense: 90 tablet; Refill: 1  3. Essential hypertension Stable on med- refill metoprolol/ draw renal panel - metoprolol tartrate (LOPRESSOR) 25 MG tablet; TAKE 1/2 (12.5 MG TOTAL) 2 (TWO) TIMES DAILY BY MOUTH.  Dispense: 90 tablet; Refill: 1 - Renal Function Panel  4. Malnutrition of moderate degree (HCC) Encouraged meditteranian diet  5. Chronic systolic CHF (congestive heart failure) (HCC) stable  6. Anemia, unspecified type Continue otc ferrous sulfate/ draw cbc - CBC with Differential/Platelet  Cleaned abrasion with saline solution and applied dressing Dr. [facility]  02/02/2018

## 2018-02-03 LAB — CBC WITH DIFFERENTIAL/PLATELET
Basophils Absolute: 0.1 10*3/uL (ref 0.0–0.2)
Basos: 1 %
EOS (ABSOLUTE): 0.2 10*3/uL (ref 0.0–0.4)
Eos: 3 %
Hematocrit: 34.2 % — ABNORMAL LOW (ref 37.5–51.0)
Hemoglobin: 9.5 g/dL — ABNORMAL LOW (ref 13.0–17.7)
IMMATURE GRANULOCYTES: 0 %
Immature Grans (Abs): 0 10*3/uL (ref 0.0–0.1)
Lymphocytes Absolute: 1.3 10*3/uL (ref 0.7–3.1)
Lymphs: 20 %
MCH: 20.7 pg — ABNORMAL LOW (ref 26.6–33.0)
MCHC: 27.8 g/dL — AB (ref 31.5–35.7)
MCV: 74 fL — ABNORMAL LOW (ref 79–97)
MONOS ABS: 0.6 10*3/uL (ref 0.1–0.9)
Monocytes: 10 %
NEUTROS PCT: 66 %
Neutrophils Absolute: 4.3 10*3/uL (ref 1.4–7.0)
Platelets: 328 10*3/uL (ref 150–450)
RBC: 4.6 x10E6/uL (ref 4.14–5.80)
RDW: 17.3 % — AB (ref 12.3–15.4)
WBC: 6.6 10*3/uL (ref 3.4–10.8)

## 2018-02-03 LAB — RENAL FUNCTION PANEL
Albumin: 4.1 g/dL (ref 3.5–4.8)
BUN / CREAT RATIO: 13 (ref 10–24)
BUN: 19 mg/dL (ref 8–27)
CO2: 22 mmol/L (ref 20–29)
Calcium: 8.9 mg/dL (ref 8.6–10.2)
Chloride: 106 mmol/L (ref 96–106)
Creatinine, Ser: 1.5 mg/dL — ABNORMAL HIGH (ref 0.76–1.27)
GFR calc non Af Amer: 44 mL/min/{1.73_m2} — ABNORMAL LOW (ref >59)
GFR, EST AFRICAN AMERICAN: 51 mL/min/{1.73_m2} — AB (ref >59)
Glucose: 86 mg/dL (ref 65–99)
Phosphorus: 3.2 mg/dL (ref 2.5–4.5)
Potassium: 4.5 mmol/L (ref 3.5–5.2)
SODIUM: 139 mmol/L (ref 134–144)

## 2018-02-26 LAB — FERRITIN: FERRITIN: 10 ng/mL — AB (ref 30–400)

## 2018-02-26 LAB — SPECIMEN STATUS REPORT

## 2018-03-29 ENCOUNTER — Telehealth: Payer: Self-pay

## 2018-03-29 NOTE — Telephone Encounter (Signed)
Pt is supposed to go to the urgent care in [place] for a CBC and Iron check due to anemia. I called pt and "couldn't leave a message due to mailbox hasn't been set up". Called Docia Barrier (wife)- LM stating the above and repeated the tests he needs and why twice on voicemail.

## 2018-05-22 ENCOUNTER — Other Ambulatory Visit: Payer: Self-pay | Admitting: Family Medicine

## 2018-05-22 DIAGNOSIS — I480 Paroxysmal atrial fibrillation: Secondary | ICD-10-CM

## 2018-05-22 DIAGNOSIS — I25810 Atherosclerosis of coronary artery bypass graft(s) without angina pectoris: Secondary | ICD-10-CM

## 2018-06-22 ENCOUNTER — Other Ambulatory Visit: Payer: Self-pay | Admitting: Family Medicine

## 2018-06-22 DIAGNOSIS — I480 Paroxysmal atrial fibrillation: Secondary | ICD-10-CM

## 2018-06-22 DIAGNOSIS — I25810 Atherosclerosis of coronary artery bypass graft(s) without angina pectoris: Secondary | ICD-10-CM

## 2018-07-24 ENCOUNTER — Other Ambulatory Visit: Payer: Self-pay | Admitting: Family Medicine

## 2018-07-24 DIAGNOSIS — I25810 Atherosclerosis of coronary artery bypass graft(s) without angina pectoris: Secondary | ICD-10-CM

## 2018-07-24 DIAGNOSIS — I480 Paroxysmal atrial fibrillation: Secondary | ICD-10-CM

## 2018-08-10 ENCOUNTER — Ambulatory Visit (INDEPENDENT_AMBULATORY_CARE_PROVIDER_SITE_OTHER): Payer: PPO | Admitting: Family Medicine

## 2018-08-10 ENCOUNTER — Encounter: Payer: Self-pay | Admitting: Family Medicine

## 2018-08-10 VITALS — Ht 68.0 in | Wt 200.0 lb

## 2018-08-10 DIAGNOSIS — D508 Other iron deficiency anemias: Secondary | ICD-10-CM

## 2018-08-10 DIAGNOSIS — I25810 Atherosclerosis of coronary artery bypass graft(s) without angina pectoris: Secondary | ICD-10-CM | POA: Diagnosis not present

## 2018-08-10 DIAGNOSIS — I10 Essential (primary) hypertension: Secondary | ICD-10-CM | POA: Diagnosis not present

## 2018-08-10 DIAGNOSIS — I480 Paroxysmal atrial fibrillation: Secondary | ICD-10-CM

## 2018-08-10 MED ORDER — ELIQUIS 5 MG PO TABS: 5.0000 mg | ORAL_TABLET | Freq: Two times a day (BID) | ORAL | 1 refills | Status: DC

## 2018-08-10 MED ORDER — METOPROLOL TARTRATE 25 MG PO TABS: ORAL_TABLET | ORAL | 1 refills | Status: DC

## 2018-08-10 MED ORDER — FERROUS SULFATE 325 (65 FE) MG PO TABS: 325.0000 mg | ORAL_TABLET | Freq: Every day | ORAL | 1 refills | Status: AC

## 2018-08-10 NOTE — Progress Notes (Signed)
Date:  08/10/2018   Name:  Joe Kennedy   DOB:  Jul 30, 1940   MRN:  [id number]   Chief Complaint: Hypertension; Anemia; and myocardial infarction (takes eliquis)  I connected withthis patient, Joe Kennedy, by telephoneat the patient's home/[place].  I verified that I am speaking with the correct person using two identifiers. This visit was conducted via telephone due to the Covid-19 outbreak from my office at [facility] in [place], [place]. I discussed the limitations, risks, security and privacy concerns of performing an evaluation and management service by telephone. I also discussed with the patient that there may be a patient responsible charge related to this service. The patient expressed understanding and agreed to proceed.  Hypertension  This is a chronic problem. The current episode started more than 1 year ago. The problem is unchanged. The problem is controlled. Pertinent negatives include no anxiety, blurred vision, chest pain, headaches, malaise/fatigue, neck pain, orthopnea, palpitations, peripheral edema, PND, shortness of breath or sweats. There are no associated agents to hypertension. There are no known risk factors for coronary artery disease. The current treatment provides moderate improvement. There are no compliance problems.  There is no history of angina, kidney disease, CAD/MI, CVA, heart failure, left ventricular hypertrophy, PVD or retinopathy. There is no history of chronic renal disease, a hypertension causing med or renovascular disease.  Anemia  Presents for follow-up visit. There has been no abdominal pain, anorexia, bruising/bleeding easily, confusion, fever, leg swelling, light-headedness, malaise/fatigue, pallor, palpitations, paresthesias or weight loss. Signs of blood loss that are not present include hematemesis, hematochezia, melena, menorrhagia and vaginal bleeding. There is no history of chronic renal disease or heart failure. There are no compliance  problems.   Heart Problem  This is a chronic problem. The current episode started more than 1 year ago. The problem has been gradually improving. Pertinent negatives include no abdominal pain, anorexia, chest pain, chills, coughing, fatigue, fever, headaches, myalgias, nausea, neck pain, rash, sore throat or visual change.    Review of Systems  Constitutional: Negative for chills, fatigue, fever, malaise/fatigue and weight loss.  HENT: Negative for drooling, ear discharge, ear pain, mouth sores and sore throat.   Eyes: Negative for blurred vision.  Respiratory: Negative for cough, shortness of breath and wheezing.   Cardiovascular: Negative for chest pain, palpitations, orthopnea, leg swelling and PND.  Gastrointestinal: Negative for abdominal pain, anorexia, blood in stool, constipation, diarrhea, hematemesis, hematochezia, melena and nausea.  Endocrine: Negative for polydipsia.  Genitourinary: Negative for dysuria, frequency, hematuria, menorrhagia, urgency and vaginal bleeding.  Musculoskeletal: Negative for back pain, myalgias and neck pain.  Skin: Negative for pallor and rash.  Allergic/Immunologic: Negative for environmental allergies.  Neurological: Negative for dizziness, light-headedness, headaches and paresthesias.  Hematological: Does not bruise/bleed easily.  Psychiatric/Behavioral: Negative for confusion and suicidal ideas. The patient is not nervous/anxious.     Patient Active Problem List   Diagnosis Date Noted  . Malnutrition of moderate degree ([place]) 09/02/2014  . CAD (coronary artery disease) 08/31/2014  . CHF exacerbation ([place])   . Severe aortic insufficiency   . Knee pain, acute   . Aortic regurgitation   . Acute and subacute infective endocarditis in diseases classified elsewhere   . Protein-calorie malnutrition, severe ([place]) 07/08/2014  . Viridans streptococci infection 07/08/2014  . Unintentional weight loss 07/08/2014  . Acute pain of right knee 07/08/2014  .  Normocytic anemia 07/08/2014  . Hypertension 07/08/2014  . Coronary artery disease 07/08/2014  . Dyslipidemia 07/08/2014  .  NSTEMI (non-ST elevated myocardial infarction) ([place]) 07/06/2014  . Bacteremia 07/06/2014  . Atrial fibrillation ([place]) 07/06/2014  . Chronic systolic CHF (congestive heart failure) ([place]) 07/06/2014  . Right lower lobe pneumonia ([place]) 07/06/2014    Allergies  Allergen Reactions  . Lipitor [Atorvastatin] Other (See Comments)    Abdominal pain, nausea and chest pain  . Penicillins     Difficulty breathing    Past Surgical History:  Procedure Laterality Date  . AORTIC VALVE REPLACEMENT N/A 08/31/2014   Procedure: AORTIC VALVE REPLACEMENT (AVR);  Surgeon: Gaye Pollack, MD;  Location: [facility];  Service: Open Heart Surgery;  Laterality: N/A;  . CORONARY ARTERY BYPASS GRAFT N/A 08/31/2014   Procedure: CORONARY ARTERY BYPASS GRAFTING (CABG) x six, using left internal mammary artery and right leg greater saphenous vein harvested endoscopically;  Surgeon: Gaye Pollack, MD;  Location: [facility];  Service: Open Heart Surgery;  Laterality: N/A;  . MULTIPLE EXTRACTIONS WITH ALVEOLOPLASTY N/A 07/20/2014   Procedure: Extraction of tooth #'s 2,6,8,14,21,22,23,27,28,30 with alveoloplasty ;  Surgeon: Lenn Cal, DDS;  Location: [facility];  Service: Oral Surgery;  Laterality: N/A;  . TEE WITHOUT CARDIOVERSION N/A 07/11/2014   Procedure: TRANSESOPHAGEAL ECHOCARDIOGRAM (TEE);  Surgeon: Josue Hector, MD;  Location: [facility];  Service: Cardiovascular;  Laterality: N/A;  . TEE WITHOUT CARDIOVERSION N/A 08/31/2014   Procedure: TRANSESOPHAGEAL ECHOCARDIOGRAM (TEE);  Surgeon: Gaye Pollack, MD;  Location: [facility];  Service: Open Heart Surgery;  Laterality: N/A;  . TONSILLECTOMY      Social History   Tobacco Use  . Smoking status: Never Smoker  . Smokeless tobacco: Never Used  . Tobacco comment: smoking cessation materials not required  Substance Use Topics  . Alcohol use: No     Alcohol/week: 1.0 standard drinks    Types: 1 Cans of beer per week  . Drug use: No     Medication list has been reviewed and updated.  Current Meds  Medication Sig  . aspirin EC 81 MG tablet Take 1 tablet (81 mg total) by mouth daily.  Marland Kitchen ELIQUIS 5 MG TABS tablet TAKE 1 TABLET BY MOUTH TWICE A DAY  . ferrous sulfate 325 (65 FE) MG tablet Take 325 mg by mouth daily with breakfast.  . metoprolol tartrate (LOPRESSOR) 25 MG tablet TAKE 1/2 (12.5 MG TOTAL) 2 (TWO) TIMES DAILY BY MOUTH.    PHQ 2/9 Scores 08/10/2018 02/02/2018 08/24/2017 02/23/2017  PHQ - 2 Score 0 0 0 0  PHQ- 9 Score 0 0 0 3    BP Readings from Last 3 Encounters:  02/02/18 130/70  08/24/17 136/80  02/23/17 120/78    Physical Exam Vitals signs and nursing note reviewed.     Wt Readings from Last 3 Encounters:  08/10/18 200 lb (90.7 kg)  02/02/18 201 lb (91.2 kg)  08/24/17 203 lb (92.1 kg)    Ht 5\' 8"  (1.727 m)   Wt 200 lb (90.7 kg)   BMI 30.41 kg/m   Assessment and Plan:  1. Essential hypertension Chronic.  Controlled.  Patient will continue metoprolol 25 mg 1/2 tablet twice a day.  Patient will be going to check in with the clinic to evaluate his blood pressure in the not too distant future in [place] he promised - metoprolol tartrate (LOPRESSOR) 25 MG tablet; TAKE 1/2 (12.5 MG TOTAL) 2 (TWO) TIMES DAILY BY MOUTH.  Dispense: 90 tablet; Refill: 1  2. Paroxysmal atrial fibrillation (HCC) Fibrillation is chronic currently it is stable currently patient with no  episodes of tachyarrhythmia patient will continue his Eliquis on a twice a day dosing as well as Lopressor for rate control he is on Eliquis 5 mg twice a day. - metoprolol tartrate (LOPRESSOR) 25 MG tablet; TAKE 1/2 (12.5 MG TOTAL) 2 (TWO) TIMES DAILY BY MOUTH.  Dispense: 90 tablet; Refill: 1 - ELIQUIS 5 MG TABS tablet; Take 1 tablet (5 mg total) by mouth 2 (two) times daily.  Dispense: 180 tablet; Refill: 1  3. Coronary artery disease involving  coronary bypass graft of native heart without angina pectoris Patient is currently stable with his coronary artery disease but no episodes of chest pain dyspnea on exertion nor any S/syncopal episodes.  Patient is continuing his Eliquis to keep his stents open and the counter his atrial fibrillation. - ELIQUIS 5 MG TABS tablet; Take 1 tablet (5 mg total) by mouth 2 (two) times daily.  Dispense: 180 tablet; Refill: 1  4. Iron deficiency anemia secondary to inadequate dietary iron intake Patient has an iron deficiency anemia that he is supposed to be taking iron supplement.  Patient years me that he is taking his medications as directed will continue to do so in the future.  He assures me he will go see clinic to have his blood counts checked in the not too distant future as well. - ferrous sulfate 325 (65 FE) MG tablet; Take 1 tablet (325 mg total) by mouth daily with breakfast.  Dispense: 90 tablet; Refill: 1

## 2018-08-24 ENCOUNTER — Other Ambulatory Visit: Payer: Self-pay | Admitting: Family Medicine

## 2018-08-24 DIAGNOSIS — I480 Paroxysmal atrial fibrillation: Secondary | ICD-10-CM

## 2018-08-24 DIAGNOSIS — I10 Essential (primary) hypertension: Secondary | ICD-10-CM

## 2018-08-25 ENCOUNTER — Ambulatory Visit (INDEPENDENT_AMBULATORY_CARE_PROVIDER_SITE_OTHER): Payer: PPO

## 2018-08-25 VITALS — Ht 68.0 in | Wt 200.0 lb

## 2018-08-25 DIAGNOSIS — Z0000 Encounter for general adult medical examination without abnormal findings: Secondary | ICD-10-CM | POA: Diagnosis not present

## 2018-08-25 NOTE — Patient Instructions (Signed)
Joe Kennedy , Thank you for taking time to come for your Medicare Wellness Visit. I appreciate your ongoing commitment to your health goals. Please review the following plan we discussed and let me know if I can assist you in the future.   Screening recommendations/referrals: Colonoscopy: no longer required Recommended yearly ophthalmology/optometry visit for glaucoma screening and checkup Recommended yearly dental visit for hygiene and checkup  Vaccinations: Influenza vaccine: done 01/01/18 Pneumococcal vaccine: done 08/24/17 due for second dose Tdap vaccine: due - please contact us if you get a cut or scrape Shingles vaccine: Shingrix discussed. Please contact your pharmacy for coverage information.    Conditions/risks identified: Keep up the great work!  Next appointment: Please follow up in one year for your Medicare Annual Wellness visit.    Preventive Care 55 Years and Older, Male Preventive care refers to lifestyle choices and visits with your health care provider that can promote health and wellness. What does preventive care include?  A yearly physical exam. This is also called an annual well check.  Dental exams once or twice a year.  Routine eye exams. Ask your health care provider how often you should have your eyes checked.  Personal lifestyle choices, including:  Daily care of your teeth and gums.  Regular physical activity.  Eating a healthy diet.  Avoiding tobacco and drug use.  Limiting alcohol use.  Practicing safe sex.  Taking low doses of aspirin every day.  Taking vitamin and mineral supplements as recommended by your health care provider. What happens during an annual well check? The services and screenings done by your health care provider during your annual well check will depend on your age, overall health, lifestyle risk factors, and family history of disease. Counseling  Your health care provider may ask you questions about your:  Alcohol use.   Tobacco use.  Drug use.  Emotional well-being.  Home and relationship well-being.  Sexual activity.  Eating habits.  History of falls.  Memory and ability to understand (cognition).  Work and work Statistician. Screening  You may have the following tests or measurements:  Height, weight, and BMI.  Blood pressure.  Lipid and cholesterol levels. These may be checked every 5 years, or more frequently if you are over 38 years old.  Skin check.  Lung cancer screening. You may have this screening every year starting at age 20 if you have a 30-pack-year history of smoking and currently smoke or have quit within the past 15 years.  Fecal occult blood test (FOBT) of the stool. You may have this test every year starting at age 13.  Flexible sigmoidoscopy or colonoscopy. You may have a sigmoidoscopy every 5 years or a colonoscopy every 10 years starting at age 2.  Prostate cancer screening. Recommendations will vary depending on your family history and other risks.  Hepatitis C blood test.  Hepatitis B blood test.  Sexually transmitted disease (STD) testing.  Diabetes screening. This is done by checking your blood sugar (glucose) after you have not eaten for a while (fasting). You may have this done every 1-3 years.  Abdominal aortic aneurysm (AAA) screening. You may need this if you are a current or former smoker.  Osteoporosis. You may be screened starting at age 26 if you are at high risk. Talk with your health care provider about your test results, treatment options, and if necessary, the need for more tests. Vaccines  Your health care provider may recommend certain vaccines, such as:  Influenza vaccine. This  is recommended every year.  Tetanus, diphtheria, and acellular pertussis (Tdap, Td) vaccine. You may need a Td booster every 10 years.  Zoster vaccine. You may need this after age 79.  Pneumococcal 13-valent conjugate (PCV13) vaccine. One dose is recommended  after age 27.  Pneumococcal polysaccharide (PPSV23) vaccine. One dose is recommended after age 67. Talk to your health care provider about which screenings and vaccines you need and how often you need them. This information is not intended to replace advice given to you by your health care provider. Make sure you discuss any questions you have with your health care provider. Document Released: 04/20/2015 Document Revised: 12/12/2015 Document Reviewed: 01/23/2015 Elsevier Interactive Patient Education  2017 [place] Prevention in the Home Falls can cause injuries. They can happen to people of all ages. There are many things you can do to make your home safe and to help prevent falls. What can I do on the outside of my home?  Regularly fix the edges of walkways and driveways and fix any cracks.  Remove anything that might make you trip as you walk through a door, such as a raised step or threshold.  Trim any bushes or trees on the path to your home.  Use bright outdoor lighting.  Clear any walking paths of anything that might make someone trip, such as rocks or tools.  Regularly check to see if handrails are loose or broken. Make sure that both sides of any steps have handrails.  Any raised decks and porches should have guardrails on the edges.  Have any leaves, snow, or ice cleared regularly.  Use sand or salt on walking paths during winter.  Clean up any spills in your garage right away. This includes oil or grease spills. What can I do in the bathroom?  Use night lights.  Install grab bars by the toilet and in the tub and shower. Do not use towel bars as grab bars.  Use non-skid mats or decals in the tub or shower.  If you need to sit down in the shower, use a plastic, non-slip stool.  Keep the floor dry. Clean up any water that spills on the floor as soon as it happens.  Remove soap buildup in the tub or shower regularly.  Attach bath mats securely with  double-sided non-slip rug tape.  Do not have throw rugs and other things on the floor that can make you trip. What can I do in the bedroom?  Use night lights.  Make sure that you have a light by your bed that is easy to reach.  Do not use any sheets or blankets that are too big for your bed. They should not hang down onto the floor.  Have a firm chair that has side arms. You can use this for support while you get dressed.  Do not have throw rugs and other things on the floor that can make you trip. What can I do in the kitchen?  Clean up any spills right away.  Avoid walking on wet floors.  Keep items that you use a lot in easy-to-reach places.  If you need to reach something above you, use a strong step stool that has a grab bar.  Keep electrical cords out of the way.  Do not use floor polish or wax that makes floors slippery. If you must use wax, use non-skid floor wax.  Do not have throw rugs and other things on the floor that can make you  trip. What can I do with my stairs?  Do not leave any items on the stairs.  Make sure that there are handrails on both sides of the stairs and use them. Fix handrails that are broken or loose. Make sure that handrails are as long as the stairways.  Check any carpeting to make sure that it is firmly attached to the stairs. Fix any carpet that is loose or worn.  Avoid having throw rugs at the top or bottom of the stairs. If you do have throw rugs, attach them to the floor with carpet tape.  Make sure that you have a light switch at the top of the stairs and the bottom of the stairs. If you do not have them, ask someone to add them for you. What else can I do to help prevent falls?  Wear shoes that:  Do not have high heels.  Have rubber bottoms.  Are comfortable and fit you well.  Are closed at the toe. Do not wear sandals.  If you use a stepladder:  Make sure that it is fully opened. Do not climb a closed stepladder.  Make  sure that both sides of the stepladder are locked into place.  Ask someone to hold it for you, if possible.  Clearly mark and make sure that you can see:  Any grab bars or handrails.  First and last steps.  Where the edge of each step is.  Use tools that help you move around (mobility aids) if they are needed. These include:  Canes.  Walkers.  Scooters.  Crutches.  Turn on the lights when you go into a dark area. Replace any light bulbs as soon as they burn out.  Set up your furniture so you have a clear path. Avoid moving your furniture around.  If any of your floors are uneven, fix them.  If there are any pets around you, be aware of where they are.  Review your medicines with your doctor. Some medicines can make you feel dizzy. This can increase your chance of falling. Ask your doctor what other things that you can do to help prevent falls. This information is not intended to replace advice given to you by your health care provider. Make sure you discuss any questions you have with your health care provider. Document Released: 01/18/2009 Document Revised: 08/30/2015 Document Reviewed: 04/28/2014 Elsevier Interactive Patient Education  2017 [employer].

## 2018-08-25 NOTE — Progress Notes (Signed)
Subjective:   Joe Kennedy is a 78 y.o. male who presents for Medicare Annual/Subsequent preventive examination.  Virtual Visit via Telephone Note  I connected with Joe Kennedy on 08/25/18 at  2:00 PM EDT by telephone and verified that I am speaking with the correct person using two identifiers.  Medicare Annual Wellness visit completed telephonically due to Covid-19 pandemic.  Location: Patient: home Provider: office   I discussed the limitations, risks, security and privacy concerns of performing an evaluation and management service by telephone and the availability of in person appointments. The patient expressed understanding and agreed to proceed.  Some vital signs may be absent or patient reported. Pt did not have a blood pressure monitor or scale to record those readings.   Clemetine Marker, LPN  Review of Systems:   Cardiac Risk Factors include: advanced age (>16men, >23 women);male gender;obesity (BMI >30kg/m2);hypertension     Objective:    Vitals: Ht 5\' 8"  (1.727 m)   Wt 200 lb (90.7 kg)   BMI 30.41 kg/m   Body mass index is 30.41 kg/m.  Advanced Directives 08/25/2018 08/24/2017 03/15/2015 09/02/2014 08/28/2014 08/20/2014 07/27/2014  Does Patient Have a Medical Advance Directive? Yes Yes Yes Yes Yes Yes Yes  Type of Advance Directive Living will;Healthcare Power of [place];Living will Living will Healthcare Power of Attorney Living will;Healthcare Power of [place] will  Does patient want to make changes to medical advance directive? - - - No - Patient declined No - Patient declined - -  Copy of [place] in Chart? Yes - validated most recent copy scanned in chart (See row information) Yes No - copy requested - Yes - No - copy requested  Would patient like information on creating a medical advance directive? - - - - - - -    Tobacco Social History   Tobacco Use  Smoking Status  Never Smoker  Smokeless Tobacco Never Used  Tobacco Comment   smoking cessation materials not required     Counseling given: Not Answered Comment: smoking cessation materials not required   Clinical Intake:  Pre-visit preparation completed: Yes  Pain : No/denies pain     BMI - recorded: 30.41 Nutritional Status: BMI > 30  Obese Nutritional Risks: None Diabetes: No  How often do you need to have someone help you when you read instructions, pamphlets, or other written materials from your doctor or pharmacy?: 1 - Never  Interpreter Needed?: No  Information entered by :: Clemetine Marker LPN  Past Medical History:  Diagnosis Date  . Arthritis    KNEES  . Blood in stool   . Catarrhal bronchitis   . CHF (congestive heart failure) ([place])   . Elevated PSA   . Fatigue   . GERD (gastroesophageal reflux disease)    occ  . Heart murmur   . HLD (hyperlipidemia)   . Hypertension   . Myocardial infarction ([place])   . Nocturia   . Obesity   . Pneumonia    hx 3/15  . Renal insufficiency   . Scabies   . Shortness of breath dyspnea   . Status post PICC central line placement    placed for vanco infusion rt arm   Past Surgical History:  Procedure Laterality Date  . AORTIC VALVE REPLACEMENT N/A 08/31/2014   Procedure: AORTIC VALVE REPLACEMENT (AVR);  Surgeon: Gaye Pollack, MD;  Location: [facility];  Service: Open Heart Surgery;  Laterality: N/A;  .  CORONARY ARTERY BYPASS GRAFT N/A 08/31/2014   Procedure: CORONARY ARTERY BYPASS GRAFTING (CABG) x six, using left internal mammary artery and right leg greater saphenous vein harvested endoscopically;  Surgeon: Gaye Pollack, MD;  Location: [facility];  Service: Open Heart Surgery;  Laterality: N/A;  . MULTIPLE EXTRACTIONS WITH ALVEOLOPLASTY N/A 07/20/2014   Procedure: Extraction of tooth #'s 2,6,8,14,21,22,23,27,28,30 with alveoloplasty ;  Surgeon: Lenn Cal, DDS;  Location: [facility];  Service: Oral Surgery;  Laterality: N/A;  . TEE WITHOUT  CARDIOVERSION N/A 07/11/2014   Procedure: TRANSESOPHAGEAL ECHOCARDIOGRAM (TEE);  Surgeon: Josue Hector, MD;  Location: [facility];  Service: Cardiovascular;  Laterality: N/A;  . TEE WITHOUT CARDIOVERSION N/A 08/31/2014   Procedure: TRANSESOPHAGEAL ECHOCARDIOGRAM (TEE);  Surgeon: Gaye Pollack, MD;  Location: [facility];  Service: Open Heart Surgery;  Laterality: N/A;  . TONSILLECTOMY     Family History  Problem Relation Age of Onset  . Healthy Mother   . Healthy Father   . Healthy Sister   . Coronary artery disease Brother   . Healthy Sister    Social History   Socioeconomic History  . Marital status: Married    Spouse name: Not on file  . Number of children: 5  . Years of education: Not on file  . Highest education level: 12th grade  Occupational History  . Occupation: retired  Scientific laboratory technician  . Financial resource strain: Not hard at all  . Food insecurity:    Worry: Never true    Inability: Never true  . Transportation needs:    Medical: No    Non-medical: No  Tobacco Use  . Smoking status: Never Smoker  . Smokeless tobacco: Never Used  . Tobacco comment: smoking cessation materials not required  Substance and Sexual Activity  . Alcohol use: No  . Drug use: No  . Sexual activity: Not Currently  Lifestyle  . Physical activity:    Days per week: 7 days    Minutes per session: 30 min  . Stress: Not at all  Relationships  . Social connections:    Talks on phone: Three times a week    Gets together: Twice a week    Attends religious service: More than 4 times per year    Active member of club or organization: No    Attends meetings of clubs or organizations: Never    Relationship status: Married  Other Topics Concern  . Not on file  Social History Narrative  . Not on file    Outpatient Encounter Medications as of 08/25/2018  Medication Sig  . aspirin EC 81 MG tablet Take 1 tablet (81 mg total) by mouth daily.  Marland Kitchen ELIQUIS 5 MG TABS tablet Take 1 tablet (5 mg total)  by mouth 2 (two) times daily.  . ferrous sulfate 325 (65 FE) MG tablet Take 1 tablet (325 mg total) by mouth daily with breakfast. (Patient taking differently: Take 325 mg by mouth 2 (two) times daily with a meal. )  . metoprolol tartrate (LOPRESSOR) 25 MG tablet TAKE 1/2 (12.5 MG TOTAL) 2 (TWO) TIMES DAILY BY MOUTH.   No facility-administered encounter medications on file as of 08/25/2018.     Activities of Daily Living In your present state of health, do you have any difficulty performing the following activities: 08/25/2018  Hearing? N  Comment declines hearing aids  Vision? N  Comment reading glasses  Difficulty concentrating or making decisions? N  Walking or climbing stairs? N  Dressing or bathing?  N  Doing errands, shopping? N  Preparing Food and eating ? N  Using the Toilet? N  In the past six months, have you accidently leaked urine? N  Do you have problems with loss of bowel control? N  Managing your Medications? N  Managing your Finances? N  Housekeeping or managing your Housekeeping? N  Some recent data might be hidden    Patient Care Team: Juline Patch, MD as PCP - General (Family Medicine)   Assessment:   This is a routine wellness examination for [place].  Exercise Activities and Dietary recommendations Current Exercise Habits: Home exercise routine, Time (Minutes): 30, Frequency (Times/Week): 7, Weekly Exercise (Minutes/Week): 210, Intensity: Moderate, Exercise limited by: None identified  Goals    . DIET - INCREASE WATER INTAKE     Recommend to drink at least 6-8 8oz glasses of water per day.       Fall Risk Fall Risk  08/25/2018 08/24/2017 02/23/2017 03/15/2015  Falls in the past year? 0 No No No  Number falls in past yr: 0 - - -  Injury with Fall? 0 - - -  Risk for fall due to : - History of fall(s);Impaired vision - -  Risk for fall due to: Comment - sepsis caused falls; wears eyeglasses - -  Follow up Falls prevention discussed - - -   FALL RISK  PREVENTION PERTAINING TO THE HOME:  Any stairs in or around the home? No  If so, do they handrails? No   Home free of loose throw rugs in walkways, pet beds, electrical cords, etc? Yes  Adequate lighting in your home to reduce risk of falls? Yes   ASSISTIVE DEVICES UTILIZED TO PREVENT FALLS:  Life alert? No  Use of a cane, walker or w/c? No   May use a cane very rarely on uneven ground Grab bars in the bathroom? Yes  Shower chair or bench in shower? No  Elevated toilet seat or a handicapped toilet? No   DME ORDERS:  DME order needed?  No   TIMED UP AND GO:  Was the test performed? No . Telephonic visit.  Education: Fall risk prevention has been discussed.  Intervention(s) required? No   Depression Screen PHQ 2/9 Scores 08/25/2018 08/10/2018 02/02/2018 08/24/2017  PHQ - 2 Score 0 0 0 0  PHQ- 9 Score - 0 0 0    Cognitive Function     6CIT Screen 08/25/2018 08/24/2017  What Year? 4 points 0 points  What month? 0 points 0 points  What time? 0 points 0 points  Count back from 20 0 points 0 points  Months in reverse 4 points 0 points  Repeat phrase 0 points 0 points  Total Score 8 0    Immunization History  Administered Date(s) Administered  . Influenza, High Dose Seasonal PF 01/01/2018  . Influenza,inj,Quad PF,6+ Mos 03/15/2015  . Pneumococcal Conjugate-13 08/24/2017    Qualifies for Shingles Vaccine? Yes . Due for Shingrix. Education has been provided regarding the importance of this vaccine. Pt has been advised to call insurance company to determine out of pocket expense. Advised may also receive vaccine at local pharmacy or Health Dept. Verbalized acceptance and understanding.  Tdap: Although this vaccine is not a covered service during a Wellness Exam, does the patient still wish to receive this vaccine today?  No .  Education has been provided regarding the importance of this vaccine. Advised may receive this vaccine at local pharmacy or Health Dept. Aware to provide  a copy of the vaccination record if obtained from local pharmacy or Health Dept. Verbalized acceptance and understanding.  Flu Vaccine: Up to date  Pneumococcal Vaccine: Due for Pneumococcal vaccine. Does the patient want to receive this vaccine today?  No . Education has been provided regarding the importance of this vaccine but still declined. Advised may receive this vaccine at local pharmacy or Health Dept. Aware to provide a copy of the vaccination record if obtained from local pharmacy or Health Dept. Verbalized acceptance and understanding.   Screening Tests Health Maintenance  Topic Date Due  . PNA vac Low Risk Adult (2 of 2 - PPSV23) 08/25/2018  . TETANUS/TDAP  08/25/2018 (Originally 07/21/1959)  . INFLUENZA VACCINE  11/06/2018   Cancer Screenings:  Colorectal Screening:  No longer required.   Lung Cancer Screening: (Low Dose CT Chest recommended if Age 56-80 years, 30 pack-year currently smoking OR have quit w/in 15years.) does not qualify.   Additional Screening:  Hepatitis C Screening: no longer required  Vision Screening: Recommended annual ophthalmology exams for early detection of glaucoma and other disorders of the eye. Is the patient up to date with their annual eye exam?  No  Who is the provider or what is the name of the office in which the pt attends annual eye exams? [facility]  Dental Screening: Recommended annual dental exams for proper oral hygiene  Community Resource Referral:  CRR required this visit?  No       Plan:    I have personally reviewed and addressed the Medicare Annual Wellness questionnaire and have noted the following in the patient's chart:  A. Medical and social history B. Use of alcohol, tobacco or illicit drugs  C. Current medications and supplements D. Functional ability and status E.  Nutritional status F.  Physical activity G. Advance directives H. List of other physicians I.  Hospitalizations, surgeries, and ER  visits in previous 12 months J.  [place] such as hearing and vision if needed, cognitive and depression L. Referrals and appointments   In addition, I have reviewed and discussed with patient certain preventive protocols, quality metrics, and best practice recommendations. A written personalized care plan for preventive services as well as general preventive health recommendations were provided to patient.   Signed,  Clemetine Marker, LPN Nurse Health Advisor   Nurse Notes: pt states he has not completed labs ordered for anemia due to he is still in [place] and states he has been able to find anywhere open. Advised patient to complete lab work as soon as possible and to please schedule follow up appt in office as soon as he returns to [facility]. Pt voiced understanding.

## 2019-02-19 ENCOUNTER — Other Ambulatory Visit: Payer: Self-pay | Admitting: Family Medicine

## 2019-02-19 DIAGNOSIS — I480 Paroxysmal atrial fibrillation: Secondary | ICD-10-CM

## 2019-02-19 DIAGNOSIS — I25810 Atherosclerosis of coronary artery bypass graft(s) without angina pectoris: Secondary | ICD-10-CM

## 2019-02-21 ENCOUNTER — Other Ambulatory Visit: Payer: Self-pay | Admitting: Family Medicine

## 2019-02-21 DIAGNOSIS — I10 Essential (primary) hypertension: Secondary | ICD-10-CM

## 2019-02-21 DIAGNOSIS — I480 Paroxysmal atrial fibrillation: Secondary | ICD-10-CM

## 2019-02-25 ENCOUNTER — Ambulatory Visit: Payer: Self-pay | Admitting: Family Medicine

## 2019-03-18 ENCOUNTER — Other Ambulatory Visit: Payer: Self-pay | Admitting: Family Medicine

## 2019-03-18 DIAGNOSIS — I480 Paroxysmal atrial fibrillation: Secondary | ICD-10-CM

## 2019-03-18 DIAGNOSIS — I10 Essential (primary) hypertension: Secondary | ICD-10-CM

## 2019-03-22 ENCOUNTER — Other Ambulatory Visit: Payer: Self-pay | Admitting: Family Medicine

## 2019-03-22 DIAGNOSIS — I25810 Atherosclerosis of coronary artery bypass graft(s) without angina pectoris: Secondary | ICD-10-CM

## 2019-03-22 DIAGNOSIS — I480 Paroxysmal atrial fibrillation: Secondary | ICD-10-CM

## 2019-03-27 ENCOUNTER — Other Ambulatory Visit: Payer: Self-pay | Admitting: Family Medicine

## 2019-03-27 DIAGNOSIS — I10 Essential (primary) hypertension: Secondary | ICD-10-CM

## 2019-03-27 DIAGNOSIS — I480 Paroxysmal atrial fibrillation: Secondary | ICD-10-CM

## 2019-03-30 DIAGNOSIS — E669 Obesity, unspecified: Secondary | ICD-10-CM | POA: Diagnosis not present

## 2019-03-30 DIAGNOSIS — K36 Other appendicitis: Secondary | ICD-10-CM | POA: Diagnosis not present

## 2019-03-30 DIAGNOSIS — I10 Essential (primary) hypertension: Secondary | ICD-10-CM | POA: Diagnosis not present

## 2019-03-30 DIAGNOSIS — I4891 Unspecified atrial fibrillation: Secondary | ICD-10-CM | POA: Diagnosis not present

## 2019-03-30 DIAGNOSIS — K7400 Hepatic fibrosis, unspecified: Secondary | ICD-10-CM | POA: Diagnosis not present

## 2019-03-30 DIAGNOSIS — C772 Secondary and unspecified malignant neoplasm of intra-abdominal lymph nodes: Secondary | ICD-10-CM | POA: Diagnosis not present

## 2019-03-30 DIAGNOSIS — A419 Sepsis, unspecified organism: Secondary | ICD-10-CM | POA: Diagnosis not present

## 2019-03-30 DIAGNOSIS — R918 Other nonspecific abnormal finding of lung field: Secondary | ICD-10-CM | POA: Diagnosis not present

## 2019-03-30 DIAGNOSIS — K6389 Other specified diseases of intestine: Secondary | ICD-10-CM | POA: Diagnosis not present

## 2019-03-30 DIAGNOSIS — K5100 Ulcerative (chronic) pancolitis without complications: Secondary | ICD-10-CM | POA: Diagnosis not present

## 2019-03-30 DIAGNOSIS — E872 Acidosis: Secondary | ICD-10-CM | POA: Diagnosis not present

## 2019-03-30 DIAGNOSIS — Z7901 Long term (current) use of anticoagulants: Secondary | ICD-10-CM | POA: Diagnosis not present

## 2019-03-30 DIAGNOSIS — C19 Malignant neoplasm of rectosigmoid junction: Secondary | ICD-10-CM | POA: Diagnosis not present

## 2019-03-30 DIAGNOSIS — K922 Gastrointestinal hemorrhage, unspecified: Secondary | ICD-10-CM | POA: Diagnosis not present

## 2019-03-30 DIAGNOSIS — Z6825 Body mass index (BMI) 25.0-25.9, adult: Secondary | ICD-10-CM | POA: Diagnosis not present

## 2019-03-30 DIAGNOSIS — K388 Other specified diseases of appendix: Secondary | ICD-10-CM | POA: Diagnosis not present

## 2019-03-30 DIAGNOSIS — Z952 Presence of prosthetic heart valve: Secondary | ICD-10-CM | POA: Diagnosis not present

## 2019-03-30 DIAGNOSIS — Z20828 Contact with and (suspected) exposure to other viral communicable diseases: Secondary | ICD-10-CM | POA: Diagnosis not present

## 2019-03-30 DIAGNOSIS — K7689 Other specified diseases of liver: Secondary | ICD-10-CM | POA: Diagnosis not present

## 2019-03-30 DIAGNOSIS — K6289 Other specified diseases of anus and rectum: Secondary | ICD-10-CM | POA: Diagnosis not present

## 2019-03-30 DIAGNOSIS — R109 Unspecified abdominal pain: Secondary | ICD-10-CM | POA: Diagnosis not present

## 2019-03-30 DIAGNOSIS — K56609 Unspecified intestinal obstruction, unspecified as to partial versus complete obstruction: Secondary | ICD-10-CM | POA: Diagnosis not present

## 2019-03-30 DIAGNOSIS — K37 Unspecified appendicitis: Secondary | ICD-10-CM | POA: Diagnosis not present

## 2019-03-30 DIAGNOSIS — E876 Hypokalemia: Secondary | ICD-10-CM | POA: Diagnosis not present

## 2019-03-30 DIAGNOSIS — K5180 Other ulcerative colitis without complications: Secondary | ICD-10-CM | POA: Diagnosis not present

## 2019-03-30 DIAGNOSIS — Z20822 Contact with and (suspected) exposure to covid-19: Secondary | ICD-10-CM | POA: Diagnosis not present

## 2019-03-30 DIAGNOSIS — I38 Endocarditis, valve unspecified: Secondary | ICD-10-CM | POA: Diagnosis not present

## 2019-03-30 DIAGNOSIS — N179 Acute kidney failure, unspecified: Secondary | ICD-10-CM | POA: Diagnosis not present

## 2019-03-30 DIAGNOSIS — Z951 Presence of aortocoronary bypass graft: Secondary | ICD-10-CM | POA: Diagnosis not present

## 2019-04-05 DIAGNOSIS — K388 Other specified diseases of appendix: Secondary | ICD-10-CM | POA: Diagnosis not present

## 2019-04-13 ENCOUNTER — Other Ambulatory Visit: Payer: Self-pay | Admitting: Family Medicine

## 2019-04-13 DIAGNOSIS — I25810 Atherosclerosis of coronary artery bypass graft(s) without angina pectoris: Secondary | ICD-10-CM

## 2019-04-13 DIAGNOSIS — I480 Paroxysmal atrial fibrillation: Secondary | ICD-10-CM

## 2019-08-17 ENCOUNTER — Telehealth: Payer: Self-pay | Admitting: Internal Medicine

## 2019-08-17 NOTE — Telephone Encounter (Signed)
3 attempts to schedule fu appt from recall list.   Deleting recall.   

## 2024-02-06 DEATH — deceased
